# Patient Record
Sex: Female | Born: 1975 | Race: White | Hispanic: No | Marital: Married | Smoking: Never smoker
Health system: Southern US, Community
[De-identification: ages and names within clinical notes are randomized; demographics above are authoritative.]

## PROBLEM LIST (undated history)

## (undated) DIAGNOSIS — F419 Anxiety disorder, unspecified: Secondary | ICD-10-CM

## (undated) DIAGNOSIS — E119 Type 2 diabetes mellitus without complications: Secondary | ICD-10-CM

## (undated) DIAGNOSIS — T8859XA Other complications of anesthesia, initial encounter: Secondary | ICD-10-CM

## (undated) DIAGNOSIS — N189 Chronic kidney disease, unspecified: Secondary | ICD-10-CM

## (undated) DIAGNOSIS — E039 Hypothyroidism, unspecified: Secondary | ICD-10-CM

## (undated) DIAGNOSIS — M1990 Unspecified osteoarthritis, unspecified site: Secondary | ICD-10-CM

## (undated) DIAGNOSIS — R51 Headache: Secondary | ICD-10-CM

## (undated) DIAGNOSIS — Z3480 Encounter for supervision of other normal pregnancy, unspecified trimester: Principal | ICD-10-CM

## (undated) HISTORY — PX: KIDNEY SURGERY: SHX687

## (undated) HISTORY — PX: TONSILLECTOMY: SUR1361

## (undated) HISTORY — PX: CERVICAL ABLATION: SHX5771

---

## 1998-10-25 ENCOUNTER — Other Ambulatory Visit: Admission: RE | Admit: 1998-10-25 | Discharge: 1998-10-25 | Payer: Self-pay | Admitting: Family Medicine

## 1999-11-25 ENCOUNTER — Other Ambulatory Visit: Admission: RE | Admit: 1999-11-25 | Discharge: 1999-11-25 | Payer: Self-pay | Admitting: Family Medicine

## 2000-10-15 ENCOUNTER — Other Ambulatory Visit: Admission: RE | Admit: 2000-10-15 | Discharge: 2000-10-15 | Payer: Self-pay | Admitting: Family Medicine

## 2001-10-19 ENCOUNTER — Other Ambulatory Visit: Admission: RE | Admit: 2001-10-19 | Discharge: 2001-10-19 | Payer: Self-pay | Admitting: Family Medicine

## 2002-10-17 ENCOUNTER — Other Ambulatory Visit: Admission: RE | Admit: 2002-10-17 | Discharge: 2002-10-17 | Payer: Self-pay | Admitting: Obstetrics and Gynecology

## 2005-06-23 ENCOUNTER — Ambulatory Visit: Admission: RE | Admit: 2005-06-23 | Discharge: 2005-06-23 | Payer: Self-pay | Admitting: Orthopedic Surgery

## 2009-06-25 ENCOUNTER — Ambulatory Visit (HOSPITAL_COMMUNITY): Payer: Self-pay | Admitting: Psychology

## 2009-08-01 ENCOUNTER — Ambulatory Visit (HOSPITAL_COMMUNITY): Payer: Self-pay | Admitting: Psychology

## 2009-08-16 ENCOUNTER — Ambulatory Visit (HOSPITAL_COMMUNITY): Payer: Self-pay | Admitting: Psychology

## 2009-09-04 ENCOUNTER — Ambulatory Visit (HOSPITAL_COMMUNITY): Payer: Self-pay | Admitting: Psychology

## 2009-10-16 ENCOUNTER — Ambulatory Visit (HOSPITAL_COMMUNITY): Payer: Self-pay | Admitting: Psychology

## 2009-11-12 ENCOUNTER — Ambulatory Visit (HOSPITAL_COMMUNITY): Payer: Self-pay | Admitting: Psychology

## 2009-11-14 ENCOUNTER — Encounter: Admission: RE | Admit: 2009-11-14 | Discharge: 2010-01-30 | Payer: Self-pay | Admitting: Orthopedic Surgery

## 2009-12-13 ENCOUNTER — Ambulatory Visit (HOSPITAL_COMMUNITY): Payer: Self-pay | Admitting: Psychology

## 2010-02-12 ENCOUNTER — Ambulatory Visit (HOSPITAL_COMMUNITY): Payer: Self-pay | Admitting: Psychology

## 2011-12-21 ENCOUNTER — Other Ambulatory Visit (HOSPITAL_COMMUNITY): Payer: Self-pay | Admitting: Urology

## 2011-12-21 DIAGNOSIS — N135 Crossing vessel and stricture of ureter without hydronephrosis: Secondary | ICD-10-CM

## 2012-01-25 ENCOUNTER — Inpatient Hospital Stay (HOSPITAL_COMMUNITY)
Admission: RE | Admit: 2012-01-25 | Discharge: 2012-01-25 | Payer: BC Managed Care – PPO | Source: Ambulatory Visit | Attending: Urology | Admitting: Urology

## 2012-02-09 ENCOUNTER — Encounter (HOSPITAL_COMMUNITY)
Admission: RE | Admit: 2012-02-09 | Discharge: 2012-02-09 | Disposition: A | Discharge status: Home / Self Care | Payer: BC Managed Care – PPO | Source: Ambulatory Visit | Attending: Urology | Admitting: Urology

## 2012-02-09 DIAGNOSIS — R109 Unspecified abdominal pain: Secondary | ICD-10-CM | POA: Insufficient documentation

## 2012-02-09 DIAGNOSIS — N135 Crossing vessel and stricture of ureter without hydronephrosis: Secondary | ICD-10-CM

## 2012-02-09 MED ORDER — FUROSEMIDE 10 MG/ML IJ SOLN
40.0000 mg | Freq: Once | INTRAMUSCULAR | Status: AC
  Administered 2012-02-09: 52 mg via INTRAVENOUS
  Filled 2012-02-09: qty 4

## 2012-05-30 ENCOUNTER — Other Ambulatory Visit: Payer: Self-pay | Admitting: Physical Medicine and Rehabilitation

## 2012-05-30 DIAGNOSIS — M5414 Radiculopathy, thoracic region: Secondary | ICD-10-CM

## 2012-06-03 ENCOUNTER — Ambulatory Visit
Admission: RE | Admit: 2012-06-03 | Discharge: 2012-06-03 | Disposition: A | Discharge status: Home / Self Care | Payer: BC Managed Care – PPO | Source: Ambulatory Visit | Attending: Physical Medicine and Rehabilitation | Admitting: Physical Medicine and Rehabilitation

## 2012-06-03 DIAGNOSIS — M5414 Radiculopathy, thoracic region: Secondary | ICD-10-CM

## 2012-09-20 ENCOUNTER — Ambulatory Visit (INDEPENDENT_AMBULATORY_CARE_PROVIDER_SITE_OTHER): Payer: BC Managed Care – PPO | Admitting: Family Medicine

## 2012-09-20 ENCOUNTER — Telehealth: Payer: Self-pay | Admitting: Family Medicine

## 2012-09-20 ENCOUNTER — Encounter: Payer: Self-pay | Admitting: Family Medicine

## 2012-09-20 VITALS — BP 114/72 | HR 66 | Temp 96.7°F | Ht 62.0 in | Wt 207.0 lb

## 2012-09-20 DIAGNOSIS — J029 Acute pharyngitis, unspecified: Secondary | ICD-10-CM

## 2012-09-20 LAB — POCT CBC
Granulocyte percent: 67.5 %G (ref 37–80)
HCT, POC: 40 % (ref 37.7–47.9)
Hemoglobin: 13.9 g/dL (ref 12.2–16.2)
Lymph, poc: 2.3 (ref 0.6–3.4)
MCH, POC: 31.1 pg (ref 27–31.2)
MCHC: 34.9 g/dL (ref 31.8–35.4)
MCV: 89 fL (ref 80–97)
MPV: 8 fL (ref 0–99.8)
POC Granulocyte: 5.5 (ref 2–6.9)
POC LYMPH PERCENT: 28.2 %L (ref 10–50)
Platelet Count, POC: 237 10*3/uL (ref 142–424)
RBC: 4.5 M/uL (ref 4.04–5.48)
RDW, POC: 12.3 %
WBC: 8.2 10*3/uL (ref 4.6–10.2)

## 2012-09-20 LAB — POCT RAPID STREP A (OFFICE): Rapid Strep A Screen: NEGATIVE

## 2012-09-20 NOTE — Progress Notes (Signed)
Patient ID: Beyonce Sawatzky, female   DOB: 03-02-1976, 37 y.o.   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  PMH/PSH: reviewed/updated in Epic  SH/FH: reviewed/updated in Epic  Allergies: reviewed/updated in Epic  Medications: reviewed/updated in Epic  Immunizations: reviewed/updated in Epic  ROS: As above in the HPI. All other systems are stable or negative.  OBJECTIVE: APPEARANCE:  Patient in no acute distress.The patient appeared well nourished and normally developed. Acyanotic. Waist: VITAL SIGNS:BP 114/72  Pulse 66  Temp(Src) 96.7 F (35.9 C) (Oral)  Ht 5\' 2"  (1.575 m)  Wt 207 lb (93.895 kg)  BMI 37.85 kg/m2  LMP 08/31/2012 obese  SKIN: warm and  Dry without overt rashes, tattoos and scars  HEAD and Neck: without JVD, Head and scalp: normal Eyes:No scleral icterus. Fundi normal, eye movements normal. Ears: Auricle normal, canal normal, Tympanic membranes normal, insufflation normal. Nose: normal Throat:very red looks like red "flames" Neck & thyroid: normal  CHEST & LUNGS: Chest wall: normal Lungs: Clear  CVS: Reveals the PMI to be normally located. Regular rhythm, First and Second Heart sounds are normal,  absence of murmurs, rubs or gallops. Peripheral vasculature: Radial pulses: normal Dorsal pedis pulses: normal Posterior pulses: normal  ABDOMEN:  Appearance:obese Benign,, no organomegaly, no masses, no Abdominal Aortic enlargement. No Guarding , no rebound. No Bruits. Bowel sounds: normal  RECTAL: N/A GU: N/A  EXTREMETIES: nonedematous. Both Femoral and Pedal pulses are normal.  MUSCULOSKELETAL:  Spine: normal Joints: intact  NEUROLOGIC: oriented to time,place and person; nonfocal. Strength is normal Sensory is normal Reflexes are normal Cranial Nerves are normal.  ASSESSMENT: Acute pharyngitis - Plan: POCT rapid strep A, POCT CBC    PLAN: Orders Placed This  Encounter  Procedures  . POCT rapid strep A  . POCT CBC   Results for orders placed in visit on 09/20/12  POCT RAPID STREP A (OFFICE)      Result Value Range   Rapid Strep A Screen Negative  Negative  POCT CBC      Result Value Range   WBC 8.2  4.6 - 10.2 K/uL   Lymph, poc 2.3  0.6 - 3.4   POC LYMPH PERCENT 28.2  10 - 50 %L   POC Granulocyte 5.5  2 - 6.9   Granulocyte percent 67.5  37 - 80 %G   RBC 4.5  4.04 - 5.48 M/uL   Hemoglobin 13.9  12.2 - 16.2 g/dL   HCT, POC 14.7  82.9 - 47.9 %   MCV 89.0  80 - 97 fL   MCH, POC 31.1  27 - 31.2 pg   MCHC 34.9  31.8 - 35.4 g/dL   RDW, POC 56.2     Platelet Count, POC 237.0  142 - 424 K/uL   MPV 8.0  0 - 99.8 fL   Saline gargles Lozenges: samples of cepacol given.  RTC prn  Cady Hafen P. [place] Charon, M.D.

## 2012-09-20 NOTE — Telephone Encounter (Signed)
appt made

## 2012-10-31 ENCOUNTER — Ambulatory Visit: Payer: BC Managed Care – PPO | Attending: Orthopedic Surgery | Admitting: Physical Therapy

## 2012-10-31 DIAGNOSIS — R5381 Other malaise: Secondary | ICD-10-CM | POA: Insufficient documentation

## 2012-10-31 DIAGNOSIS — M25579 Pain in unspecified ankle and joints of unspecified foot: Secondary | ICD-10-CM | POA: Insufficient documentation

## 2012-10-31 DIAGNOSIS — IMO0001 Reserved for inherently not codable concepts without codable children: Secondary | ICD-10-CM | POA: Insufficient documentation

## 2012-11-01 ENCOUNTER — Ambulatory Visit: Payer: BC Managed Care – PPO | Attending: Orthopedic Surgery | Admitting: *Deleted

## 2012-11-01 DIAGNOSIS — IMO0001 Reserved for inherently not codable concepts without codable children: Secondary | ICD-10-CM | POA: Insufficient documentation

## 2012-11-01 DIAGNOSIS — R5381 Other malaise: Secondary | ICD-10-CM | POA: Insufficient documentation

## 2012-11-01 DIAGNOSIS — M25579 Pain in unspecified ankle and joints of unspecified foot: Secondary | ICD-10-CM | POA: Insufficient documentation

## 2012-11-07 ENCOUNTER — Ambulatory Visit: Payer: BC Managed Care – PPO | Admitting: Physical Therapy

## 2012-11-09 ENCOUNTER — Ambulatory Visit: Payer: BC Managed Care – PPO | Admitting: Physical Therapy

## 2012-11-14 ENCOUNTER — Ambulatory Visit: Payer: BC Managed Care – PPO | Admitting: Physical Therapy

## 2012-11-16 ENCOUNTER — Ambulatory Visit: Payer: BC Managed Care – PPO | Admitting: Physical Therapy

## 2012-11-21 ENCOUNTER — Ambulatory Visit: Payer: BC Managed Care – PPO | Admitting: *Deleted

## 2013-09-23 ENCOUNTER — Inpatient Hospital Stay (HOSPITAL_COMMUNITY)
Admission: AD | Admit: 2013-09-23 | Discharge: 2013-09-23 | Disposition: A | Discharge status: Home / Self Care | Payer: BC Managed Care – PPO | Source: Ambulatory Visit | Attending: Obstetrics and Gynecology | Admitting: Obstetrics and Gynecology

## 2013-09-23 ENCOUNTER — Encounter (HOSPITAL_COMMUNITY): Payer: Self-pay | Admitting: *Deleted

## 2013-09-23 DIAGNOSIS — O34219 Maternal care for unspecified type scar from previous cesarean delivery: Secondary | ICD-10-CM | POA: Insufficient documentation

## 2013-09-23 DIAGNOSIS — O99891 Other specified diseases and conditions complicating pregnancy: Secondary | ICD-10-CM | POA: Insufficient documentation

## 2013-09-23 DIAGNOSIS — R109 Unspecified abdominal pain: Secondary | ICD-10-CM | POA: Insufficient documentation

## 2013-09-23 DIAGNOSIS — O9989 Other specified diseases and conditions complicating pregnancy, childbirth and the puerperium: Principal | ICD-10-CM

## 2013-09-23 MED ORDER — LACTATED RINGERS IV BOLUS (SEPSIS)
500.0000 mL | Freq: Once | INTRAVENOUS | Status: AC
  Administered 2013-09-23: 500 mL via INTRAVENOUS

## 2013-09-23 MED ORDER — OXYCODONE-ACETAMINOPHEN 5-325 MG PO TABS
1.0000 | ORAL_TABLET | Freq: Once | ORAL | Status: AC
  Administered 2013-09-23: 1 via ORAL
  Filled 2013-09-23: qty 1

## 2013-09-23 NOTE — MAU Note (Signed)
PT SAYS SHE STARTED HURTING BAD AT 10PM.  VE ON Thursday- 2 CM.    IS [facility]  REPEAT C/S ON  6-11.    DENIES HSV AND MRSA.    GBS- NEG.

## 2013-10-03 ENCOUNTER — Encounter (HOSPITAL_COMMUNITY): Payer: Self-pay | Admitting: Pharmacy Technician

## 2013-10-11 ENCOUNTER — Encounter (HOSPITAL_COMMUNITY): Payer: Self-pay

## 2013-10-11 ENCOUNTER — Encounter (HOSPITAL_COMMUNITY)
Admission: RE | Admit: 2013-10-11 | Discharge: 2013-10-11 | Disposition: A | Discharge status: Home / Self Care | Payer: BC Managed Care – PPO | Source: Ambulatory Visit | Attending: Obstetrics and Gynecology | Admitting: Obstetrics and Gynecology

## 2013-10-11 ENCOUNTER — Encounter (HOSPITAL_COMMUNITY): Payer: Self-pay | Admitting: Obstetrics and Gynecology

## 2013-10-11 DIAGNOSIS — Z3480 Encounter for supervision of other normal pregnancy, unspecified trimester: Secondary | ICD-10-CM

## 2013-10-11 HISTORY — DX: Unspecified osteoarthritis, unspecified site: M19.90

## 2013-10-11 HISTORY — DX: Headache: R51

## 2013-10-11 HISTORY — DX: Chronic kidney disease, unspecified: N18.9

## 2013-10-11 HISTORY — DX: Type 2 diabetes mellitus without complications: E11.9

## 2013-10-11 HISTORY — DX: Encounter for supervision of other normal pregnancy, unspecified trimester: Z34.80

## 2013-10-11 LAB — ABO/RH: ABO/RH(D): AB NEG

## 2013-10-11 LAB — CBC
HCT: 36.9 % (ref 36.0–46.0)
HEMOGLOBIN: 12.6 g/dL (ref 12.0–15.0)
MCH: 30.9 pg (ref 26.0–34.0)
MCHC: 34.1 g/dL (ref 30.0–36.0)
MCV: 90.4 fL (ref 78.0–100.0)
Platelets: 222 10*3/uL (ref 150–400)
RBC: 4.08 MIL/uL (ref 3.87–5.11)
RDW: 13.3 % (ref 11.5–15.5)
WBC: 11.5 10*3/uL — AB (ref 4.0–10.5)

## 2013-10-11 LAB — RPR

## 2013-10-11 MED ORDER — GENTAMICIN SULFATE 40 MG/ML IJ SOLN
INTRAMUSCULAR | Status: DC
  Filled 2013-10-11: qty 9

## 2013-10-11 NOTE — H&P (Signed)
Christy Gay is a 38 y.o. female G2P1001 at 39+ for rLTCS/BTL.  Relatively uncomplicated prenatal care.  +FM, no LOF, no VB, occ ctx.  Elevated glucola - normal 3 hr GTT.  First LTCS for CPD, declines VBAC.  Undesired fertility.   Maternal Medical History:  Contractions: Frequency: irregular.    Fetal activity: Perceived fetal activity is normal.    Prenatal Complications - Diabetes: none.    OB History   Grav Para Term Preterm Abortions TAB SAB Ect Mult Living   2 1 1       1    G1 LTCS CPD 7#6, female, GDM G2 present No abn pap, no STD  Past Medical History  Diagnosis Date  . Diabetes mellitus without complication     gestational with first pregnancy  . Headache(784.0)     migraines  . Arthritis     neck, upper back  . 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 Normal pregnancy, repeat 10/11/2013   Past Surgical History  Procedure Laterality Date  . Tonsillectomy    . Cesarean section    . Kidney surgery     Family History: CAD, DM, HTN, Breast Cancer, Alcoholism Social History:  reports that she has never smoked. She has never used smokeless tobacco. 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   HENT: Negative.   Eyes: Negative.   Respiratory: Negative.   Cardiovascular: Negative.   Gastrointestinal: Negative.   Genitourinary: Negative.   Musculoskeletal: Negative.   Skin: Negative.   Neurological: Negative.   Psychiatric/Behavioral: Negative.       There were no vitals taken for this visit. Maternal Exam:  Abdomen: Surgical  scars: low transverse.   Fundal height is appropriate for gestation.   Estimated fetal weight is 7-8#.   Fetal presentation: vertex  Introitus: Normal vulva. Normal vagina.  Pelvis: questionable for delivery.      Physical Exam  Constitutional: She is oriented to person, place, and time. She appears well-developed and well-nourished.  HENT:  Head: Normocephalic and atraumatic.  Cardiovascular: Normal rate and regular rhythm.   Respiratory: Effort normal and breath sounds normal. No respiratory distress. She has no wheezes.  GI: Soft. Bowel sounds are normal. She exhibits no distension. There is no tenderness.  Musculoskeletal: Normal range of motion.  Neurological: She is alert and oriented to person, place, and time.  Skin: Skin is warm and dry.  Psychiatric: She has a normal mood and affect. Her behavior is normal.    Prenatal labs: ABO, Rh: --/--/AB NEG, AB NEG (06/10 1005) Antibody: NEG (06/10 1005) Rubella:  immune RPR: NON REAC (06/10 1005)  HBsAg:   neg HIV:   neg GBS:   neg  Hgb 13.1/, Ur Cx +/GC neg/ Chl neg/declined genetic screening/glucola 137 - 3hr GTT  Dated by 1st Tri US EDC 10/14/13  Tdap 08/03/13 Flu 04/11/13 US nl anat, low-lying plac, female F/u placenta resolved Assessment/Plan: 38yo G2P1001 for rLTCS/BTL by B salpingectomy D/w pt r/b/a of surgery Gent/Clinda for prophlaxis   [name], Maple Odaniel 10/11/2013, 8:30 PM     

## 2013-10-11 NOTE — Patient Instructions (Addendum)
Your procedure is scheduled on: Thursday, October 12, 2013  Enter through the [employer] [facility] at: 8:00am  Pick up the phone at the desk and dial [phone].  Call this number if you have problems the morning of surgery: [phone].  Remember: Do NOT eat food: After midnight tonight Do NOT drink clear liquids after:  After midnight tonight Take these medicines the morning of surgery with a SIP OF WATER: None  Do NOT wear jewelry (body piercing), metal hair clips/bobby pins, make-up, or nail polish. Do NOT wear lotions, powders, or perfumes.  You may wear deoderant. Do NOT shave for 48 hours prior to surgery. Do NOT bring valuables to the hospital. Contacts, dentures, or bridgework may not be worn into surgery. Leave suitcase in car.  After surgery it may be brought to your room.  For patients admitted to the hospital, checkout time is 11:00 AM the day of discharge.

## 2013-10-12 ENCOUNTER — Inpatient Hospital Stay (HOSPITAL_COMMUNITY)
Admission: RE | Admit: 2013-10-12 | Discharge: 2013-10-14 | DRG: 765 | Disposition: A | Discharge status: Home / Self Care | Payer: BC Managed Care – PPO | Source: Ambulatory Visit | Attending: Obstetrics and Gynecology | Admitting: Obstetrics and Gynecology

## 2013-10-12 ENCOUNTER — Inpatient Hospital Stay (HOSPITAL_COMMUNITY)
Admission: RE | Admit: 2013-10-12 | Payer: BC Managed Care – PPO | Source: Ambulatory Visit | Admitting: Obstetrics and Gynecology

## 2013-10-12 ENCOUNTER — Encounter (HOSPITAL_COMMUNITY)
Admission: RE | Disposition: A | Discharge status: Home / Self Care | Payer: Self-pay | Source: Ambulatory Visit | Attending: Obstetrics and Gynecology

## 2013-10-12 ENCOUNTER — Encounter (HOSPITAL_COMMUNITY): Payer: BC Managed Care – PPO | Admitting: Anesthesiology

## 2013-10-12 ENCOUNTER — Encounter (HOSPITAL_COMMUNITY): Payer: Self-pay | Admitting: *Deleted

## 2013-10-12 ENCOUNTER — Inpatient Hospital Stay (HOSPITAL_COMMUNITY): Payer: BC Managed Care – PPO | Admitting: Anesthesiology

## 2013-10-12 DIAGNOSIS — Z833 Family history of diabetes mellitus: Secondary | ICD-10-CM

## 2013-10-12 DIAGNOSIS — Z302 Encounter for sterilization: Secondary | ICD-10-CM

## 2013-10-12 DIAGNOSIS — Z98891 History of uterine scar from previous surgery: Secondary | ICD-10-CM

## 2013-10-12 DIAGNOSIS — O09529 Supervision of elderly multigravida, unspecified trimester: Secondary | ICD-10-CM | POA: Diagnosis present

## 2013-10-12 DIAGNOSIS — O34219 Maternal care for unspecified type scar from previous cesarean delivery: Principal | ICD-10-CM | POA: Diagnosis present

## 2013-10-12 DIAGNOSIS — E669 Obesity, unspecified: Secondary | ICD-10-CM | POA: Diagnosis present

## 2013-10-12 DIAGNOSIS — Z6841 Body Mass Index (BMI) 40.0 and over, adult: Secondary | ICD-10-CM

## 2013-10-12 DIAGNOSIS — Z803 Family history of malignant neoplasm of breast: Secondary | ICD-10-CM

## 2013-10-12 DIAGNOSIS — O99214 Obesity complicating childbirth: Secondary | ICD-10-CM

## 2013-10-12 DIAGNOSIS — O26839 Pregnancy related renal disease, unspecified trimester: Secondary | ICD-10-CM | POA: Diagnosis present

## 2013-10-12 DIAGNOSIS — Z8249 Family history of ischemic heart disease and other diseases of the circulatory system: Secondary | ICD-10-CM

## 2013-10-12 DIAGNOSIS — N289 Disorder of kidney and ureter, unspecified: Secondary | ICD-10-CM | POA: Diagnosis present

## 2013-10-12 HISTORY — DX: Encounter for supervision of other normal pregnancy, unspecified trimester: Z34.80

## 2013-10-12 HISTORY — DX: History of uterine scar from previous surgery: Z98.891

## 2013-10-12 HISTORY — PX: TUBAL LIGATION: SHX77

## 2013-10-12 LAB — PREPARE RBC (CROSSMATCH)

## 2013-10-12 SURGERY — Surgical Case
Anesthesia: Spinal | Site: Abdomen

## 2013-10-12 SURGERY — Surgical Case
Anesthesia: Regional

## 2013-10-12 MED ORDER — DIPHENHYDRAMINE HCL 25 MG PO CAPS
25.0000 mg | ORAL_CAPSULE | ORAL | Status: DC | PRN
  Filled 2013-10-12: qty 1

## 2013-10-12 MED ORDER — HYDROMORPHONE HCL PF 1 MG/ML IJ SOLN: 0.2500 mg | INTRAMUSCULAR | Status: DC | PRN

## 2013-10-12 MED ORDER — OXYTOCIN 10 UNIT/ML IJ SOLN
40.0000 [IU] | INTRAVENOUS | Status: DC | PRN
  Administered 2013-10-12: 40 [IU] via INTRAVENOUS

## 2013-10-12 MED ORDER — METOCLOPRAMIDE HCL 5 MG/ML IJ SOLN: 10.0000 mg | Freq: Three times a day (TID) | INTRAMUSCULAR | Status: DC | PRN

## 2013-10-12 MED ORDER — ONDANSETRON HCL 4 MG/2ML IJ SOLN: 4.0000 mg | Freq: Three times a day (TID) | INTRAMUSCULAR | Status: DC | PRN

## 2013-10-12 MED ORDER — DIBUCAINE 1 % RE OINT: 1.0000 "application " | TOPICAL_OINTMENT | RECTAL | Status: DC | PRN

## 2013-10-12 MED ORDER — SCOPOLAMINE 1 MG/3DAYS TD PT72
MEDICATED_PATCH | TRANSDERMAL | Status: AC
  Administered 2013-10-12: 1.5 mg via NASAL
  Filled 2013-10-12: qty 1

## 2013-10-12 MED ORDER — FENTANYL CITRATE 0.05 MG/ML IJ SOLN
INTRAMUSCULAR | Status: DC | PRN
  Administered 2013-10-12: 25 ug via INTRATHECAL
  Administered 2013-10-12: 25 ug via INTRAVENOUS

## 2013-10-12 MED ORDER — SCOPOLAMINE 1 MG/3DAYS TD PT72: 1.0000 | MEDICATED_PATCH | Freq: Once | TRANSDERMAL | Status: DC

## 2013-10-12 MED ORDER — KETOROLAC TROMETHAMINE 30 MG/ML IJ SOLN
30.0000 mg | Freq: Four times a day (QID) | INTRAMUSCULAR | Status: AC | PRN
  Administered 2013-10-12: 30 mg via INTRAMUSCULAR

## 2013-10-12 MED ORDER — LACTATED RINGERS IV SOLN
INTRAVENOUS | Status: DC
Start: 2013-10-12 — End: 2013-10-12

## 2013-10-12 MED ORDER — NALBUPHINE HCL 10 MG/ML IJ SOLN
5.0000 mg | INTRAMUSCULAR | Status: DC | PRN
  Administered 2013-10-12: 10 mg via INTRAVENOUS
  Filled 2013-10-12: qty 1

## 2013-10-12 MED ORDER — SIMETHICONE 80 MG PO CHEW: 80.0000 mg | CHEWABLE_TABLET | ORAL | Status: DC | PRN

## 2013-10-12 MED ORDER — IBUPROFEN 800 MG PO TABS
800.0000 mg | ORAL_TABLET | Freq: Three times a day (TID) | ORAL | Status: DC
  Administered 2013-10-12 – 2013-10-14 (×7): 800 mg via ORAL
  Filled 2013-10-12 (×7): qty 1

## 2013-10-12 MED ORDER — NALOXONE HCL 1 MG/ML IJ SOLN
1.0000 ug/kg/h | INTRAVENOUS | Status: DC | PRN
  Filled 2013-10-12: qty 2

## 2013-10-12 MED ORDER — PRENATAL MULTIVITAMIN CH
1.0000 | ORAL_TABLET | Freq: Every day | ORAL | Status: DC
  Administered 2013-10-13 – 2013-10-14 (×2): 1 via ORAL
  Filled 2013-10-12 (×2): qty 1

## 2013-10-12 MED ORDER — MORPHINE SULFATE (PF) 0.5 MG/ML IJ SOLN
INTRAMUSCULAR | Status: DC | PRN
  Administered 2013-10-12: .15 mg via INTRATHECAL

## 2013-10-12 MED ORDER — KETOROLAC TROMETHAMINE 30 MG/ML IJ SOLN: 30.0000 mg | Freq: Four times a day (QID) | INTRAMUSCULAR | Status: AC | PRN

## 2013-10-12 MED ORDER — PHENYLEPHRINE 8 MG IN D5W 100 ML (0.08MG/ML) PREMIX OPTIME
INJECTION | INTRAVENOUS | Status: DC | PRN
  Administered 2013-10-12: 80 ug/min via INTRAVENOUS

## 2013-10-12 MED ORDER — SIMETHICONE 80 MG PO CHEW
80.0000 mg | CHEWABLE_TABLET | Freq: Three times a day (TID) | ORAL | Status: DC
  Administered 2013-10-12 – 2013-10-14 (×5): 80 mg via ORAL
  Filled 2013-10-12 (×6): qty 1

## 2013-10-12 MED ORDER — OXYTOCIN 40 UNITS IN LACTATED RINGERS INFUSION - SIMPLE MED: 62.5000 mL/h | INTRAVENOUS | Status: AC

## 2013-10-12 MED ORDER — SIMETHICONE 80 MG PO CHEW
80.0000 mg | CHEWABLE_TABLET | ORAL | Status: DC
  Administered 2013-10-13 – 2013-10-14 (×2): 80 mg via ORAL
  Filled 2013-10-12 (×2): qty 1

## 2013-10-12 MED ORDER — PHENYLEPHRINE HCL 10 MG/ML IJ SOLN
INTRAMUSCULAR | Status: DC | PRN
  Administered 2013-10-12 (×2): 80 ug via INTRAVENOUS
  Administered 2013-10-12 (×3): 120 ug via INTRAVENOUS

## 2013-10-12 MED ORDER — SODIUM CHLORIDE 0.9 % IJ SOLN: 3.0000 mL | INTRAMUSCULAR | Status: DC | PRN

## 2013-10-12 MED ORDER — OXYCODONE-ACETAMINOPHEN 5-325 MG PO TABS
1.0000 | ORAL_TABLET | ORAL | Status: DC | PRN
  Administered 2013-10-13 – 2013-10-14 (×5): 1 via ORAL
  Filled 2013-10-12 (×6): qty 1

## 2013-10-12 MED ORDER — LACTATED RINGERS IV SOLN
INTRAVENOUS | Status: DC
  Administered 2013-10-12: 20:00:00 via INTRAVENOUS

## 2013-10-12 MED ORDER — GENTAMICIN SULFATE 40 MG/ML IJ SOLN
INTRAVENOUS | Status: DC | PRN
  Administered 2013-10-12: 115 mL via INTRAVENOUS

## 2013-10-12 MED ORDER — SENNOSIDES-DOCUSATE SODIUM 8.6-50 MG PO TABS
2.0000 | ORAL_TABLET | ORAL | Status: DC
  Administered 2013-10-13 – 2013-10-14 (×2): 2 via ORAL
  Filled 2013-10-12 (×2): qty 2

## 2013-10-12 MED ORDER — DIPHENHYDRAMINE HCL 25 MG PO CAPS: 25.0000 mg | ORAL_CAPSULE | Freq: Four times a day (QID) | ORAL | Status: DC | PRN

## 2013-10-12 MED ORDER — DIPHENHYDRAMINE HCL 50 MG/ML IJ SOLN: 25.0000 mg | INTRAMUSCULAR | Status: DC | PRN

## 2013-10-12 MED ORDER — LACTATED RINGERS IV SOLN
INTRAVENOUS | Status: DC
  Administered 2013-10-12 (×4): via INTRAVENOUS

## 2013-10-12 MED ORDER — ONDANSETRON HCL 4 MG/2ML IJ SOLN: 4.0000 mg | INTRAMUSCULAR | Status: DC | PRN

## 2013-10-12 MED ORDER — LANOLIN HYDROUS EX OINT: 1.0000 "application " | TOPICAL_OINTMENT | CUTANEOUS | Status: DC | PRN

## 2013-10-12 MED ORDER — DIPHENHYDRAMINE HCL 50 MG/ML IJ SOLN: 12.5000 mg | INTRAMUSCULAR | Status: DC | PRN

## 2013-10-12 MED ORDER — ONDANSETRON HCL 4 MG PO TABS: 4.0000 mg | ORAL_TABLET | ORAL | Status: DC | PRN

## 2013-10-12 MED ORDER — NALOXONE HCL 0.4 MG/ML IJ SOLN: 0.4000 mg | INTRAMUSCULAR | Status: DC | PRN

## 2013-10-12 MED ORDER — ONDANSETRON HCL 4 MG/2ML IJ SOLN
INTRAMUSCULAR | Status: DC | PRN
  Administered 2013-10-12: 4 mg via INTRAVENOUS

## 2013-10-12 MED ORDER — NALBUPHINE HCL 10 MG/ML IJ SOLN
5.0000 mg | INTRAMUSCULAR | Status: DC | PRN
  Administered 2013-10-12 – 2013-10-13 (×2): 5 mg via SUBCUTANEOUS
  Filled 2013-10-12 (×2): qty 1

## 2013-10-12 MED ORDER — MEPERIDINE HCL 25 MG/ML IJ SOLN: 6.2500 mg | INTRAMUSCULAR | Status: DC | PRN

## 2013-10-12 MED ORDER — WITCH HAZEL-GLYCERIN EX PADS: 1.0000 "application " | MEDICATED_PAD | CUTANEOUS | Status: DC | PRN

## 2013-10-12 MED ORDER — ZOLPIDEM TARTRATE 5 MG PO TABS: 5.0000 mg | ORAL_TABLET | Freq: Every evening | ORAL | Status: DC | PRN

## 2013-10-12 MED ORDER — MENTHOL 3 MG MT LOZG: 1.0000 | LOZENGE | OROMUCOSAL | Status: DC | PRN

## 2013-10-12 MED ORDER — KETOROLAC TROMETHAMINE 30 MG/ML IJ SOLN
INTRAMUSCULAR | Status: AC
  Filled 2013-10-12: qty 1

## 2013-10-12 SURGICAL SUPPLY — 34 items
CLAMP CORD UMBIL (MISCELLANEOUS) IMPLANT
CLOTH BEACON ORANGE TIMEOUT ST (SAFETY) ×4 IMPLANT
CONTAINER PREFILL 10% NBF 15ML (MISCELLANEOUS) IMPLANT
DRAPE LG THREE QUARTER DISP (DRAPES) IMPLANT
DRSG OPSITE POSTOP 4X10 (GAUZE/BANDAGES/DRESSINGS) ×4 IMPLANT
DURAPREP 26ML APPLICATOR (WOUND CARE) ×4 IMPLANT
ELECT REM PT RETURN 9FT ADLT (ELECTROSURGICAL) ×4
ELECTRODE REM PT RTRN 9FT ADLT (ELECTROSURGICAL) ×2 IMPLANT
EXTRACTOR VACUUM M CUP 4 TUBE (SUCTIONS) IMPLANT
EXTRACTOR VACUUM M CUP 4' TUBE (SUCTIONS)
GLOVE BIO SURGEON STRL SZ 6.5 (GLOVE) ×3 IMPLANT
GLOVE BIO SURGEONS STRL SZ 6.5 (GLOVE) ×1
GOWN STRL REUS W/TWL LRG LVL3 (GOWN DISPOSABLE) ×8 IMPLANT
KIT ABG SYR 3ML LUER SLIP (SYRINGE) IMPLANT
NDL HYPO 25X5/8 SAFETYGLIDE (NEEDLE) IMPLANT
NEEDLE HYPO 25X5/8 SAFETYGLIDE (NEEDLE) IMPLANT
NS IRRIG 1000ML POUR BTL (IV SOLUTION) ×4 IMPLANT
PACK C SECTION WH (CUSTOM PROCEDURE TRAY) ×4 IMPLANT
PAD OB MATERNITY 4.3X12.25 (PERSONAL CARE ITEMS) ×4 IMPLANT
RTRCTR C-SECT PINK 25CM LRG (MISCELLANEOUS) ×4 IMPLANT
STAPLER VISISTAT 35W (STAPLE) IMPLANT
SUT MNCRL 0 VIOLET CTX 36 (SUTURE) ×4 IMPLANT
SUT MONOCRYL 0 CTX 36 (SUTURE) ×4
SUT PLAIN 1 NONE 54 (SUTURE) IMPLANT
SUT PLAIN 2 0 XLH (SUTURE) ×4 IMPLANT
SUT VIC AB 0 CT1 27 (SUTURE) ×8
SUT VIC AB 0 CT1 27XBRD ANBCTR (SUTURE) ×4 IMPLANT
SUT VIC AB 2-0 CT1 27 (SUTURE) ×4
SUT VIC AB 2-0 CT1 TAPERPNT 27 (SUTURE) ×2 IMPLANT
SUT VIC AB 4-0 KS 27 (SUTURE) ×2 IMPLANT
SYR BULB IRRIGATION 50ML (SYRINGE) ×4 IMPLANT
TOWEL OR 17X24 6PK STRL BLUE (TOWEL DISPOSABLE) ×4 IMPLANT
TRAY FOLEY CATH 14FR (SET/KITS/TRAYS/PACK) ×4 IMPLANT
WATER STERILE IRR 1000ML POUR (IV SOLUTION) ×4 IMPLANT

## 2013-10-12 NOTE — Lactation Note (Signed)
This note was copied from the chart of Christy Laury Nydegger. Lactation Consultation Note [facility] LC resources given and discussed.  Encouraged to feed with early cues on demand.  Early newborn behavior discussed.  Hand expression demonstrated with colostrum visible. Baby already latched at visit.  Baby has deep wide latch with flanged lips and rhythmic suckling.  Mom denies pain or concerns at this time. Mom to call for assist as needed.    Patient Name: Christy Gay ZDGLO'V Date: 10/12/2013 Reason for consult: Initial assessment   Maternal Data Has patient been taught Hand Expression?: Yes  Feeding Feeding Type: Breast Fed Length of feed: 20 min  LATCH Score/Interventions Latch: Grasps breast easily, tongue down, lips flanged, rhythmical sucking.  Audible Swallowing: A few with stimulation  Type of Nipple: Everted at rest and after stimulation  Comfort (Breast/Nipple): Soft / non-tender     Hold (Positioning): No assistance needed to correctly position infant at breast. Intervention(s): Skin to skin;Support Pillows;Breastfeeding basics reviewed  LATCH Score: 9  Lactation Tools Discussed/Used     Consult Status Consult Status: Follow-up Date: 10/13/13 Follow-up type: In-patient    Beverely Risen Arvella Merles 10/12/2013, 10:55 PM

## 2013-10-12 NOTE — Anesthesia Procedure Notes (Signed)

## 2013-10-12 NOTE — Anesthesia Postprocedure Evaluation (Signed)
  Anesthesia Post-op Note  Patient: Christy Gay  Procedure(s) Performed: Procedure(s): CESAREAN SECTION (N/A) BILATERAL TUBAL LIGATION (Bilateral) Last Vitals:  Filed Vitals:   10/12/13 0819  BP: 105/70  Pulse: 85  Temp: 36.7 C  Resp: 18     Patient is awake, responsive, moving her legs, and has signs of resolution of her numbness. Pain and nausea are reasonably well controlled. Vital signs are stable and clinically acceptable. Oxygen saturation is clinically acceptable. There are no apparent anesthetic complications at this time. Patient is ready for discharge.

## 2013-10-12 NOTE — Transfer of Care (Signed)
Immediate Anesthesia Transfer of Care Note  Patient: Christy Gay  Procedure(s) Performed: Procedure(s): CESAREAN SECTION (N/A) BILATERAL TUBAL LIGATION (Bilateral)  Patient Location: PACU  Anesthesia Type:Spinal  Level of Consciousness: awake, alert  and oriented  Airway & Oxygen Therapy: Patient Spontanous Breathing  Post-op Assessment: Report given to PACU RN and Post -op Vital signs reviewed and stable  Post vital signs: Reviewed and stable  Complications: No apparent anesthesia complications

## 2013-10-12 NOTE — Interval H&P Note (Signed)
History and Physical Interval Note:  10/12/2013 9:10 AM  Christy Gay  has presented today for surgery, with the diagnosis of Rep. Cesarean Section [phone]  The various methods of treatment have been discussed with the patient and family. After consideration of risks, benefits and other options for treatment, the patient has consented to  Procedure(s): CESAREAN SECTION (N/A) as a surgical intervention .  The patient's history has been reviewed, patient examined, no change in status, stable for surgery.  I have reviewed the patient's chart and labs.  Questions were answered to the patient's satisfaction.     [name], Deisha Stull

## 2013-10-12 NOTE — Anesthesia Preprocedure Evaluation (Signed)
Anesthesia Evaluation  Patient identified by MRN, date of birth, ID band Patient awake    Reviewed: Allergy & Precautions, H&P , Patient's Chart, lab work & pertinent test results  Airway Mallampati: II TM Distance: >3 FB Neck ROM: full    Dental no notable dental hx.    Pulmonary  breath sounds clear to auscultation  Pulmonary exam normal       Cardiovascular Exercise Tolerance: Good Rhythm:regular Rate:Normal     Neuro/Psych    GI/Hepatic   Endo/Other  Morbid obesity  Renal/GU      Musculoskeletal   Abdominal   Peds  Hematology   Anesthesia Other Findings   Reproductive/Obstetrics                           Anesthesia Physical Anesthesia Plan  ASA: III  Anesthesia Plan: Spinal   Post-op Pain Management:    Induction:   Airway Management Planned:   Additional Equipment:   Intra-op Plan:   Post-operative Plan:   Informed Consent: I have reviewed the patients History and Physical, chart, labs and discussed the procedure including the risks, benefits and alternatives for the proposed anesthesia with the patient or authorized representative who has indicated his/her understanding and acceptance.   Dental Advisory Given  Plan Discussed with: CRNA  Anesthesia Plan Comments: (Lab work confirmed with CRNA in room. Platelets okay. Discussed spinal anesthetic, and patient consents to the procedure:  included risk of possible headache,backache, failed block, allergic reaction, and nerve injury. This patient was asked if she had any questions or concerns before the procedure started. )       Anesthesia Quick Evaluation  

## 2013-10-12 NOTE — H&P (View-Only) (Signed)
Christy Gay is a 38 y.o. female G2P1001 at 39+ for rLTCS/BTL.  Relatively uncomplicated prenatal care.  +FM, no LOF, no VB, occ ctx.  Elevated glucola - normal 3 hr GTT.  First LTCS for CPD, declines VBAC.  Undesired fertility.   Maternal Medical History:  Contractions: Frequency: irregular.    Fetal activity: Perceived fetal activity is normal.    Prenatal Complications - Diabetes: none.    OB History   Grav Para Term Preterm Abortions TAB SAB Ect Mult Living   2 1 1       1     G1 LTCS CPD 7#6, female, GDM G2 present No abn pap, no STD  Past Medical History  Diagnosis Date  . Diabetes mellitus without complication     gestational with first pregnancy  . Headache(784.0)     migraines  . Arthritis     neck, upper back  . 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 Normal pregnancy, repeat 10/11/2013   Past Surgical History  Procedure Laterality Date  . Tonsillectomy    . Cesarean section    . Kidney surgery     Family History: CAD, DM, HTN, Breast Cancer, Alcoholism Social History:  reports that she has never smoked. She has never used smokeless tobacco. 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   HENT: Negative.   Eyes: Negative.   Respiratory: Negative.   Cardiovascular: Negative.   Gastrointestinal: Negative.   Genitourinary: Negative.   Musculoskeletal: Negative.   Skin: Negative.   Neurological: Negative.   Psychiatric/Behavioral: Negative.       There were no vitals taken for this visit. Maternal Exam:  Abdomen: Surgical  scars: low transverse.   Fundal height is appropriate for gestation.   Estimated fetal weight is 7-8#.   Fetal presentation: vertex  Introitus: Normal vulva. Normal vagina.  Pelvis: questionable for delivery.      Physical Exam  Constitutional: She is oriented to person, place, and time. She appears well-developed and well-nourished.  HENT:  Head: Normocephalic and atraumatic.  Cardiovascular: Normal rate and regular rhythm.   Respiratory: Effort normal and breath sounds normal. No respiratory distress. She has no wheezes.  GI: Soft. Bowel sounds are normal. She exhibits no distension. There is no tenderness.  Musculoskeletal: Normal range of motion.  Neurological: She is alert and oriented to person, place, and time.  Skin: Skin is warm and dry.  Psychiatric: She has a normal mood and affect. Her behavior is normal.    Prenatal labs: ABO, Rh: --/--/AB NEG, AB NEG (06/10 1005) Antibody: NEG (06/10 1005) Rubella:  immune RPR: NON REAC (06/10 1005)  HBsAg:   neg HIV:   neg GBS:   neg  Hgb 13.1/, Ur Cx +/GC neg/ Chl neg/declined genetic screening/glucola 137 - 3hr GTT  Dated by 1st Tri [facility] 10/14/13  Tdap 08/03/13 Flu 04/11/13 [place] nl anat, low-lying plac, female F/u placenta resolved Assessment/Plan: 38yo G2P1001 for rLTCS/BTL by B salpingectomy D/w pt r/b/a of surgery Gent/Clinda for prophlaxis   [name], Rande Dario 10/11/2013, 8:30 PM

## 2013-10-12 NOTE — Brief Op Note (Signed)
10/12/2013  10:31 AM  PATIENT:  Christy Gay  38 y.o. female  PRE-OPERATIVE DIAGNOSIS:  Repeat low transverse cesarean section, bilateral tubal ligation by bilateral salpingectomy  POST-OPERATIVE DIAGNOSIS:  Repeat low transverse cesarean section, bilateral tubal ligation by bilateral salpingectomy  PROCEDURE:  Procedure(s): CESAREAN SECTION (N/A) BILATERAL TUBAL LIGATION (Bilateral)  SURGEON:  Surgeon(s) and Role:    * Sherian Rein, MD - Primary    * Bing Plume, MD - Assisting  FINDINGS: viable female infant at 9:42 apgars 8/9, wt P, nl uterus, tubes and ovaries  ANESTHESIA:   spinal  EBL:  Total I/O In: 2000 [I.V.:2000] Out: 1150 [Urine:150; Blood:1000]  BLOOD ADMINISTERED:none  DRAINS: Urinary Catheter (Foley)   LOCAL MEDICATIONS USED:  NONE  SPECIMEN:  Source of Specimen:  B tubal segments  DISPOSITION OF SPECIMEN:  PATHOLOGY  COUNTS:  YES  TOURNIQUET:  * No tourniquets in log *  DICTATION: .Other Dictation: Dictation Number [id number]  PLAN OF CARE: Admit to inpatient   PATIENT DISPOSITION:  PACU - hemodynamically stable.   Delay start of Pharmacological VTE agent (>24hrs) due to surgical blood loss or risk of bleeding: not applicable

## 2013-10-12 NOTE — Progress Notes (Signed)
Patient ID: Christy Gay, female   DOB: [birth year], 38 y.o.   MRN: [id number] DOS VS normal Output good. Urine yellow. Pt has no complaints except itching

## 2013-10-12 NOTE — Op Note (Signed)
NAMEWILLOW, Christy Gay               ACCOUNT NO.:  [id number]  MEDICAL RECORD NO.:  [id number]  LOCATION:  9110                          FACILITY:  [facility]  PHYSICIAN:  Sherron Monday, MD        DATE OF BIRTH:  Aug 09, 1975  DATE OF PROCEDURE:  10/12/2013 DATE OF DISCHARGE:                              OPERATIVE REPORT   PREOPERATIVE DIAGNOSES:  Intrauterine pregnancy at term, desires repeat cesarean section, declines vaginal birth after cesarean section, undesired fertility.  POSTOPERATIVE DIAGNOSIS:  Intrauterine pregnancy at term, desires repeat cesarean section, declines vaginal birth after cesarean section, undesired fertility, delivered.  PROCEDURE:  Repeat low transverse cesarean section, bilateral tubal ligation, bilateral salpingectomy.  SURGEON:  Sherron Monday, MD.  ASSISTANTMalachi Pro. Ambrose Mantle, M.D.  FINDINGS:  Viable female infant at 9:42 a.m. with Apgars of 8 at 1 minute, 9 at 5 minutes and weight pending at the time of dictation. Normal uterus, tubes, and ovaries are noted.  ANESTHESIA:  Spinal.  IV FLUIDS:  2000 mL.  URINE OUTPUT:  150 mL clear urine at the end of procedure.  EBL:  1000 mL.  PATHOLOGY:  Bilateral tubal segments.  COMPLICATIONS:  None.  DESCRIPTION OF PROCEDURE:  After informed consent was reviewed with the patient including risks, benefits, and alternatives of the surgical procedure.  She was transported to the OR, where spinal anesthesia was placed and found to be adequate.  She was then placed in the supine position with a leftward tilt.  Her pannus was taped to improve visualization.  She was then prepped and draped in the normal sterile fashion.  A Pfannenstiel skin incision was made at the level of her previous incision, carried through the underlying layer of fascia sharply.  The fascia was incised in the midline, the incision was extended with Mayo scissors.  The superior aspect of fascial incision was grasped with Kocher clamps, elevated,  the rectus muscles were dissected off both bluntly and sharply.  Midline was easily identified and the peritoneum was entered bluntly.  This incision was extended superiorly and inferiorly with good visualization of the bladder.  An Alexis skin retractor was placed carefully making sure no bowel was entrapped.  The uterus was then inspected.  A transverse incision was made.  The infant was delivered from vertex presentation without complication.  Nose and mouth were suctioned.  Cord was clamped and cut. Infant was handed off to the waiting pediatric staff.  The placenta was expressed and handed to cord blood collection.  The uterus was cleared of all clots and debris.  The uterine incision was closed with 2 layers of 0 Monocryl, the first of which is running locked and the second is an imbricating layer.  Again, it was affirmed that the patient desires tubal ligation.  The distal ends of the tubes were identified using a Kelly.  The distal one-third was removed, doubly ligated with plain gut suture.  There is a minimal area of bleeding on the left.  This was controlled with a 3-0 Vicryl suture.  A similar procedure was performed on the left.  Hemostasis was assured.  The Alexis skin retractor was removed.  The peritoneum was  reapproximated using 2-0 Vicryl in a running fashion.  The rectus muscles were made hemostatic.  The fascia was closed from either angle overlapping in the midline.  The subcuticular adipose layer was made hemostatic with Bovie cautery, and the dead space was closed using plain gut.  The skin was closed with 4-0 Vicryl on a Keith needle in a subcuticular fashion.  Benzoin and Steri- Strips applied.  Sponge, lap, and needle count was correct x2 per the operating room staff.     Sherron MondayJody Bovard, MD     JB/MEDQ  D:  10/12/2013  T:  10/12/2013  Job:  [id number]

## 2013-10-13 ENCOUNTER — Encounter (HOSPITAL_COMMUNITY): Payer: Self-pay | Admitting: Obstetrics and Gynecology

## 2013-10-13 ENCOUNTER — Telehealth: Payer: Self-pay | Admitting: Nurse Practitioner

## 2013-10-13 LAB — CBC
HEMATOCRIT: 32.5 % — AB (ref 36.0–46.0)
Hemoglobin: 10.9 g/dL — ABNORMAL LOW (ref 12.0–15.0)
MCH: 30.3 pg (ref 26.0–34.0)
MCHC: 33.5 g/dL (ref 30.0–36.0)
MCV: 90.3 fL (ref 78.0–100.0)
Platelets: 182 10*3/uL (ref 150–400)
RBC: 3.6 MIL/uL — ABNORMAL LOW (ref 3.87–5.11)
RDW: 13.4 % (ref 11.5–15.5)
WBC: 10.5 10*3/uL (ref 4.0–10.5)

## 2013-10-13 LAB — CCBB MATERNAL DONOR DRAW

## 2013-10-13 LAB — BIRTH TISSUE RECOVERY COLLECTION (PLACENTA DONATION)

## 2013-10-13 NOTE — Telephone Encounter (Signed)
error 

## 2013-10-13 NOTE — Anesthesia Postprocedure Evaluation (Signed)
  Anesthesia Post-op Note  Patient: Christy BachSarah B Gay  Procedure(s) Performed: Procedure(s): CESAREAN SECTION (N/A) BILATERAL TUBAL LIGATION (Bilateral)  Patient Location: PACU and Mother/Baby  Anesthesia Type:Epidural  Level of Consciousness: awake, alert  and oriented  Airway and Oxygen Therapy: Patient Spontanous Breathing  Post-op Pain: mild  Post-op Assessment: Post-op Vital signs reviewed, Patient's Cardiovascular Status Stable, Respiratory Function Stable, No signs of Nausea or vomiting, Adequate PO intake, Pain level controlled, No headache and No backache  Post-op Vital Signs: Reviewed and stable  Last Vitals:  Filed Vitals:   10/13/13 0600  BP: 101/66  Pulse: 77  Temp: 36.6 C  Resp: 18    Complications: No apparent anesthesia complications

## 2013-10-13 NOTE — Progress Notes (Addendum)
Subjective: Postpartum Day 1: Cesarean Delivery Patient reports incisional pain and tolerating PO.  Nl lochia, pain controlled, just itchy!  Objective: Vital signs in last 24 hours: Temp:  [97.4 F (36.3 C)-98.4 F (36.9 C)] 97.9 F (36.6 C) (06/12 0600) Pulse Rate:  [59-85] 77 (06/12 0600) Resp:  [15-29] 18 (06/12 0600) BP: (90-116)/(56-77) 101/66 mmHg (06/12 0600) SpO2:  [96 %-100 %] 98 % (06/12 0600) Weight:  [115.667 kg (255 lb)] 115.667 kg (255 lb) (06/11 1020)  Physical Exam:  General: alert and no distress Lochia: appropriate Uterine Fundus: firm Incision: healing well, bandage intact - som eblood DVT Evaluation: No evidence of DVT seen on physical exam.   Recent Labs  10/11/13 1005 10/13/13 0600  HGB 12.6 10.9*  HCT 36.9 32.5*    Assessment/Plan: Status post Cesarean section. Doing well postoperatively.  Continue current care.  Routine care.    [name], Jule Schlabach 10/13/2013, 7:47 AM

## 2013-10-13 NOTE — Addendum Note (Signed)
Addendum created 10/13/13 1031 by Elbert Ewingsolleen S Yanet Balliet, CRNA   Modules edited: Notes Section   Notes Section:  File: [id number]

## 2013-10-14 MED ORDER — OXYCODONE-ACETAMINOPHEN 5-325 MG PO TABS: 1.0000 | ORAL_TABLET | ORAL | Status: DC | PRN

## 2013-10-14 MED ORDER — IBUPROFEN 800 MG PO TABS: 800.0000 mg | ORAL_TABLET | Freq: Three times a day (TID) | ORAL | Status: DC

## 2013-10-14 NOTE — Progress Notes (Signed)
POD #2 Doing well, ready to go home Afeb, VSS Abd- soft, fundus firm, dressing intact D/c home

## 2013-10-14 NOTE — Discharge Summary (Signed)
Obstetric Discharge Summary Reason for Admission: cesarean section Prenatal Procedures: none Intrapartum Procedures: cesarean: low cervical, transverse Postpartum Procedures: none Complications-Operative and Postpartum: none Hemoglobin  Date Value Ref Range Status  10/13/2013 10.9* 12.0 - 15.0 g/dL Final  2/44/01025/20/2014 72.513.9  12.2 - 16.2 g/dL Final     HCT  Date Value Ref Range Status  10/13/2013 32.5* 36.0 - 46.0 % Final     HCT, POC  Date Value Ref Range Status  09/20/2012 40.0  37.7 - 47.9 % Final    Physical Exam:  General: alert Lochia: appropriate Uterine Fundus: firm Incision: healing well  Discharge Diagnoses: Term Pregnancy-delivered  Discharge Information: Date: 10/14/2013 Activity: pelvic rest and no strenuous activity Diet: routine Medications: Ibuprofen and Percocet Condition: stable Instructions: refer to practice specific booklet Discharge to: home Follow-up Information   Follow up with Gay, [name], MD. Schedule an appointment as soon as possible for a visit in 2 weeks. (for incision check)    Specialty:  Obstetrics and Gynecology   Contact information:   [address] [place] [address] [phone]       Newborn Data: Live born female  Birth Weight: 7 lb 15 oz (3600 g) APGAR: 8, 9  Home with mother.  Christy Gay 10/14/2013, 9:34 AM

## 2013-10-14 NOTE — Discharge Instructions (Signed)
As per discharge pamphlet °

## 2013-10-15 LAB — TYPE AND SCREEN
ABO/RH(D): AB NEG
ANTIBODY SCREEN: NEGATIVE
UNIT DIVISION: 0
Unit division: 0

## 2013-10-16 MED FILL — Gentamicin Sulfate Inj 40 MG/ML: INTRAMUSCULAR | Qty: 2 | Status: AC

## 2013-10-16 MED FILL — Dextrose Inj 5%: INTRAVENOUS | Qty: 100 | Status: AC

## 2013-11-30 ENCOUNTER — Ambulatory Visit (INDEPENDENT_AMBULATORY_CARE_PROVIDER_SITE_OTHER): Payer: BC Managed Care – PPO | Admitting: Physician Assistant

## 2013-11-30 ENCOUNTER — Encounter: Payer: Self-pay | Admitting: Physician Assistant

## 2013-11-30 VITALS — BP 105/75 | HR 69 | Temp 96.6°F | Ht 62.0 in | Wt 239.0 lb

## 2013-11-30 DIAGNOSIS — M25519 Pain in unspecified shoulder: Secondary | ICD-10-CM

## 2013-11-30 DIAGNOSIS — M25512 Pain in left shoulder: Secondary | ICD-10-CM

## 2013-11-30 NOTE — Progress Notes (Signed)
Subjective:     Patient ID: Christin BachSarah B Giller, female   DOB: 04/05/1976, 38 y.o.   MRN: [id number]  HPI Pt with L shoulder pain x 8 wks She fell forward and caught herself with her L arm Pt noted pain but waited until the end of her preg Pt now post-partum but sx remain  Review of Systems Denies weakness or loss ROM Pain to lateral shoulder No radicular sx Sx worse at night     Objective:   Physical Exam NAD No ecchy/edema FROM of the shoulder + Sx with rest. Lateral/ant abduction Good grip strength Since pt breat feeding offered injection Consent obtained 1cc Marcaine/1cc Kenalog to the L shoulder under sterile techniq     Assessment:     Shoulder pain    Plan:     Heat/Ice Massage Dicussed sx worrisome for rotator cuff tear If sx cont MRI to r/o tear

## 2013-11-30 NOTE — Patient Instructions (Signed)
Rotator Cuff Injury Rotator cuff injury is any type of injury to the set of muscles and tendons that make up the stabilizing unit of your shoulder. This unit holds the ball of your upper arm bone (humerus) in the socket of your shoulder blade (scapula).  CAUSES Injuries to your rotator cuff most commonly come from sports or activities that cause your arm to be moved repeatedly over your head. Examples of this include throwing, weight lifting, swimming, or racquet sports. Long lasting (chronic) irritation of your rotator cuff can cause soreness and swelling (inflammation), bursitis, and eventual damage to your tendons, such as a tear (rupture). SIGNS AND SYMPTOMS Acute rotator cuff tear:  Sudden tearing sensation followed by severe pain shooting from your upper shoulder down your arm toward your elbow.  Decreased range of motion of your shoulder because of pain and muscle spasm.  Severe pain.  Inability to raise your arm out to the side because of pain and loss of muscle power (large tears). Chronic rotator cuff tear:  Pain that usually is worse at night and may interfere with sleep.  Gradual weakness and decreased shoulder motion as the pain worsens.  Decreased range of motion. Rotator cuff tendinitis:  Deep ache in your shoulder and the outside upper arm over your shoulder.  Pain that comes on gradually and becomes worse when lifting your arm to the side or turning it inward. DIAGNOSIS Rotator cuff injury is diagnosed through a medical history, physical exam, and imaging exam. The medical history helps determine the type of rotator cuff injury. Your health care provider will look at your injured shoulder, feel the injured area, and ask you to move your shoulder in different positions. X-ray exams typically are done to rule out other causes of shoulder pain, such as fractures. MRI is the exam of choice for the most severe shoulder injuries because the images show muscles and tendons.    TREATMENT  Chronic tear:  Medicine for pain, such as acetaminophen or ibuprofen.  Physical therapy and range-of-motion exercises may be helpful in maintaining shoulder function and strength.  Steroid injections into your shoulder joint.  Surgical repair of the rotator cuff if the injury does not heal with noninvasive treatment. Acute tear:  Anti-inflammatory medicines such as ibuprofen and naproxen to help reduce pain and swelling.  A sling to help support your arm and rest your rotator cuff muscles. Long-term use of a sling is not advised. It may cause significant stiffening of the shoulder joint.  Surgery may be considered within a few weeks, especially in younger, active people, to return the shoulder to full function.  Indications for surgical treatment include the following:  Age younger than 60 years.  Rotator cuff tears that are complete.  Physical therapy, rest, and anti-inflammatory medicines have been used for 6-8 weeks, with no improvement.  Employment or sporting activity that requires constant shoulder use. Tendinitis:  Anti-inflammatory medicines such as ibuprofen and naproxen to help reduce pain and swelling.  A sling to help support your arm and rest your rotator cuff muscles. Long-term use of a sling is not advised. It may cause significant stiffening of the shoulder joint.  Severe tendinitis may require:  Steroid injections into your shoulder joint.  Physical therapy.  Surgery. HOME CARE INSTRUCTIONS   Apply ice to your injury:  Put ice in a plastic bag.  Place a towel between your skin and the bag.  Leave the ice on for 20 minutes, 2-3 times a day.  If you   have a shoulder immobilizer (sling and straps), wear it until told otherwise by your health care provider.  You may want to sleep on several pillows or in a recliner at night to lessen swelling and pain.  Only take over-the-counter or prescription medicines for pain, discomfort, or fever as  directed by your health care provider.  Do simple hand squeezing exercises with a soft rubber ball to decrease hand swelling. SEEK MEDICAL CARE IF:   Your shoulder pain increases, or new pain or numbness develops in your arm, hand, or fingers.  Your hand or fingers are colder than your other hand. SEEK IMMEDIATE MEDICAL CARE IF:   Your arm, hand, or fingers are numb or tingling.  Your arm, hand, or fingers are increasingly swollen and painful, or they turn white or blue. MAKE SURE YOU:  Understand these instructions.  Will watch your condition.  Will get help right away if you are not doing well or get worse. Document Released: 04/17/2000 Document Revised: 04/25/2013 Document Reviewed: 11/30/2012 ExitCare Patient Information 2015 [employer]. This information is not intended to replace advice given to you by your health care provider. Make sure you discuss any questions you have with your health care provider.  

## 2014-02-06 ENCOUNTER — Telehealth: Payer: Self-pay | Admitting: Physician Assistant

## 2014-02-06 ENCOUNTER — Ambulatory Visit (INDEPENDENT_AMBULATORY_CARE_PROVIDER_SITE_OTHER): Payer: BC Managed Care – PPO | Admitting: Family Medicine

## 2014-02-06 ENCOUNTER — Encounter: Payer: Self-pay | Admitting: Family Medicine

## 2014-02-06 VITALS — BP 111/78 | HR 75 | Temp 98.4°F | Ht 62.0 in | Wt 237.0 lb

## 2014-02-06 DIAGNOSIS — J301 Allergic rhinitis due to pollen: Secondary | ICD-10-CM

## 2014-02-06 DIAGNOSIS — R059 Cough, unspecified: Secondary | ICD-10-CM

## 2014-02-06 DIAGNOSIS — J9801 Acute bronchospasm: Secondary | ICD-10-CM

## 2014-02-06 DIAGNOSIS — R05 Cough: Secondary | ICD-10-CM

## 2014-02-06 LAB — POCT CBC
GRANULOCYTE PERCENT: 66.5 % (ref 37–80)
HEMATOCRIT: 40.6 % (ref 37.7–47.9)
HEMOGLOBIN: 13.4 g/dL (ref 12.2–16.2)
Lymph, poc: 2.7 (ref 0.6–3.4)
MCH, POC: 28.5 pg (ref 27–31.2)
MCHC: 33.1 g/dL (ref 31.8–35.4)
MCV: 86.1 fL (ref 80–97)
MPV: 7.8 fL (ref 0–99.8)
POC Granulocyte: 5.9 (ref 2–6.9)
POC LYMPH PERCENT: 30.1 %L (ref 10–50)
Platelet Count, POC: 287 10*3/uL (ref 142–424)
RBC: 4.7 M/uL (ref 4.04–5.48)
RDW, POC: 12.8 %
WBC: 8.9 10*3/uL (ref 4.6–10.2)

## 2014-02-06 NOTE — Patient Instructions (Signed)
Please drink plenty of fluids. May take Delsym cough syrup 1-2 tsps twice a day for cough. Use a cool mist humidifier. May take Benadryl at night for congestion. Use normal saline nasal spray twice daily for nasl congestion. Return to clinic if symptoms worsen or if you develop a fever.

## 2014-02-06 NOTE — Progress Notes (Signed)
   Subjective:    Patient ID: Christy Gay, female    DOB: 03/05/1976, 38 y.o.   MRN: [id number]  HPI Pt is here today for cough that is worse at night, occasionally productive, started 1 week ago.            Review of Systems  Constitutional: Positive for fatigue.  Respiratory: Positive for cough (occasionally productive, worse at night). Negative for chest tightness and shortness of breath.    BP 111/78  Pulse 75  Temp(Src) 98.4 F (36.9 C) (Oral)  Ht 5\' 2"  (1.575 m)  Wt 237 lb (107.502 kg)  BMI 43.34 kg/m2  LMP 01/29/2014  Breastfeeding? Yes     Objective:   Physical Exam  Nursing note and vitals reviewed. Constitutional: She is oriented to person, place, and time. She appears well-developed and well-nourished. No distress.  HENT:  Head: Normocephalic and atraumatic.  Right Ear: External ear normal.  Left Ear: External ear normal.  Mouth/Throat: Oropharynx is clear and moist. No oropharyngeal exudate.  Nasal congestion bilaterally  Eyes: Conjunctivae and EOM are normal. Pupils are equal, round, and reactive to light. Right eye exhibits no discharge. Left eye exhibits no discharge. No scleral icterus.  Neck: Normal range of motion. Neck supple. No thyromegaly present.  Cardiovascular: Normal rate, regular rhythm and normal heart sounds.   No murmur heard. Pulmonary/Chest: Effort normal and breath sounds normal. No respiratory distress. She has no wheezes. She has no rales. She exhibits no tenderness.  Dry, cough  Musculoskeletal: Normal range of motion. She exhibits no edema.  Neurological: She is alert and oriented to person, place, and time.  Skin: Skin is warm. No rash noted.  Psychiatric: She has a normal mood and affect. Her behavior is normal. Judgment and thought content normal.          Assessment & Plan:   1. Cough - POCT CBC  2. Bronchospasm  3. Allergic rhinitis due to pollen   Patient Instructions  Please drink plenty of fluids. May  take Delsym cough syrup 1-2 tsps twice a day for cough. Use a cool mist humidifier. May take Benadryl at night for congestion. Use normal saline nasal spray twice daily for nasl congestion. Return to clinic if symptoms worsen or if you develop a fever.   Nyra Capeson W. Brant Peets MD

## 2014-02-06 NOTE — Telephone Encounter (Signed)
appt scheduled. Left message on voicemail

## 2014-02-28 ENCOUNTER — Ambulatory Visit (INDEPENDENT_AMBULATORY_CARE_PROVIDER_SITE_OTHER): Payer: BC Managed Care – PPO

## 2014-02-28 DIAGNOSIS — Z23 Encounter for immunization: Secondary | ICD-10-CM

## 2014-03-05 ENCOUNTER — Encounter: Payer: Self-pay | Admitting: Family Medicine

## 2014-04-03 DIAGNOSIS — N943 Premenstrual tension syndrome: Secondary | ICD-10-CM | POA: Insufficient documentation

## 2014-04-16 ENCOUNTER — Ambulatory Visit: Payer: BC Managed Care – PPO | Admitting: Physical Therapy

## 2014-04-24 ENCOUNTER — Ambulatory Visit: Payer: BC Managed Care – PPO | Attending: Orthopedic Surgery | Admitting: Physical Therapy

## 2014-04-24 DIAGNOSIS — M25512 Pain in left shoulder: Secondary | ICD-10-CM | POA: Insufficient documentation

## 2014-04-25 ENCOUNTER — Ambulatory Visit: Payer: BC Managed Care – PPO | Admitting: Physical Therapy

## 2014-04-25 DIAGNOSIS — M25512 Pain in left shoulder: Secondary | ICD-10-CM | POA: Diagnosis not present

## 2014-05-01 ENCOUNTER — Ambulatory Visit: Payer: BC Managed Care – PPO | Admitting: Physical Therapy

## 2014-05-01 DIAGNOSIS — M25512 Pain in left shoulder: Secondary | ICD-10-CM | POA: Diagnosis not present

## 2014-05-03 ENCOUNTER — Ambulatory Visit: Payer: BC Managed Care – PPO | Admitting: Physical Therapy

## 2014-05-03 DIAGNOSIS — M25512 Pain in left shoulder: Secondary | ICD-10-CM | POA: Diagnosis not present

## 2014-05-08 ENCOUNTER — Encounter: Payer: BC Managed Care – PPO | Admitting: Physical Therapy

## 2015-03-27 ENCOUNTER — Ambulatory Visit (INDEPENDENT_AMBULATORY_CARE_PROVIDER_SITE_OTHER): Payer: BC Managed Care – PPO

## 2015-03-27 DIAGNOSIS — Z23 Encounter for immunization: Secondary | ICD-10-CM | POA: Diagnosis not present

## 2015-08-12 ENCOUNTER — Encounter (INDEPENDENT_AMBULATORY_CARE_PROVIDER_SITE_OTHER): Payer: Self-pay

## 2016-03-25 ENCOUNTER — Ambulatory Visit (INDEPENDENT_AMBULATORY_CARE_PROVIDER_SITE_OTHER): Payer: BC Managed Care – PPO

## 2016-03-25 DIAGNOSIS — Z23 Encounter for immunization: Secondary | ICD-10-CM

## 2016-06-17 ENCOUNTER — Encounter: Payer: Self-pay | Admitting: Family Medicine

## 2016-06-17 ENCOUNTER — Ambulatory Visit (INDEPENDENT_AMBULATORY_CARE_PROVIDER_SITE_OTHER): Payer: BC Managed Care – PPO | Admitting: Family Medicine

## 2016-06-17 VITALS — BP 102/70 | HR 85 | Temp 97.1°F | Ht 62.0 in | Wt 234.0 lb

## 2016-06-17 DIAGNOSIS — J111 Influenza due to unidentified influenza virus with other respiratory manifestations: Secondary | ICD-10-CM

## 2016-06-17 MED ORDER — OSELTAMIVIR PHOSPHATE 75 MG PO CAPS: 75.0000 mg | ORAL_CAPSULE | Freq: Two times a day (BID) | ORAL | 0 refills | Status: DC

## 2016-06-17 NOTE — Progress Notes (Signed)
Subjective:  Patient ID: Christin BachSarah B Vanpatten, female    DOB: 10/27/1975  Age: 41 y.o. MRN: [id number]  CC: Fever (pt here today c/o fever, chills, body aches and headaches since Monday night.)   HPI Christin BachSarah B Chopin presents for  Patient presents with congestion. Diffuse headache of moderate intensity. Patient also has chills and a high fever. Body aches worst in the back but present in the legs, shoulders, and torso as well. Has sapped the energy Onset 2 days ago. 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    History [name] has a past medical history of Arthritis; Chronic kidney disease; Diabetes mellitus without complication (HCC); Headache(784.0); and Normal pregnancy, repeat (10/11/2013).   She has a past surgical history that includes Tonsillectomy; Cesarean section; Kidney surgery; Cesarean section (N/A, 10/12/2013); and Tubal ligation (Bilateral, 10/12/2013).   Her family history is not on file.She reports that she has never smoked. She has never used smokeless tobacco. She reports that she does not drink alcohol or use drugs.    ROS Review of Systems  Constitutional: Positive for appetite change, chills and fever.  HENT: Positive for congestion and rhinorrhea. Negative for ear pain, nosebleeds, postnasal drip, sinus pressure and sore throat.   Respiratory: Negative for cough, chest tightness and shortness of breath.   Cardiovascular: Negative for chest pain.  Musculoskeletal: Positive for myalgias.  Skin: Negative for rash.  Neurological: Positive for headaches.    Objective:  BP 102/70   Pulse 85   Temp 97.1 F (36.2 C) (Oral)   Ht 5\' 2"  (1.575 m)   Wt 234 lb (106.1 kg)   LMP 05/17/2016 (Approximate)   Breastfeeding? No   BMI 42.80 kg/m   BP Readings from Last 3 Encounters:  06/17/16 102/70  02/06/14 111/78  11/30/13 105/75    Wt Readings from Last 3 Encounters:  06/17/16 234 lb (106.1 kg)  02/06/14 237 lb (107.5 kg)  11/30/13  239 lb (108.4 kg)     Physical Exam  Constitutional: She appears well-developed and well-nourished.  HENT:  Head: Normocephalic and atraumatic.  Right Ear: Tympanic membrane and external ear normal. No decreased hearing is noted.  Left Ear: Tympanic membrane and external ear normal. No decreased hearing is noted.  Nose: Mucosal edema present. Right sinus exhibits no frontal sinus tenderness. Left sinus exhibits no frontal sinus tenderness.  Mouth/Throat: No oropharyngeal exudate or posterior oropharyngeal erythema.  Neck: No Brudzinski's sign noted.  Pulmonary/Chest: Breath sounds normal. No respiratory distress.  Lymphadenopathy:       Head (right side): No preauricular adenopathy present.       Head (left side): No preauricular adenopathy present.       Right cervical: No superficial cervical adenopathy present.      Left cervical: No superficial cervical adenopathy present.    No results found.  Assessment & Plan:   [name] was seen today for fever.  Diagnoses and all orders for this visit:  Influenza with respiratory manifestation  Other orders -     oseltamivir (TAMIFLU) 75 MG capsule; Take 1 capsule (75 mg total) by mouth 2 (two) times daily.  I have discontinued Ms. Yankowski's buPROPion. I am also having her start on oseltamivir. Additionally, I am having her maintain her FLUoxetine.  Allergies as of 06/17/2016      Reactions   Keflex [cephalexin] Rash      Medication List       Accurate as of 06/17/16 12:34 PM. Always use your  most recent med list.          FLUoxetine 20 MG capsule Commonly known as:  PROZAC Take 20 mg by mouth as needed. Pt takes 1 capsule PO 15 days prior to menses   oseltamivir 75 MG capsule Commonly known as:  TAMIFLU Take 1 capsule (75 mg total) by mouth 2 (two) times daily.        Follow-up: Return if symptoms worsen or fail to improve.  Mechele Claude, M.D.

## 2016-09-14 ENCOUNTER — Ambulatory Visit (INDEPENDENT_AMBULATORY_CARE_PROVIDER_SITE_OTHER): Payer: BC Managed Care – PPO | Admitting: Family Medicine

## 2016-09-14 ENCOUNTER — Encounter: Payer: Self-pay | Admitting: Family Medicine

## 2016-09-14 VITALS — BP 105/83 | HR 61 | Temp 97.4°F | Ht 62.0 in | Wt 238.0 lb

## 2016-09-14 DIAGNOSIS — L235 Allergic contact dermatitis due to other chemical products: Secondary | ICD-10-CM

## 2016-09-14 MED ORDER — PREDNISONE 20 MG PO TABS: ORAL_TABLET | ORAL | 0 refills | Status: DC

## 2016-09-14 NOTE — Progress Notes (Signed)
BP 105/83   Pulse 61   Temp 97.4 F (36.3 C) (Oral)   Ht 5\' 2"  (1.575 m)   Wt 238 lb (108 kg)   BMI 43.53 kg/m    Subjective:    Patient ID: Christy Gay, female    DOB: 05/09/1975, 41 y.o.   MRN: [id number]  HPI: Christy Gay is a 41 y.o. female presenting on 09/14/2016 for Rash (x 1 day   thinks its from sun screen yesterday)   HPI Rash Patient is coming today for a rash that's been going on for the past day. She was outside a lot and she used sunscreen. She used 2 different types of sunscreen on her face on her arms and her upper chest and that is exactly the spots that she has this red ear 3 times a day for any rash. She denies any fevers or chills or drainage from the sites. She denies any rash going anywhere else. She denies any pain associated with it. She has not had a rash quite like this one previously. She does admit that the sunscreens that she was using were new to her.  Relevant past medical, surgical, family and social history reviewed and updated as indicated. Interim medical history since our last visit reviewed. Allergies and medications reviewed and updated.  Review of Systems  Constitutional: Negative for chills and fever.  HENT: Negative for sore throat.   Respiratory: Negative for chest tightness and shortness of breath.   Cardiovascular: Negative for chest pain and leg swelling.  Musculoskeletal: Negative for back pain and gait problem.  Skin: Positive for rash.  Neurological: Negative for light-headedness and headaches.  Psychiatric/Behavioral: Negative for agitation and behavioral problems.  All other systems reviewed and are negative.   Per HPI unless specifically indicated above        Objective:    BP 105/83   Pulse 61   Temp 97.4 F (36.3 C) (Oral)   Ht 5\' 2"  (1.575 m)   Wt 238 lb (108 kg)   BMI 43.53 kg/m   Wt Readings from Last 3 Encounters:  09/14/16 238 lb (108 kg)  06/17/16 234 lb (106.1 kg)  02/06/14 237 lb (107.5 kg)    Physical Exam  Constitutional: She is oriented to person, place, and time. She appears well-developed and well-nourished. No distress.  HENT:  No swelling in the lips or throat are noted.  Eyes: Conjunctivae are normal.  Neck: Neck supple.  Cardiovascular: Normal rate, regular rhythm, normal heart sounds and intact distal pulses.   No murmur heard. Pulmonary/Chest: Effort normal and breath sounds normal. No respiratory distress. She has no wheezes.  Musculoskeletal: Normal range of motion.  Lymphadenopathy:    She has no cervical adenopathy.  Neurological: She is alert and oriented to person, place, and time. Coordination normal.  Skin: Skin is warm and dry. Rash noted. Rash is papular (Fine papular rash on both upper arms and upper chest near her   neck  line, and slightly on cheeks and forehead as well.). She is not diaphoretic.  Psychiatric: She has a normal mood and affect. Her behavior is normal.  Nursing note and vitals reviewed.     Assessment & Plan:   Problem List Items Addressed This Visit    None    Visit Diagnoses    Allergic dermatitis due to other chemical product    -  Primary   Thinks it may have been to one of her sunscreens that she used, will  send prednisone for, also recommended Benadryl and hydrocortisone ointment   Relevant Medications   predniSONE (DELTASONE) 20 MG tablet       Follow up plan: Return if symptoms worsen or fail to improve.  Counseling provided for all of the vaccine components No orders of the defined types were placed in this encounter.   Arville Care, MD [facility] 09/14/2016, 7:00 PM

## 2016-11-13 ENCOUNTER — Encounter: Payer: Self-pay | Admitting: Family Medicine

## 2016-11-13 ENCOUNTER — Ambulatory Visit (INDEPENDENT_AMBULATORY_CARE_PROVIDER_SITE_OTHER): Payer: BC Managed Care – PPO

## 2016-11-13 ENCOUNTER — Ambulatory Visit (INDEPENDENT_AMBULATORY_CARE_PROVIDER_SITE_OTHER): Payer: BC Managed Care – PPO | Admitting: Family Medicine

## 2016-11-13 ENCOUNTER — Ambulatory Visit: Payer: BC Managed Care – PPO | Admitting: Family Medicine

## 2016-11-13 VITALS — BP 120/82 | HR 86 | Temp 97.0°F | Ht 62.0 in | Wt 239.6 lb

## 2016-11-13 DIAGNOSIS — M7989 Other specified soft tissue disorders: Secondary | ICD-10-CM

## 2016-11-13 NOTE — Progress Notes (Signed)
   HPI  Patient presents today here for left foot pain and swelling.  Patient states that she is intermittent foot swelling worse after activity. She does have left forefoot pain with walking especially whenever it is swollen. She's been training for triathlon since school ended. She has been doing a lot of them back. She's also been swimming and riding a bike.  She is on a running about 1 mile a day.  She denies any acute injury. She does have an orthopedic surgeon, Dr. Victorino DikeHewitt, or following right arch. She believes that she has a boot at home that she needs it.  PMH: Smoking status noted ROS: Per HPI  Objective: BP 120/82   Pulse 86   Temp (!) 97 F (36.1 C) (Oral)   Ht 5\' 2"  (1.575 m)   Wt 239 lb 9.6 oz (108.7 kg)   BMI 43.82 kg/m  Gen: NAD, alert, cooperative with exam HEENT: NCAT CV: RRR, good S1/S2, no murmur Resp: CTABL, no wheezes, non-labored Ext: No edema, warm Neuro: Alert and oriented, No gross deficits MSK:  TTP of distal 4th metatarsal, small blu ediscoloration less than 1 cm over that area  Assessment and plan:  # Left foot swelling left foot pain. Some concern for stress fracture, however x-ray is clear, consider CT if no improvement- also consider f/u with Dr. Victorino DikeHewitt Radiology opinion pending. Recommended stopping running ends in the for 2 weeks, where only good supportive shoes, continue swimming and riding bike.     Orders Placed This Encounter  Procedures  . DG Foot Complete Left    Standing Status:   Future    Number of Occurrences:   1    Standing Expiration Date:   01/14/2018    Order Specific Question:   Reason for Exam (SYMPTOM  OR DIAGNOSIS REQUIRED)    Answer:   foot swelling    Order Specific Question:   Is patient pregnant?    Answer:   No    Order Specific Question:   Preferred imaging location?    Answer:   Internal    Order Specific Question:   Radiology Contrast Protocol - do NOT remove file path    Answer:    [email or web address]    No orders of the defined types were placed in this encounter.   Murtis SinkSam Bradshaw, MD [facility] 11/13/2016, 4:06 PM

## 2016-11-16 ENCOUNTER — Ambulatory Visit: Payer: BC Managed Care – PPO | Admitting: Family Medicine

## 2017-03-29 ENCOUNTER — Encounter: Payer: Self-pay | Admitting: Family Medicine

## 2017-03-29 ENCOUNTER — Ambulatory Visit: Payer: BC Managed Care – PPO | Admitting: Family Medicine

## 2017-03-29 VITALS — BP 108/79 | HR 79 | Temp 98.4°F | Ht 62.0 in | Wt 238.0 lb

## 2017-03-29 DIAGNOSIS — J0110 Acute frontal sinusitis, unspecified: Secondary | ICD-10-CM | POA: Diagnosis not present

## 2017-03-29 MED ORDER — AMOXICILLIN-POT CLAVULANATE 875-125 MG PO TABS: 1.0000 | ORAL_TABLET | Freq: Two times a day (BID) | ORAL | 0 refills | Status: DC

## 2017-03-29 MED ORDER — METHYLPREDNISOLONE ACETATE 80 MG/ML IJ SUSP
80.0000 mg | Freq: Once | INTRAMUSCULAR | Status: AC
  Administered 2017-03-29: 80 mg via INTRAMUSCULAR

## 2017-03-29 NOTE — Progress Notes (Signed)
   HPI  Patient presents today here with acute illness.  Patient reports 1 week of hoarseness, headache in the bilateral frontal area, cough, congestion.  She denies fever.  She is tolerating food and fluids like usual.  She is also had some intermittent dizziness.  She has had some shortness of breath.  She also has hoarseness that is concerning. There are no other sick contacts  PMH: Smoking status noted ROS: Per HPI  Objective: BP 108/79 (BP Location: Left Arm)   Pulse 79   Temp 98.4 F (36.9 C) (Oral)   Ht 5\' 2"  (1.575 m)   Wt 238 lb (108 kg)   BMI 43.53 kg/m  Gen: NAD, alert, cooperative with exam HEENT: NCAT, oropharynx moist and clear, nares clear, positive tenderness to palpation of bilateral frontal sinuses CV: RRR, good S1/S2, no murmur Resp: Left upper lung field with persistent sound consistent with mucous plugging-rhonchi Abd: SNTND, BS present, no guarding or organomegaly Ext: No edema, warm Neuro: Alert and oriented, No gross deficits  Assessment and plan:  #Acute sinusitis Treat with Augmentin, patient with one-week illness and tenderness to palpation of the sinuses on exam. Given hoarseness I have proceeded to give her an IM Depo-Medrol injection as well. Supportive care discussed Return to clinic with any concerns   Meds ordered this encounter  Medications  . amoxicillin-clavulanate (AUGMENTIN) 875-125 MG tablet    Sig: Take 1 tablet by mouth 2 (two) times daily.    Dispense:  20 tablet    Refill:  0    Murtis SinkSam Woodson Macha, MD [facility] 03/29/2017, 9:20 AM

## 2017-03-29 NOTE — Patient Instructions (Signed)
Great to see you!   Sinusitis, Adult Sinusitis is soreness and inflammation of your sinuses. Sinuses are hollow spaces in the bones around your face. They are located:  Around your eyes.  In the middle of your forehead.  Behind your nose.  In your cheekbones.  Your sinuses and nasal passages are lined with a stringy fluid (mucus). Mucus normally drains out of your sinuses. When your nasal tissues get inflamed or swollen, the mucus can get trapped or blocked so air cannot flow through your sinuses. This lets bacteria, viruses, and funguses grow, and that leads to infection. Follow these instructions at home: Medicines  Take, use, or apply over-the-counter and prescription medicines only as told by your doctor. These may include nasal sprays.  If you were prescribed an antibiotic medicine, take it as told by your doctor. Do not stop taking the antibiotic even if you start to feel better. Hydrate and Humidify  Drink enough water to keep your pee (urine) clear or pale yellow.  Use a cool mist humidifier to keep the humidity level in your home above 50%.  Breathe in steam for 10-15 minutes, 3-4 times a day or as told by your doctor. You can do this in the bathroom while a hot shower is running.  Try not to spend time in cool or dry air. Rest  Rest as much as possible.  Sleep with your head raised (elevated).  Make sure to get enough sleep each night. General instructions  Put a warm, moist washcloth on your face 3-4 times a day or as told by your doctor. This will help with discomfort.  Wash your hands often with soap and water. If there is no soap and water, use hand sanitizer.  Do not smoke. Avoid being around people who are smoking (secondhand smoke).  Keep all follow-up visits as told by your doctor. This is important. Contact a doctor if:  You have a fever.  Your symptoms get worse.  Your symptoms do not get better within 10 days. Get help right away if:  You  have a very bad headache.  You cannot stop throwing up (vomiting).  You have pain or swelling around your face or eyes.  You have trouble seeing.  You feel confused.  Your neck is stiff.  You have trouble breathing. This information is not intended to replace advice given to you by your health care provider. Make sure you discuss any questions you have with your health care provider. Document Released: 10/07/2007 Document Revised: 12/15/2015 Document Reviewed: 02/13/2015 Elsevier Interactive Patient Education  2018 [employer]  

## 2017-07-05 ENCOUNTER — Encounter: Payer: Self-pay | Admitting: Pediatrics

## 2017-07-05 ENCOUNTER — Ambulatory Visit: Payer: BC Managed Care – PPO | Admitting: Pediatrics

## 2017-07-05 VITALS — BP 115/77 | HR 80 | Temp 97.5°F | Ht 62.0 in | Wt 240.0 lb

## 2017-07-05 DIAGNOSIS — J029 Acute pharyngitis, unspecified: Secondary | ICD-10-CM

## 2017-07-05 DIAGNOSIS — J069 Acute upper respiratory infection, unspecified: Secondary | ICD-10-CM

## 2017-07-05 DIAGNOSIS — J020 Streptococcal pharyngitis: Secondary | ICD-10-CM | POA: Diagnosis not present

## 2017-07-05 DIAGNOSIS — S0921XA Traumatic rupture of right ear drum, initial encounter: Secondary | ICD-10-CM | POA: Diagnosis not present

## 2017-07-05 LAB — RAPID STREP SCREEN (MED CTR MEBANE ONLY): STREP GP A AG, IA W/REFLEX: POSITIVE — AB

## 2017-07-05 MED ORDER — AMOXICILLIN 500 MG PO CAPS: 500.0000 mg | ORAL_CAPSULE | Freq: Two times a day (BID) | ORAL | 0 refills | Status: AC

## 2017-07-05 MED ORDER — OFLOXACIN 0.3 % OT SOLN: 5.0000 [drp] | Freq: Two times a day (BID) | OTIC | 0 refills | Status: AC

## 2017-07-05 NOTE — Patient Instructions (Signed)
Fever reducer and headache: tylenol and ibuprofen, can take together or alternating   Sinus pressure:  Nasal steroid such as flonase/fluticaone or nasocort daily Can also take daily antihistamine such as loratadine/claritin or cetirizine/zyrtec  Sinus rinses/irritation: Netipot or similar with distilled water 2-3 times a day to clear out sinuses or Normal saline nasal spray  Sore throat:  Throat lozenges chloroseptic spray  Stick with bland foods Drink lots of fluids  

## 2017-07-05 NOTE — Progress Notes (Signed)
  Subjective:   Patient ID: Christy Gay, female    DOB: 10/13/1975, 42 y.o.   MRN: [id number] CC: Ear Pain (Right)  HPI: Christy Gay is a 42 y.o. female presenting for Ear Pain (Right)  Cleaning R ear with qtip, was hugged by son and qtip went into ear. Immediate pain, still hurts this morning. Feels like ear is "in a barrel". Decreased hearing compared to usual and to L side. Has had some pain. Noticed some bleeding/crusty discharge this morning from ear.  Past few days has had a sore throat, hurts when she swallows. Minimal cough, dry. Swollen lymph nodes R side past couple of days. Subjective low grade temperatures. Feeling some body aches and chills.  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 Allergies and medications reviewed and updated. Social History   Tobacco Use  Smoking Status Never Smoker  Smokeless Tobacco Never Used   ROS: Per HPI   Objective:    BP 115/77   Pulse 80   Temp (!) 97.5 F (36.4 C) (Oral)   Ht 5\' 2"  (1.575 m)   Wt 240 lb (108.9 kg)   BMI 43.90 kg/m   Wt Readings from Last 3 Encounters:  07/05/17 240 lb (108.9 kg)  03/29/17 238 lb (108 kg)  11/13/16 239 lb 9.6 oz (108.7 kg)     Gen: NAD, alert, cooperative with exam, NCAT EYES: EOMI, no conjunctival injection, or no icterus ENT:  R TM with small (<10%) perforation upper anterior quadrant. Erythema present, fresh blood in ear canal. L Tm dull gray. OP with mild erythema LYMPH: <1cm b/l ant cervical LAD CV: NRRR, normal S1/S2, no murmur, distal pulses 2+ b/l Resp: CTABL, no wheezes, normal WOB Ext: No edema, warm Neuro: Alert and oriented, strength equal b/l UE and LE, coordination grossly normal MSK: normal muscle bulk  Assessment & Plan:  Christy Gay was seen today for ear pain.  Diagnoses and all orders for this visit:  Traumatic rupture of right ear drum, initial  encounter No qtips. Given penetrating trauma, will treat with below x 3 days. rtc 4 weeks for recheck TM and hearing check.       ofloxacin (FLOXIN) 0.3 % OTIC solution; Place 5 drops into the right ear 2 (two) times daily for 3 days.  Acute URI  Sore throat Rapid strep positive -     Rapid Strep Screen (Not at [facility]) -     Culture, Group A Strep  Strep pharyngitis Start below, symptoms care discussed -     amoxicillin (AMOXIL) 500 MG capsule; Take 1 capsule (500 mg total) by mouth 2 (two) times daily for 10 days.   Follow up plan: Return in about 4 weeks (around 08/02/2017). Rex Krasarol Letonya Mangels, MD [facility]

## 2017-09-13 DIAGNOSIS — M25579 Pain in unspecified ankle and joints of unspecified foot: Secondary | ICD-10-CM | POA: Insufficient documentation

## 2017-09-17 ENCOUNTER — Other Ambulatory Visit: Payer: Self-pay | Admitting: Orthopedic Surgery

## 2017-10-04 ENCOUNTER — Encounter (HOSPITAL_BASED_OUTPATIENT_CLINIC_OR_DEPARTMENT_OTHER): Payer: Self-pay | Admitting: *Deleted

## 2017-10-04 ENCOUNTER — Other Ambulatory Visit: Payer: Self-pay

## 2017-10-14 ENCOUNTER — Ambulatory Visit (HOSPITAL_BASED_OUTPATIENT_CLINIC_OR_DEPARTMENT_OTHER): Payer: BC Managed Care – PPO | Admitting: Certified Registered"

## 2017-10-14 ENCOUNTER — Other Ambulatory Visit: Payer: Self-pay

## 2017-10-14 ENCOUNTER — Encounter (HOSPITAL_BASED_OUTPATIENT_CLINIC_OR_DEPARTMENT_OTHER): Payer: Self-pay | Admitting: Certified Registered"

## 2017-10-14 ENCOUNTER — Encounter (HOSPITAL_BASED_OUTPATIENT_CLINIC_OR_DEPARTMENT_OTHER)
Admission: RE | Disposition: A | Discharge status: Home / Self Care | Payer: Self-pay | Source: Ambulatory Visit | Attending: Orthopedic Surgery

## 2017-10-14 ENCOUNTER — Ambulatory Visit (HOSPITAL_BASED_OUTPATIENT_CLINIC_OR_DEPARTMENT_OTHER)
Admission: RE | Admit: 2017-10-14 | Discharge: 2017-10-14 | Disposition: A | Discharge status: Home / Self Care | Payer: BC Managed Care – PPO | Source: Ambulatory Visit | Attending: Orthopedic Surgery | Admitting: Orthopedic Surgery

## 2017-10-14 DIAGNOSIS — M76821 Posterior tibial tendinitis, right leg: Secondary | ICD-10-CM | POA: Insufficient documentation

## 2017-10-14 DIAGNOSIS — M6701 Short Achilles tendon (acquired), right ankle: Secondary | ICD-10-CM | POA: Insufficient documentation

## 2017-10-14 DIAGNOSIS — Z6841 Body Mass Index (BMI) 40.0 and over, adult: Secondary | ICD-10-CM | POA: Insufficient documentation

## 2017-10-14 HISTORY — PX: GASTROCNEMIUS RECESSION: SHX863

## 2017-10-14 HISTORY — PX: LIGAMENT REPAIR: SHX5444

## 2017-10-14 HISTORY — PX: CALCANEAL OSTEOTOMY: SHX1281

## 2017-10-14 HISTORY — PX: TENOLYSIS: SHX396

## 2017-10-14 SURGERY — RECESSION, MUSCLE, GASTROCNEMIUS
Anesthesia: General | Laterality: Right

## 2017-10-14 SURGERY — RECESSION, MUSCLE, GASTROCNEMIUS
Anesthesia: General | Site: Leg Lower | Laterality: Right

## 2017-10-14 MED ORDER — LACTATED RINGERS IV SOLN
INTRAVENOUS | Status: DC
  Administered 2017-10-14 (×2): via INTRAVENOUS

## 2017-10-14 MED ORDER — FENTANYL CITRATE (PF) 100 MCG/2ML IJ SOLN
INTRAMUSCULAR | Status: AC
  Filled 2017-10-14: qty 2

## 2017-10-14 MED ORDER — CEFAZOLIN SODIUM-DEXTROSE 2-4 GM/100ML-% IV SOLN
INTRAVENOUS | Status: AC
  Filled 2017-10-14: qty 100

## 2017-10-14 MED ORDER — MIDAZOLAM HCL 2 MG/2ML IJ SOLN
1.0000 mg | INTRAMUSCULAR | Status: DC | PRN
  Administered 2017-10-14: 2 mg via INTRAVENOUS

## 2017-10-14 MED ORDER — SCOPOLAMINE 1 MG/3DAYS TD PT72: 1.0000 | MEDICATED_PATCH | Freq: Once | TRANSDERMAL | Status: DC | PRN

## 2017-10-14 MED ORDER — MIDAZOLAM HCL 2 MG/2ML IJ SOLN
INTRAMUSCULAR | Status: AC
  Filled 2017-10-14: qty 2

## 2017-10-14 MED ORDER — SODIUM CHLORIDE 0.9 % IV SOLN: INTRAVENOUS | Status: DC

## 2017-10-14 MED ORDER — PROPOFOL 10 MG/ML IV BOLUS
INTRAVENOUS | Status: DC | PRN
  Administered 2017-10-14: 200 mg via INTRAVENOUS

## 2017-10-14 MED ORDER — SCOPOLAMINE 1 MG/3DAYS TD PT72
MEDICATED_PATCH | TRANSDERMAL | Status: AC
  Filled 2017-10-14: qty 1

## 2017-10-14 MED ORDER — FENTANYL CITRATE (PF) 100 MCG/2ML IJ SOLN
INTRAMUSCULAR | Status: DC | PRN
  Administered 2017-10-14: 50 ug via INTRAVENOUS
  Administered 2017-10-14: 25 ug via INTRAVENOUS
  Administered 2017-10-14: 50 ug via INTRAVENOUS

## 2017-10-14 MED ORDER — 0.9 % SODIUM CHLORIDE (POUR BTL) OPTIME
TOPICAL | Status: DC | PRN
  Administered 2017-10-14: 200 mL

## 2017-10-14 MED ORDER — DEXAMETHASONE SODIUM PHOSPHATE 10 MG/ML IJ SOLN
INTRAMUSCULAR | Status: DC | PRN
  Administered 2017-10-14: 10 mg via INTRAVENOUS

## 2017-10-14 MED ORDER — OXYCODONE HCL 5 MG PO TABS: 5.0000 mg | ORAL_TABLET | ORAL | 0 refills | Status: AC | PRN

## 2017-10-14 MED ORDER — ONDANSETRON HCL 4 MG/2ML IJ SOLN
INTRAMUSCULAR | Status: DC | PRN
  Administered 2017-10-14: 4 mg via INTRAVENOUS

## 2017-10-14 MED ORDER — VANCOMYCIN HCL 500 MG IV SOLR
INTRAVENOUS | Status: DC | PRN
  Administered 2017-10-14: 500 mg via TOPICAL

## 2017-10-14 MED ORDER — EPHEDRINE SULFATE 50 MG/ML IJ SOLN
INTRAMUSCULAR | Status: DC | PRN
  Administered 2017-10-14: 10 mg via INTRAVENOUS

## 2017-10-14 MED ORDER — FENTANYL CITRATE (PF) 100 MCG/2ML IJ SOLN
50.0000 ug | INTRAMUSCULAR | Status: DC | PRN
  Administered 2017-10-14: 100 ug via INTRAVENOUS

## 2017-10-14 MED ORDER — BUPIVACAINE-EPINEPHRINE (PF) 0.5% -1:200000 IJ SOLN
INTRAMUSCULAR | Status: DC | PRN
  Administered 2017-10-14: 30 mL via PERINEURAL

## 2017-10-14 MED ORDER — FENTANYL CITRATE (PF) 100 MCG/2ML IJ SOLN
25.0000 ug | INTRAMUSCULAR | Status: DC | PRN
  Administered 2017-10-14: 50 ug via INTRAVENOUS

## 2017-10-14 MED ORDER — BUPIVACAINE-EPINEPHRINE 0.5% -1:200000 IJ SOLN
INTRAMUSCULAR | Status: DC | PRN
  Administered 2017-10-14: 7 mL

## 2017-10-14 MED ORDER — PROPOFOL 500 MG/50ML IV EMUL
INTRAVENOUS | Status: AC
  Filled 2017-10-14: qty 50

## 2017-10-14 MED ORDER — CHLORHEXIDINE GLUCONATE 4 % EX LIQD: 60.0000 mL | Freq: Once | CUTANEOUS | Status: DC

## 2017-10-14 MED ORDER — SCOPOLAMINE 1 MG/3DAYS TD PT72
1.0000 | MEDICATED_PATCH | TRANSDERMAL | Status: DC
  Administered 2017-10-14: 1.5 mg via TRANSDERMAL

## 2017-10-14 MED ORDER — PROMETHAZINE HCL 25 MG/ML IJ SOLN: 6.2500 mg | INTRAMUSCULAR | Status: DC | PRN

## 2017-10-14 MED ORDER — CEFAZOLIN SODIUM-DEXTROSE 2-4 GM/100ML-% IV SOLN
2.0000 g | INTRAVENOUS | Status: AC
  Administered 2017-10-14: 2 g via INTRAVENOUS

## 2017-10-14 SURGICAL SUPPLY — 75 items
ANCH SUT 4.5 2 LD SLD TPE (Anchor) ×3 IMPLANT
ANCHOR SUT DBL LOAD 4.5 TCP (Anchor) ×1 IMPLANT
BANDAGE ACE 4X5 VEL STRL LF (GAUZE/BANDAGES/DRESSINGS) IMPLANT
BANDAGE ESMARK 6X9 LF (GAUZE/BANDAGES/DRESSINGS) IMPLANT
BIT DRILL 2.9 CANN QC NONSTRL (BIT) ×1 IMPLANT
BLADE SURG 15 STRL LF DISP TIS (BLADE) ×3 IMPLANT
BLADE SURG 15 STRL SS (BLADE) ×8
BNDG CMPR 9X6 STRL LF SNTH (GAUZE/BANDAGES/DRESSINGS)
BNDG COHESIVE 4X5 TAN STRL (GAUZE/BANDAGES/DRESSINGS) ×1 IMPLANT
BNDG COHESIVE 6X5 TAN STRL LF (GAUZE/BANDAGES/DRESSINGS) ×1 IMPLANT
BNDG ESMARK 6X9 LF (GAUZE/BANDAGES/DRESSINGS)
BOOT STEPPER DURA LG (SOFTGOODS) IMPLANT
BOOT STEPPER DURA MED (SOFTGOODS) IMPLANT
BOOT STEPPER DURA XLG (SOFTGOODS) IMPLANT
CHLORAPREP W/TINT 26ML (MISCELLANEOUS) ×4 IMPLANT
COVER BACK TABLE 60X90IN (DRAPES) ×4 IMPLANT
CUFF TOURNIQUET SINGLE 34IN LL (TOURNIQUET CUFF) ×1 IMPLANT
DRAPE EXTREMITY T 121X128X90 (DRAPE) ×4 IMPLANT
DRAPE U-SHAPE 47X51 STRL (DRAPES) ×4 IMPLANT
DRSG MEPITEL 4X7.2 (GAUZE/BANDAGES/DRESSINGS) ×4 IMPLANT
DRSG PAD ABDOMINAL 8X10 ST (GAUZE/BANDAGES/DRESSINGS) ×4 IMPLANT
ELECT REM PT RETURN 9FT ADLT (ELECTROSURGICAL) ×4
ELECTRODE REM PT RTRN 9FT ADLT (ELECTROSURGICAL) ×3 IMPLANT
GAUZE SPONGE 4X4 12PLY STRL (GAUZE/BANDAGES/DRESSINGS) ×4 IMPLANT
GLOVE BIO SURGEON STRL SZ8 (GLOVE) ×4 IMPLANT
GLOVE BIOGEL PI IND STRL 7.0 (GLOVE) IMPLANT
GLOVE BIOGEL PI IND STRL 8 (GLOVE) ×6 IMPLANT
GLOVE BIOGEL PI INDICATOR 7.0 (GLOVE) ×3
GLOVE BIOGEL PI INDICATOR 8 (GLOVE) ×2
GLOVE ECLIPSE 6.5 STRL STRAW (GLOVE) ×2 IMPLANT
GLOVE ECLIPSE 8.0 STRL XLNG CF (GLOVE) ×4 IMPLANT
GOWN STRL REUS W/ TWL LRG LVL3 (GOWN DISPOSABLE) ×3 IMPLANT
GOWN STRL REUS W/ TWL XL LVL3 (GOWN DISPOSABLE) ×6 IMPLANT
GOWN STRL REUS W/TWL LRG LVL3 (GOWN DISPOSABLE) ×4
GOWN STRL REUS W/TWL XL LVL3 (GOWN DISPOSABLE) ×8
K-WIRE ACE 1.6X6 (WIRE) ×8
KIT ACCESSORY DRILL 5 (KITS) ×1 IMPLANT
KWIRE ACE 1.6X6 (WIRE) IMPLANT
NDL HYPO 25X1 1.5 SAFETY (NEEDLE) IMPLANT
NDL SUT 6 .5 CRC .975X.05 MAYO (NEEDLE) IMPLANT
NEEDLE HYPO 22GX1.5 SAFETY (NEEDLE) IMPLANT
NEEDLE HYPO 25X1 1.5 SAFETY (NEEDLE) ×4 IMPLANT
NEEDLE MAYO TAPER (NEEDLE) ×4
NS IRRIG 1000ML POUR BTL (IV SOLUTION) ×4 IMPLANT
PACK BASIN DAY SURGERY FS (CUSTOM PROCEDURE TRAY) ×4 IMPLANT
PAD CAST 4YDX4 CTTN HI CHSV (CAST SUPPLIES) ×3 IMPLANT
PADDING CAST COTTON 4X4 STRL (CAST SUPPLIES) ×4
PADDING CAST COTTON 6X4 STRL (CAST SUPPLIES) ×1 IMPLANT
PENCIL BUTTON HOLSTER BLD 10FT (ELECTRODE) ×4 IMPLANT
SANITIZER HAND PURELL 535ML FO (MISCELLANEOUS) ×4 IMPLANT
SCREW LAG  RD HEAD 4.0 46 LTH (Screw) ×1 IMPLANT
SCREW LAG  RD HEAD 4.0 50 LTH (Screw) ×1 IMPLANT
SCREW LAG RD HEAD 4.0 46 LTH (Screw) IMPLANT
SCREW LAG RD HEAD 4.0 50 LTH (Screw) IMPLANT
SCREW PEEK TENODESIS 6X12MM (Screw) ×1 IMPLANT
SHEET MEDIUM DRAPE 40X70 STRL (DRAPES) ×4 IMPLANT
SPLINT FAST PLASTER 5X30 (CAST SUPPLIES) ×20
SPLINT PLASTER CAST FAST 5X30 (CAST SUPPLIES) IMPLANT
SPONGE LAP 18X18 RF (DISPOSABLE) ×4 IMPLANT
STOCKINETTE 6  STRL (DRAPES) ×1
STOCKINETTE 6 STRL (DRAPES) ×3 IMPLANT
SUCTION FRAZIER HANDLE 10FR (MISCELLANEOUS) ×1
SUCTION TUBE FRAZIER 10FR DISP (MISCELLANEOUS) IMPLANT
SUT ETHILON 3 0 PS 1 (SUTURE) ×5 IMPLANT
SUT MNCRL AB 3-0 PS2 18 (SUTURE) ×4 IMPLANT
SUT VIC AB 0 SH 27 (SUTURE) ×1 IMPLANT
SUT VIC AB 2-0 SH 27 (SUTURE) ×4
SUT VIC AB 2-0 SH 27XBRD (SUTURE) IMPLANT
SUT VICRYL 0 UR6 27IN ABS (SUTURE) ×2 IMPLANT
SYR BULB 3OZ (MISCELLANEOUS) ×4 IMPLANT
SYR CONTROL 10ML LL (SYRINGE) ×1 IMPLANT
TOWEL GREEN STERILE FF (TOWEL DISPOSABLE) ×4 IMPLANT
TUBE CONNECTING 20X1/4 (TUBING) ×1 IMPLANT
UNDERPAD 30X30 (UNDERPADS AND DIAPERS) ×4 IMPLANT
YANKAUER SUCT BULB TIP NO VENT (SUCTIONS) IMPLANT

## 2017-10-14 NOTE — Anesthesia Preprocedure Evaluation (Addendum)
Anesthesia Evaluation  Patient identified by MRN, date of birth, ID band Patient awake    Reviewed: Allergy & Precautions, NPO status , Patient's Chart, lab work & pertinent test results  History of Anesthesia Complications Negative for: history of anesthetic complications  Airway Mallampati: II  TM Distance: >3 FB Neck ROM: Full    Dental no notable dental hx. (+) Dental Advisory Given   Pulmonary neg pulmonary ROS,    Pulmonary exam normal        Cardiovascular negative cardio ROS Normal cardiovascular exam     Neuro/Psych  Headaches,    GI/Hepatic negative GI ROS, Neg liver ROS,   Endo/Other  negative endocrine ROSdiabetesMorbid obesity  Renal/GU negative Renal ROS     Musculoskeletal negative musculoskeletal ROS (+)   Abdominal   Peds  Hematology negative hematology ROS (+)   Anesthesia Other Findings Day of surgery medications reviewed with the patient.  Reproductive/Obstetrics                            Anesthesia Physical Anesthesia Plan  ASA: II  Anesthesia Plan: General   Post-op Pain Management:  Regional for Post-op pain   Induction: Intravenous  PONV Risk Score and Plan: 3 and Ondansetron, Dexamethasone and Scopolamine patch - Pre-op  Airway Management Planned: LMA  Additional Equipment:   Intra-op Plan:   Post-operative Plan: Extubation in OR  Informed Consent: I have reviewed the patients History and Physical, chart, labs and discussed the procedure including the risks, benefits and alternatives for the proposed anesthesia with the patient or authorized representative who has indicated his/her understanding and acceptance.   Dental advisory given  Plan Discussed with: CRNA, Anesthesiologist and Surgeon  Anesthesia Plan Comments:        Anesthesia Quick Evaluation

## 2017-10-14 NOTE — Discharge Instructions (Addendum)
John Hewitt, MD °[facility] ° °Please read the following information regarding your care after surgery. ° °Medications  °You only need a prescription for the narcotic pain medicine (ex. oxycodone, Percocet, Norco).  All of the other medicines listed below are available over the counter. °X Aleve 2 pills twice a day for the first 3 days after surgery. °X acetominophen (Tylenol) 650 mg every 4-6 hours as you need for minor to moderate pain °X oxycodone as prescribed for severe pain ° °Narcotic pain medicine (ex. oxycodone, Percocet, Vicodin) will cause constipation.  To prevent this problem, take the following medicines while you are taking any pain medicine. °X docusate sodium (Colace) 100 mg twice a day X senna (Senokot) 2 tablets twice a day ° °X To help prevent blood clots, take a baby aspirin (81 mg) twice a day for two weeks after surgery.  You should also get up every hour while you are awake to move around.   ° °Weight Bearing °? Bear weight when you are able on your operated leg or foot. °? Bear weight only on your operated foot in the post-op shoe. °X Do not bear any weight on the operated leg or foot. ° °Cast / Splint / Dressing °X Keep your splint, cast or dressing clean and dry.  Don’t put anything (coat hanger, pencil, etc) down inside of it.  If it gets damp, use a hair dryer on the cool setting to dry it.  If it gets soaked, call the office to schedule an appointment for a cast change. °? Remove your dressing 3 days after surgery and cover the incisions with dry dressings.   ° °After your dressing, cast or splint is removed; you may shower, but do not soak or scrub the wound.  Allow the water to run over it, and then gently pat it dry. ° °Swelling °It is normal for you to have swelling where you had surgery.  To reduce swelling and pain, keep your toes above your nose for at least 3 days after surgery.  It may be necessary to keep your foot or leg elevated for several weeks.  If it hurts,  it should be elevated. ° °Follow Up °Call my office at [phone] when you are discharged from the hospital or surgery center to schedule an appointment to be seen two weeks after surgery. ° °Call my office at [phone] if you develop a fever >101.5° F, nausea, vomiting, bleeding from the surgical site or severe pain.   ° ° °Regional Anesthesia Blocks ° °1. Numbness or the inability to move the "blocked" extremity may last from 3-48 hours after placement. The length of time depends on the medication injected and your individual response to the medication. If the numbness is not going away after 48 hours, call your surgeon. ° °2. The extremity that is blocked will need to be protected until the numbness is gone and the  Strength has returned. Because you cannot feel it, you will need to take extra care to avoid injury. Because it may be weak, you may have difficulty moving it or using it. You may not know what position it is in without looking at it while the block is in effect. ° °3. For blocks in the legs and feet, returning to weight bearing and walking needs to be done carefully. You will need to wait until the numbness is entirely gone and the strength has returned. You should be able to move your leg and foot normally before you try   and bear weight or walk. You will need someone to be with you when you first try to ensure you do not fall and possibly risk injury. ° °4. Bruising and tenderness at the needle site are common side effects and will resolve in a few days. ° °5. Persistent numbness or new problems with movement should be communicated to the surgeon or the [facility] ([phone])/ [facility] ([phone]). ° ° ° ° ° °Post Anesthesia Home Care Instructions ° °Activity: °Get plenty of rest for the remainder of the day. A responsible individual must stay with you for 24 hours following the procedure.  °For the next 24 hours, DO NOT: °-Drive a car °-Operate  machinery °-Drink alcoholic beverages °-Take any medication unless instructed by your physician °-Make any legal decisions or sign important papers. ° °Meals: °Start with liquid foods such as gelatin or soup. Progress to regular foods as tolerated. Avoid greasy, spicy, heavy foods. If nausea and/or vomiting occur, drink only clear liquids until the nausea and/or vomiting subsides. Call your physician if vomiting continues. ° °Special Instructions/Symptoms: °Your throat may feel dry or sore from the anesthesia or the breathing tube placed in your throat during surgery. If this causes discomfort, gargle with warm salt water. The discomfort should disappear within 24 hours. ° °If you had a scopolamine patch placed behind your ear for the management of post- operative nausea and/or vomiting: ° °1. The medication in the patch is effective for 72 hours, after which it should be removed.  Wrap patch in a tissue and discard in the trash. Wash hands thoroughly with soap and water. °2. You may remove the patch earlier than 72 hours if you experience unpleasant side effects which may include dry mouth, dizziness or visual disturbances. °3. Avoid touching the patch. Wash your hands with soap and water after contact with the patch. °  ° °

## 2017-10-14 NOTE — Anesthesia Postprocedure Evaluation (Signed)
Anesthesia Post Note  Patient: Christy BachSarah B Gay  Procedure(s) Performed: Right Gastroc Recession (Right Leg Lower) Right Posterior Tibial Tenolysis (Right Foot) Flexor Digitorum Longus Transfer to Navicular (Right Foot) Right Calcaneal Osteotomy (Left Foot) Right Spring Ligament Repair (Right Foot)     Patient location during evaluation: PACU Anesthesia Type: General Level of consciousness: sedated Pain management: pain level controlled Vital Signs Assessment: post-procedure vital signs reviewed and stable Respiratory status: spontaneous breathing and respiratory function stable Cardiovascular status: stable Postop Assessment: no apparent nausea or vomiting Anesthetic complications: no    Last Vitals:  Vitals:   10/14/17 1600 10/14/17 1615  BP: 94/67 100/69  Pulse: 78 67  Resp: (!) 26 11  Temp:    SpO2: 97% 92%    Last Pain:  Vitals:   10/14/17 1600  TempSrc:   PainSc: 0-No pain                 Cena Bruhn DANIEL

## 2017-10-14 NOTE — Progress Notes (Signed)
Assisted Dr. Singer with right, ultrasound guided, popliteal block. Side rails up, monitors on throughout procedure. See vital signs in flow sheet. Tolerated Procedure well.  

## 2017-10-14 NOTE — Op Note (Signed)
10/14/2017  3:06 PM  PATIENT:  Christy Gay  42 y.o. female  PRE-OPERATIVE DIAGNOSIS: 1.  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  Tight right heel cord  POST-OPERATIVE DIAGNOSIS:  Same      3.  Right spring ligament rupture  Procedure(s): 1.  Right gastrocnemius recession through a separate incision 2.  Right medializing calcaneal osteotomy through separate incision 3.  Right posterior tibial tendon tenolysis 4.  Deep transfer of the right flexor digitorum longus to the navicular 5.  Reconstruction of the right spring ligament 6.  AP lateral and Harris heel radiographs of the right foot  SURGEON:  Toni Arthurs, MD  ASSISTANT: None  ANESTHESIA:   General, regional  EBL:  minimal   TOURNIQUET: Approximately 1 hour and 30 minutes at 300 mmHg  COMPLICATIONS:  None apparent  DISPOSITION:  Extubated, awake and stable to recovery.  INDICATION FOR PROCEDURE: The patient is a 42 year old female with a past medical history significant for diabetes.  She has a long history of right ankle and hindfoot pain due to posterior tibial tendon dysfunction.  She has failed nonoperative treatment to date including activity modification, oral anti-inflammatories, bracing and physical therapy.  She presents now for operative treatment of this painful and limiting condition.  The risks and benefits of the alternative treatment options have been discussed in detail.  The patient wishes to proceed with surgery and specifically understands risks of bleeding, infection, nerve damage, blood clots, need for additional surgery, amputation and death.  PROCEDURE IN DETAIL:  After pre operative consent was obtained, and the correct operative site was identified, the patient was brought to the operating room and placed supine on the OR table.  Anesthesia was administered.  Pre-operative antibiotics were administered.  A surgical timeout was taken.  The right lower extremity was prepped and  draped in standard sterile fashion with a tourniquet around the thigh.  The extremity was elevated and the tourniquet was inflated to 300 mmHg.  A longitudinal incision was made at the medial calf.  Dissection was carried down to the sub-cutaneous tissues.  The medial fascia was incised.  The gastrocnemius tendon was identified.  The sural nerve was protected.  The tendon was divided from medial to lateral under direct vision.  The wound was irrigated and closed with Monocryl and nylon.  Attention was then turned to the lateral aspect of the heel.  An oblique incision was made and dissection was carried down through the subcutaneous tissues to the lateral wall of the calcaneus.  The periosteum was incised and elevated.  An osteotomy was then made with the oscillating saw protecting the surrounding soft tissues.  The tuberosity was translated medially several millimeters and the osteotomy was fixed with 2 Biomet 4 mm partially-threaded cannulated screws.  The lateral wall bone was smoothed.  The incision was irrigated and closed with nylon.  All incisions were sprinkled with vancomycin powder before closing.  Attention was turned to the medial hindfoot.  An oblique incision was made over the posterior tibial tendon.  Dissection was carried down sharply to the tendon sheath.  It was incised.  There was significant tenosynovitis and fluid.  A scalpel was used to perform a careful tenolysis along the tendon.  The spring ligament was noted to be ruptured.  Imbricating sutures of 0 Vicryl were placed in the tear after debriding the edges.  These sutures were tagged for later tying.  The flexor digitorum longus tendon  was identified.  It was dissected free and transected at the knot of Sherilyn CooterHenry.  A whipstitch was placed in the end of the tendon.  4.5 mm [facility] medical anchor with suture tape was inserted into the sustentaculum tali after drilling and tapping in order to reconstruct the spring ligament.  A guidepin was  then placed in the medial navicular from dorsal to plantar.  The appropriate location was confirmed with x-rays.  The guidepin was overdrilled.  Both limbs of suture tape were passed through this hole from dorsal to plantar and plantar to dorsal.  The flexor digitorum longus tendon was then passed from plantar to dorsal.  Both limbs of suture and the tendon were tensioned appropriately.  A [place] medical bolt was then placed in the previously drilled hole as an interference screw.  The limbs of suture were then tied to each other.  The tendon suture was tied to the periosteum with free needles.  The previously placed sutures in the spring ligament were tied.  Final AP, lateral and Harris heel radiographs showed appropriate position and length of all hardware in appropriate alignment of the osteotomy.  The medial wound was then irrigated copiously and sprinkled with vancomycin powder.  The tendon sheath was repaired with Vicryl.  Subcutaneous tissues were approximated with Monocryl.  Skin incision was closed with nylon.  Sterile dressings were applied followed by a well-padded short leg splint.  The tourniquet was released after application of the dressings.  The patient was awakened from anesthesia and transported to the recovery room in stable condition.   FOLLOW UP PLAN: The patient will be nonweightbearing on the right lower extremity.  Aspirin for DVT prophylaxis.  Follow-up in the office in 2 weeks for suture removal and conversion to a short leg cast.   RADIOGRAPHS: AP, lateral and Harris heel radiographs of the right foot are obtained intraoperatively.  These show interval osteotomy of the calcaneus.  Hardware is appropriately positioned and of the appropriate lengths.  No other acute injuries.

## 2017-10-14 NOTE — Anesthesia Procedure Notes (Addendum)
Procedure Name: LMA Insertion Date/Time: 10/14/2017 1:18 PM Performed by: Sheryn BisonBlocker, Corianne Buccellato D, CRNA Pre-anesthesia Checklist: Patient identified, Emergency Drugs available, Suction available and Patient being monitored Patient Re-evaluated:Patient Re-evaluated prior to induction Oxygen Delivery Method: Circle system utilized Preoxygenation: Pre-oxygenation with 100% oxygen Induction Type: IV induction Ventilation: Mask ventilation without difficulty LMA: LMA inserted LMA Size: 4.0 Number of attempts: 1 Airway Equipment and Method: Bite block Placement Confirmation: positive ETCO2 Tube secured with: Tape Dental Injury: Teeth and Oropharynx as per pre-operative assessment

## 2017-10-14 NOTE — Anesthesia Procedure Notes (Signed)
Anesthesia Regional Block: Popliteal block   Pre-Anesthetic Checklist: ,, timeout performed, Correct Patient, Correct Site, Correct Laterality, Correct Procedure, Correct Position, site marked, Risks and benefits discussed,  Surgical consent,  Pre-op evaluation,  At surgeon's request and post-op pain management  Laterality: Right  Prep: chloraprep       Needles:  Injection technique: Single-shot  Needle Type: Echogenic Stimulator Needle          Additional Needles:   Narrative:  Start time: 10/14/2017 12:36 PM End time: 10/14/2017 12:46 PM Injection made incrementally with aspirations every 5 mL.  Performed by: Personally  Anesthesiologist: Heather RobertsSinger, Maylea Soria, MD  Additional Notes: A functioning IV was confirmed and monitors were applied.  Sterile prep and drape, hand hygiene and sterile gloves were used.  Negative aspiration and test dose prior to incremental administration of local anesthetic. The patient tolerated the procedure well.Ultrasound  guidance: relevant anatomy identified, needle position confirmed, local anesthetic spread visualized around nerve(s), vascular puncture avoided.  Image printed for medical record.

## 2017-10-14 NOTE — Transfer of Care (Signed)
Immediate Anesthesia Transfer of Care Note  Patient: Christy BachSarah B Gay  Procedure(s) Performed: Right Gastroc Recession (Right Leg Lower) Right Posterior Tibial Tenolysis (Right Foot) Flexor Digitorum Longus Transfer to Navicular (Right Foot) Right Calcaneal Osteotomy Spring Ligament Repair (Left Foot)  Patient Location: PACU  Anesthesia Type:GA combined with regional for post-op pain  Level of Consciousness: awake and patient cooperative  Airway & Oxygen Therapy: Patient Spontanous Breathing and Patient connected to face mask oxygen  Post-op Assessment: Report given to RN and Post -op Vital signs reviewed and stable  Post vital signs: Reviewed and stable  Last Vitals:  Vitals Value Taken Time  BP    Temp    Pulse 72 10/14/2017  3:04 PM  Resp 16 10/14/2017  3:04 PM  SpO2 100 % 10/14/2017  3:04 PM  Vitals shown include unvalidated device data.  Last Pain:  Vitals:   10/14/17 1203  TempSrc: Oral  PainSc: 6       Patients Stated Pain Goal: 2 (10/14/17 1203)  Complications: No apparent anesthesia complications
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
## 2017-10-15 ENCOUNTER — Encounter (HOSPITAL_BASED_OUTPATIENT_CLINIC_OR_DEPARTMENT_OTHER): Payer: Self-pay | Admitting: Orthopedic Surgery

## 2017-12-18 ENCOUNTER — Encounter: Payer: Self-pay | Admitting: Family Medicine

## 2017-12-18 ENCOUNTER — Ambulatory Visit (INDEPENDENT_AMBULATORY_CARE_PROVIDER_SITE_OTHER): Payer: BC Managed Care – PPO | Admitting: Family Medicine

## 2017-12-18 VITALS — BP 102/69 | HR 68 | Temp 97.6°F | Ht 62.0 in | Wt 251.5 lb

## 2017-12-18 DIAGNOSIS — H6502 Acute serous otitis media, left ear: Secondary | ICD-10-CM

## 2017-12-18 MED ORDER — IBUPROFEN 800 MG PO TABS: 800.0000 mg | ORAL_TABLET | Freq: Three times a day (TID) | ORAL | 0 refills | Status: DC | PRN

## 2017-12-18 MED ORDER — AMOXICILLIN-POT CLAVULANATE 875-125 MG PO TABS: 1.0000 | ORAL_TABLET | Freq: Two times a day (BID) | ORAL | 0 refills | Status: DC

## 2017-12-18 NOTE — Patient Instructions (Signed)
You have a middle ear infection.  I placed you on oral antibiotics.  I have also refilled your ibuprofen.  Remember to take the ibuprofen with food.  Avoid other NSAID medications like Advil, Aleve.  Make sure you are drinking plenty of water.  Follow-up if symptoms are not improving.   Otitis Media, Adult Otitis media is redness, soreness, and puffiness (swelling) in the space just behind your eardrum (middle ear). It may be caused by allergies or infection. It often happens along with a cold. Follow these instructions at home:  Take your medicine as told. Finish it even if you start to feel better.  Only take over-the-counter or prescription medicines for pain, discomfort, or fever as told by your doctor.  Follow up with your doctor as told. Contact a doctor if:  You have otitis media only in one ear, or bleeding from your nose, or both.  You notice a lump on your neck.  You are not getting better in 3-5 days.  You feel worse instead of better. Get help right away if:  You have pain that is not helped with medicine.  You have puffiness, redness, or pain around your ear.  You get a stiff neck.  You cannot move part of your face (paralysis).  You notice that the bone behind your ear hurts when you touch it. This information is not intended to replace advice given to you by your health care provider. Make sure you discuss any questions you have with your health care provider. Document Released: 10/07/2007 Document Revised: 09/26/2015 Document Reviewed: 11/15/2012 Elsevier Interactive Patient Education  2017 [employer]

## 2017-12-18 NOTE — Progress Notes (Signed)
Subjective: CC: Ear PCP: Mechele ClaudeStacks, Warren, MD ZOX:WRUEAHPI:Christy Gay is a 42 y.o. female presenting to clinic today for:  1.  Ear pain Patient reports that she had onset of left-sided ear pain Wednesday.  She is done some swelling but also has allergies.  She has been using ofloxacin otic drops since Thursday morning with no improvement in symptoms.  She also tried using peroxide and notes that this seemed to make things worse.  She denies any fevers, drainage, dizziness, sinus drainage.  She notes that she has had a bubbling sensation in her left ear.  She is also used ibuprofen 800 mg last night and this morning with some improvement in the discomfort.  Medical history significant for allergy to Keflex as a child.  She has had no problems with penicillins.   ROS: Per HPI  Allergies  Allergen Reactions  . Keflex [Cephalexin] Rash   Past Medical History:  Diagnosis Date  . Arthritis    neck, upper back  . 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 Diabetes mellitus without complication (HCC)    gestational with first pregnancy  . Headache(784.0)    migraines  . Normal pregnancy, repeat 10/11/2013   No current outpatient medications on file. Social History   Socioeconomic History  . Marital status: Married    Spouse name: Not on file  . Number of children: Not on file  . Years of education: Not on file  . Highest education level: Not on file  Occupational History  . Not on file  Social Needs  . Financial resource strain: Not on file  . Food insecurity:    Worry: Not on file    Inability: Not on file  . Transportation needs:    Medical: Not on file    Non-medical: Not on file  Tobacco Use  . Smoking status: Never Smoker  . Smokeless tobacco: Never Used  Substance and Sexual Activity  . Alcohol use: No  . Drug use: No  . Sexual activity: Not on file  Lifestyle  . Physical activity:    Days per week: Not on file    Minutes per session: Not on file    . Stress: Not on file  Relationships  . Social connections:    Talks on phone: Not on file    Gets together: Not on file    Attends religious service: Not on file    Active member of club or organization: Not on file    Attends meetings of clubs or organizations: Not on file    Relationship status: Not on file  . Intimate partner violence:    Fear of current or ex partner: Not on file    Emotionally abused: Not on file    Physically abused: Not on file    Forced sexual activity: Not on file  Other Topics Concern  . Not on file  Social History Narrative  . Not on file   No family history on file.  Objective: Office vital signs reviewed. BP 102/69   Pulse 68   Temp 97.6 F (36.4 C) (Oral)   Ht 5\' 2"  (1.575 m)   Wt 251 lb 8 oz (114.1 kg)   BMI 46.00 kg/m   Physical Examination:  General: Awake, alert, well nourished, No acute distress HEENT: Normal    Neck: No masses palpated. No lymphadenopathy    Ears: Tympanic membranes intact, R w/ normal light reflex, no erythema, no bulging; L TM  with serous/slightly opaque fluid behind the tympanic membrane.  There is mild bulging.  There is erythema.    Eyes: PERRLA, extraocular membranes intact, sclera white    Nose: nasal turbinates moist, no nasal discharge    Throat: moist mucus membranes, no erythema, no tonsillar exudate.  Airway is patent Cardio: regular rate  Pulm: normal work of breathing on room air  Assessment/ Plan: 42 y.o. female   1. Non-recurrent acute serous otitis media of left ear She is afebrile nontoxic-appearing.  Physical exam with evidence of possible early bacterial infection.  I placed her on Augmentin p.o. twice daily for the next 10 days.  Ibuprofen refilled.  Home care instructions reviewed with patient.  Handout provided.  Reasons for return discussed.  Follow-up PRN.    Meds ordered this encounter  Medications  . amoxicillin-clavulanate (AUGMENTIN) 875-125 MG tablet    Sig: Take 1 tablet by  mouth 2 (two) times daily.    Dispense:  20 tablet    Refill:  0  . ibuprofen (ADVIL,MOTRIN) 800 MG tablet    Sig: Take 1 tablet (800 mg total) by mouth every 8 (eight) hours as needed.    Dispense:  30 tablet    Refill:  0     Levan Aloia Hulen SkainsM Memorie Yokoyama, DO [facility] [phone]

## 2018-01-06 ENCOUNTER — Ambulatory Visit: Payer: BC Managed Care – PPO | Admitting: Physical Therapy

## 2018-01-07 ENCOUNTER — Other Ambulatory Visit: Payer: Self-pay

## 2018-01-07 ENCOUNTER — Ambulatory Visit: Payer: BC Managed Care – PPO | Attending: Student | Admitting: Physical Therapy

## 2018-01-07 DIAGNOSIS — R600 Localized edema: Secondary | ICD-10-CM | POA: Diagnosis present

## 2018-01-07 DIAGNOSIS — M25571 Pain in right ankle and joints of right foot: Secondary | ICD-10-CM | POA: Diagnosis present

## 2018-01-07 NOTE — Therapy (Signed)
[facility] [address] [place], [place], [address] Phone: [phone]   Fax:  [phone]  Physical Therapy Evaluation  Patient Details  Name: Christy Gay MRN: [id number] Date of Birth: [birth year] Referring Provider: Alfredo Martinez PA-C   Encounter Date: 01/07/2018  PT End of Session - 01/07/18 1226    Visit Number  1    Number of Visits  12    Date for PT Re-Evaluation  02/04/18    Authorization Type  FOTO AT LEAST EVERY 5TH VISIT, 10TH VISIT PROGRESS NOTE.    PT Start Time  1030    PT Stop Time  1115    PT Time Calculation (min)  45 min    Activity Tolerance  Patient tolerated treatment well    Behavior During Therapy  WFL for tasks assessed/performed       Past Medical History:  Diagnosis Date  . Arthritis    neck, upper back  . 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 Diabetes mellitus without complication (HCC)    gestational with first pregnancy  . Headache(784.0)    migraines  . Normal pregnancy, repeat 10/11/2013    Past Surgical History:  Procedure Laterality Date  . CALCANEAL OSTEOTOMY Left 10/14/2017   Procedure: Right Calcaneal Osteotomy;  Surgeon: Toni Arthurs, MD;  Location: [facility];  Service: Orthopedics;  Laterality: Left;  . CERVICAL ABLATION    . CESAREAN SECTION    . CESAREAN SECTION N/A 10/12/2013   Procedure: CESAREAN SECTION;  Surgeon: Sherian Rein, MD;  Location: [facility];  Service: Obstetrics;  Laterality: N/A;  . GASTROCNEMIUS RECESSION Right 10/14/2017   Procedure: Right Gastroc Recession;  Surgeon: Toni Arthurs, MD;  Location: [facility];  Service: Orthopedics;  Laterality: Right;  . KIDNEY SURGERY    . LIGAMENT REPAIR Right 10/14/2017   Procedure: Right Spring Ligament Repair;  Surgeon: Toni Arthurs, MD;  Location: [facility];  Service: Orthopedics;  Laterality: Right;  . TENOLYSIS Right 10/14/2017   Procedure: Right  Posterior Tibial Tenolysis;  Surgeon: Toni Arthurs, MD;  Location: [facility];  Service: Orthopedics;  Laterality: Right;  . TONSILLECTOMY    . TUBAL LIGATION Bilateral 10/12/2013   Procedure: BILATERAL TUBAL LIGATION;  Surgeon: Sherian Rein, MD;  Location: [facility];  Service: Obstetrics;  Laterality: Bilateral;    There were no vitals filed for this visit.   Subjective Assessment - 01/07/18 1236    Subjective  The patient underwent a right foot surgery on 10/14/17.  She has been in regular footwear since 12/24/17.  Her pain-level is a 6/10 today but can increase with more walking and standing.  She has been active ankle exercises at home.  She c/o swelling.  Elevation, ice and meds decrease her pain.  Her goal is to decrease her pain and be able to walk better and longer.    Pertinent History  10 year h/o right ankle and foot pain.    Limitations  Standing    How long can you stand comfortably?  10 minutes.    How long can you walk comfortably?  No more than 2 blocks.    Patient Stated Goals  See above.    Currently in Pain?  Yes    Pain Score  6     Pain Location  Ankle    Pain Orientation  Right    Pain Descriptors / Indicators  Aching    Pain Type  Surgical pain    Pain Onset  More than a month ago    Pain Frequency  Constant    Aggravating Factors   See above.    Pain Relieving Factors  See above.         [facility] PT Assessment - 01/07/18 0001      Assessment   Medical Diagnosis  Tibialis posterior tendinitis, right.    Referring Provider  Alfredo Martinez PA-C    Onset Date/Surgical Date  --   10/14/17 (surgery date).     Precautions   Precautions  None      Restrictions   Weight Bearing Restrictions  No      Balance Screen   Has the patient fallen in the past 6 months  Yes    How many times?  --   1.   Has the patient had a decrease in activity level because of a fear of falling?   Yes    Is the patient reluctant to leave their home because of a fear  of falling?   No      Home Environment   Living Environment  Private residence      Prior Function   Level of Independence  Independent      Observation/Other Assessments   Observations  Right foot and calf incisional sites are well healed.    Focus on Therapeutic Outcomes (FOTO)   67% limitation.      Observation/Other Assessments-Edema    Edema  Circumferential   Bi-malleolar.     Circumferential Edema   Circumferential - Right  RT 3.5 cms > LT.      ROM / Strength   AROM / PROM / Strength  AROM      AROM   Overall AROM Comments  Right ankle active dorsiflexion= 10 degrees, inversion= 17 degrees, eversion= 8 degrees and plantarflexion= 35 degrees.      Palpation   Palpation comment  Tender to palpation at right Achilles insertion on calcaneus and medial right foot incisional site where there is also a pocket of localized edema.      Ambulation/Gait   Gait Comments  No heel strike and patient is walking on the outside of her right foot.                Objective measurements completed on examination: See above findings.      [facility] Adult PT Treatment/Exercise - 01/07/18 0001      Modalities   Modalities  Electrical Stimulation;Vasopneumatic      Electrical Stimulation   Electrical Stimulation Location  Right ankle.    Electrical Stimulation Action  IFC    Electrical Stimulation Parameters  1-10 Hz x 15 minutes.    Electrical Stimulation Goals  Edema;Pain      Vasopneumatic   Number Minutes Vasopneumatic   15 minutes    Vasopnuematic Location   --   Right ankle.   Vasopneumatic Pressure  Medium                  PT Long Term Goals - 01/07/18 1302      PT LONG TERM GOAL #1   Title  Independent with a HEP.    Time  4    Period  Weeks    Status  New      PT LONG TERM GOAL #2   Title  Increase right ankle strength to 4+ to 5/5 to increase stability for functional tasks.    Time  4  Period  Weeks    Status  New      PT LONG TERM GOAL  #3   Title  Stand 20 minutes with pain not > 2-3/10.    Time  4    Period  Weeks    Status  New      PT LONG TERM GOAL #4   Title  Normalize gait pattern.    Time  4    Period  Weeks    Status  New      PT LONG TERM GOAL #5   Title  Walk a community distance with pain not > 2-3/10.    Time  4    Period  Weeks    Status  New             Plan - 01/07/18 1250    Clinical Impression Statement  The patient presents to OPPT s/p right gastroc recession; right posterior tibial tenolysis; FDL transfer to navicular; right calcaneal osteotomy and spring ligament repair. Her range of motion looks very good at this point in part due to her compliance to her HEP (ie:  Ankle pumps; ankle ABC's...).  She continues to have a significant amount of right ankle and foot edema which increases with standing and walking.  She lacks heel strike and walks on the outside of her foot.  Patient will benefit from skilled physical therapy intervention to address deficits and pain that have resulted in impaired functional mobility.    History and Personal Factors relevant to plan of care:  DM; long h/o right ankle and foot pain.    Clinical Presentation  Stable    Clinical Presentation due to:  Good surgical outcome.    Clinical Decision Making  Low    Rehab Potential  Excellent    PT Frequency  3x / week    PT Duration  4 weeks    PT Treatment/Interventions  ADLs/Self Care Home Management;Cryotherapy;Data processing manager;Therapeutic activities;Therapeutic exercise;Neuromuscular re-education;Patient/family education;Passive range of motion;Scar mobilization;Manual techniques;Vasopneumatic Device    PT Next Visit Plan  Begin with seated BAPS and Rockerboard to right foot/ankle with progression to standing per patient tolerance.  Nustep and stationary bike.  4 way right ankle theraband exercises; ankle isolator.  Vasopnuematic and electrical stimulation.         Patient will benefit from skilled therapeutic intervention in order to improve the following deficits and impairments:  Abnormal gait, Decreased activity tolerance, Increased edema, Difficulty walking, Pain  Visit Diagnosis: Pain in right ankle and joints of right foot - Plan: PT plan of care cert/re-cert  Localized edema - Plan: PT plan of care cert/re-cert     Problem List Patient Active Problem List   Diagnosis Date Noted  . S/P cesarean section 10/12/2013  . Normal pregnancy, repeat 10/11/2013    APPLE[place], [place] MPT 01/07/2018, 1:06 PM  [facility] [address] [place], [place], [address] Phone: [phone]   Fax:  [phone]  Name: Christy Gay MRN: [id number] Date of Birth: [birth year]

## 2018-01-11 ENCOUNTER — Ambulatory Visit: Payer: BC Managed Care – PPO | Admitting: Physical Therapy

## 2018-01-11 ENCOUNTER — Encounter: Payer: Self-pay | Admitting: Physical Therapy

## 2018-01-11 ENCOUNTER — Telehealth: Payer: Self-pay | Admitting: Family Medicine

## 2018-01-11 DIAGNOSIS — M25571 Pain in right ankle and joints of right foot: Secondary | ICD-10-CM | POA: Diagnosis not present

## 2018-01-11 DIAGNOSIS — R600 Localized edema: Secondary | ICD-10-CM

## 2018-01-11 NOTE — Therapy (Signed)
[facility] [address] [place], [place], [address] Phone: [phone]   Fax:  [phone]  Physical Therapy Treatment  Patient Details  Name: Christy Gay MRN: [id number] Date of Birth: [birth year] Referring Provider: Alfredo Martinez PA-C   Encounter Date: 01/11/2018  PT End of Session - 01/11/18 1649    Visit Number  2    Number of Visits  12    Date for PT Re-Evaluation  02/04/18    Authorization Type  FOTO AT LEAST EVERY 5TH VISIT, 10TH VISIT PROGRESS NOTE.    PT Start Time  1648    PT Stop Time  1737    PT Time Calculation (min)  49 min    Activity Tolerance  Patient tolerated treatment well    Behavior During Therapy  WFL for tasks assessed/performed       Past Medical History:  Diagnosis Date  . Arthritis    neck, upper back  . 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 Diabetes mellitus without complication (HCC)    gestational with first pregnancy  . Headache(784.0)    migraines  . Normal pregnancy, repeat 10/11/2013    Past Surgical History:  Procedure Laterality Date  . CALCANEAL OSTEOTOMY Left 10/14/2017   Procedure: Right Calcaneal Osteotomy;  Surgeon: Toni Arthurs, MD;  Location: [facility];  Service: Orthopedics;  Laterality: Left;  . CERVICAL ABLATION    . CESAREAN SECTION    . CESAREAN SECTION N/A 10/12/2013   Procedure: CESAREAN SECTION;  Surgeon: Sherian Rein, MD;  Location: [facility];  Service: Obstetrics;  Laterality: N/A;  . GASTROCNEMIUS RECESSION Right 10/14/2017   Procedure: Right Gastroc Recession;  Surgeon: Toni Arthurs, MD;  Location: [facility];  Service: Orthopedics;  Laterality: Right;  . KIDNEY SURGERY    . LIGAMENT REPAIR Right 10/14/2017   Procedure: Right Spring Ligament Repair;  Surgeon: Toni Arthurs, MD;  Location: [facility];  Service: Orthopedics;  Laterality: Right;  . TENOLYSIS Right 10/14/2017   Procedure: Right  Posterior Tibial Tenolysis;  Surgeon: Toni Arthurs, MD;  Location: [facility];  Service: Orthopedics;  Laterality: Right;  . TONSILLECTOMY    . TUBAL LIGATION Bilateral 10/12/2013   Procedure: BILATERAL TUBAL LIGATION;  Surgeon: Sherian Rein, MD;  Location: [facility];  Service: Obstetrics;  Laterality: Bilateral;    There were no vitals filed for this visit.  Subjective Assessment - 01/11/18 1648    Subjective  Reports having some pain in R foot.    Pertinent History  10 year h/o right ankle and foot pain.    Limitations  Standing    How long can you stand comfortably?  10 minutes.    How long can you walk comfortably?  No more than 2 blocks.    Patient Stated Goals  See above.    Currently in Pain?  Yes    Pain Score  6     Pain Location  Foot    Pain Orientation  Right    Pain Descriptors / Indicators  Throbbing    Pain Type  Surgical pain    Pain Onset  More than a month ago         [facility] PT Assessment - 01/11/18 0001      Assessment   Medical Diagnosis  Tibialis posterior tendinitis, right.    Onset Date/Surgical Date  10/14/17    Next MD Visit  01/21/2018  Precautions   Precautions  None      Restrictions   Weight Bearing Restrictions  No                   [facility] Adult PT Treatment/Exercise - 01/11/18 0001      Exercises   Exercises  Ankle      Modalities   Modalities  Electrical Stimulation;Vasopneumatic      Electrical Stimulation   Electrical Stimulation Location  R ankle    Electrical Stimulation Action  IFC    Electrical Stimulation Parameters  80-150 hz x15 min    Electrical Stimulation Goals  Edema;Pain      Vasopneumatic   Number Minutes Vasopneumatic   15 minutes    Vasopnuematic Location   Ankle    Vasopneumatic Pressure  Low    Vasopneumatic Temperature   34      Ankle Exercises: Seated   ABC's  1 rep    Ankle Circles/Pumps  AROM;Right;20 reps    Towel Crunch  Other (comment)   x1 min   Heel Raises   Both;20 reps    Toe Raise  20 reps    BAPS  Sitting;Level 3;Other (comment)   x20 reps   Other Seated Ankle Exercises  Rockerboard DF/PF x4 min, Ev/Inv x4 min    Other Seated Ankle Exercises  Dynadisc R ankle DF/PF, Inv/Ev, circles x8 min             PT Education - 01/11/18 1726    Education Details  HEP- seated heel/toe raises, ABCs, ankle circles     Person(s) Educated  Patient    Methods  Explanation;Demonstration;Handout    Comprehension  Verbalized understanding          PT Long Term Goals - 01/11/18 1727      PT LONG TERM GOAL #1   Title  Independent with a HEP.    Time  4    Period  Weeks    Status  On-going      PT LONG TERM GOAL #2   Title  Increase right ankle strength to 4+ to 5/5 to increase stability for functional tasks.    Time  4    Period  Weeks    Status  On-going      PT LONG TERM GOAL #3   Title  Stand 20 minutes with pain not > 2-3/10.    Time  4    Period  Weeks    Status  On-going      PT LONG TERM GOAL #4   Title  Normalize gait pattern.    Time  4    Period  Weeks    Status  On-going      PT LONG TERM GOAL #5   Title  Walk a community distance with pain not > 2-3/10.    Time  4    Period  Weeks    Status  On-going            Plan - 01/11/18 1727    Clinical Impression Statement  Patient tolerated today's treatment well even as she arrived with 6/10 R ankle pain. Patient minimally limited with PF as noted during observation of heel raises. No pain reported by patient with exercises until she reported a painful cramp like sensation in the R medial arch. Patient experiences cramping sensation in R medial arch approximately twice a day especially at night but sometimes in the morning. Patient's R ankle observed with increased edema noted  along B malleoli. Patient provided HEP for ROM and light strengthening. Patient educated regarding parameters and technique with verbalization of understanding. Patient also instructed to stop HEP  if any pain began with exercises as to avoid further injury. Normal modalities response noted following removal of the modalities. Patient reported R calf fatigue and discomfort upon end of treatment from exercise.    Rehab Potential  Excellent    PT Frequency  3x / week    PT Duration  4 weeks    PT Treatment/Interventions  ADLs/Self Care Home Management;Cryotherapy;Data processing manager;Therapeutic activities;Therapeutic exercise;Neuromuscular re-education;Patient/family education;Passive range of motion;Scar mobilization;Manual techniques;Vasopneumatic Device    PT Next Visit Plan  Continue R ankle strengthening and ROM in sitting with modalities PRN per MPT POC.    PT Home Exercise Plan  HEP- ankle circles, ankle ABCs, seated heel raises, seated toe raises    Consulted and Agree with Plan of Care  Patient       Patient will benefit from skilled therapeutic intervention in order to improve the following deficits and impairments:  Abnormal gait, Decreased activity tolerance, Increased edema, Difficulty walking, Pain  Visit Diagnosis: Pain in right ankle and joints of right foot  Localized edema     Problem List Patient Active Problem List   Diagnosis Date Noted  . S/P cesarean section 10/12/2013  . Normal pregnancy, repeat 10/11/2013    Marvell Fuller, PTA 01/11/2018, 5:43 PM  [facility] [address] [place], [place], [address] Phone: [phone]   Fax:  [phone]  Name: NADIRAH SOCORRO MRN: [id number] Date of Birth: Sep 08, 1975

## 2018-01-13 ENCOUNTER — Encounter: Payer: Self-pay | Admitting: Physical Therapy

## 2018-01-13 ENCOUNTER — Ambulatory Visit: Payer: BC Managed Care – PPO | Admitting: Physical Therapy

## 2018-01-13 DIAGNOSIS — M25571 Pain in right ankle and joints of right foot: Secondary | ICD-10-CM | POA: Diagnosis not present

## 2018-01-13 DIAGNOSIS — R600 Localized edema: Secondary | ICD-10-CM
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
## 2018-01-18 ENCOUNTER — Ambulatory Visit: Payer: BC Managed Care – PPO | Admitting: Physical Therapy

## 2018-01-18 ENCOUNTER — Encounter: Payer: Self-pay | Admitting: Physical Therapy

## 2018-01-18 DIAGNOSIS — R600 Localized edema: Secondary | ICD-10-CM

## 2018-01-18 DIAGNOSIS — M25571 Pain in right ankle and joints of right foot: Secondary | ICD-10-CM | POA: Diagnosis not present
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
## 2018-01-20 ENCOUNTER — Ambulatory Visit: Payer: BC Managed Care – PPO | Admitting: *Deleted

## 2018-01-20 DIAGNOSIS — M25571 Pain in right ankle and joints of right foot: Secondary | ICD-10-CM | POA: Diagnosis not present

## 2018-01-20 DIAGNOSIS — R600 Localized edema: Secondary | ICD-10-CM
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
## 2018-01-25 ENCOUNTER — Ambulatory Visit: Payer: BC Managed Care – PPO | Admitting: Physical Therapy

## 2018-01-25 ENCOUNTER — Encounter: Payer: Self-pay | Admitting: Physical Therapy

## 2018-01-25 DIAGNOSIS — R600 Localized edema: Secondary | ICD-10-CM

## 2018-01-25 DIAGNOSIS — M25571 Pain in right ankle and joints of right foot: Secondary | ICD-10-CM
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
## 2018-01-28 ENCOUNTER — Encounter: Payer: BC Managed Care – PPO | Admitting: Physical Therapy

## 2018-02-01 ENCOUNTER — Encounter: Payer: BC Managed Care – PPO | Admitting: Physical Therapy

## 2018-02-03 ENCOUNTER — Ambulatory Visit: Payer: BC Managed Care – PPO | Attending: Student | Admitting: Physical Therapy

## 2018-02-03 DIAGNOSIS — R600 Localized edema: Secondary | ICD-10-CM | POA: Insufficient documentation

## 2018-02-03 DIAGNOSIS — M25571 Pain in right ankle and joints of right foot: Secondary | ICD-10-CM | POA: Diagnosis present
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
## 2018-02-08 ENCOUNTER — Encounter: Payer: Self-pay | Admitting: Physical Therapy

## 2018-02-08 ENCOUNTER — Ambulatory Visit: Payer: BC Managed Care – PPO | Admitting: Physical Therapy

## 2018-02-08 DIAGNOSIS — M25571 Pain in right ankle and joints of right foot: Secondary | ICD-10-CM

## 2018-02-08 DIAGNOSIS — R600 Localized edema: Secondary | ICD-10-CM
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
## 2018-02-10 ENCOUNTER — Encounter: Payer: BC Managed Care – PPO | Admitting: *Deleted

## 2018-02-15 ENCOUNTER — Encounter: Payer: BC Managed Care – PPO | Admitting: *Deleted

## 2018-02-17 ENCOUNTER — Encounter: Payer: BC Managed Care – PPO | Admitting: Physical Therapy

## 2018-02-24 ENCOUNTER — Ambulatory Visit: Payer: BC Managed Care – PPO | Admitting: Physical Therapy

## 2018-02-24 ENCOUNTER — Encounter: Payer: Self-pay | Admitting: Physical Therapy

## 2018-02-24 DIAGNOSIS — M25571 Pain in right ankle and joints of right foot: Secondary | ICD-10-CM | POA: Diagnosis not present

## 2018-02-24 DIAGNOSIS — R600 Localized edema: Secondary | ICD-10-CM
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
## 2018-02-28 ENCOUNTER — Encounter: Payer: Self-pay | Admitting: Physical Therapy

## 2018-02-28 ENCOUNTER — Ambulatory Visit: Payer: BC Managed Care – PPO | Admitting: Physical Therapy

## 2018-02-28 DIAGNOSIS — M25571 Pain in right ankle and joints of right foot: Secondary | ICD-10-CM | POA: Diagnosis not present

## 2018-02-28 DIAGNOSIS — R600 Localized edema: Secondary | ICD-10-CM
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
## 2018-03-01 ENCOUNTER — Ambulatory Visit: Payer: BC Managed Care – PPO | Admitting: Physical Therapy

## 2018-03-01 ENCOUNTER — Encounter: Payer: Self-pay | Admitting: Physical Therapy

## 2018-03-01 DIAGNOSIS — M25571 Pain in right ankle and joints of right foot: Secondary | ICD-10-CM | POA: Diagnosis not present

## 2018-03-01 DIAGNOSIS — R600 Localized edema: Secondary | ICD-10-CM
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
## 2018-03-08 ENCOUNTER — Ambulatory Visit: Payer: BC Managed Care – PPO | Attending: Student | Admitting: Physical Therapy

## 2018-03-08 ENCOUNTER — Encounter: Payer: Self-pay | Admitting: Physical Therapy

## 2018-03-08 DIAGNOSIS — M25571 Pain in right ankle and joints of right foot: Secondary | ICD-10-CM

## 2018-03-08 DIAGNOSIS — R600 Localized edema: Secondary | ICD-10-CM | POA: Insufficient documentation
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
## 2018-03-14 ENCOUNTER — Ambulatory Visit: Payer: BC Managed Care – PPO | Admitting: Physical Therapy

## 2018-03-14 ENCOUNTER — Ambulatory Visit (INDEPENDENT_AMBULATORY_CARE_PROVIDER_SITE_OTHER): Payer: BC Managed Care – PPO

## 2018-03-14 ENCOUNTER — Encounter: Payer: Self-pay | Admitting: Physical Therapy

## 2018-03-14 DIAGNOSIS — R600 Localized edema: Secondary | ICD-10-CM

## 2018-03-14 DIAGNOSIS — M25571 Pain in right ankle and joints of right foot: Secondary | ICD-10-CM | POA: Diagnosis not present

## 2018-03-14 DIAGNOSIS — Z23 Encounter for immunization: Secondary | ICD-10-CM | POA: Diagnosis not present
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
## 2018-03-22 ENCOUNTER — Ambulatory Visit: Payer: BC Managed Care – PPO | Admitting: Physical Therapy

## 2018-03-22 ENCOUNTER — Encounter: Payer: Self-pay | Admitting: Physical Therapy

## 2018-03-22 DIAGNOSIS — M25571 Pain in right ankle and joints of right foot: Secondary | ICD-10-CM | POA: Diagnosis not present

## 2018-03-22 DIAGNOSIS — R600 Localized edema: Secondary | ICD-10-CM
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
## 2018-03-30 ENCOUNTER — Encounter: Payer: Self-pay | Admitting: Physical Therapy

## 2018-03-30 ENCOUNTER — Ambulatory Visit: Payer: BC Managed Care – PPO | Admitting: Physical Therapy

## 2018-03-30 DIAGNOSIS — M25571 Pain in right ankle and joints of right foot: Secondary | ICD-10-CM

## 2018-03-30 DIAGNOSIS — R600 Localized edema: Secondary | ICD-10-CM
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
## 2018-04-05 ENCOUNTER — Ambulatory Visit: Payer: BC Managed Care – PPO | Attending: Student | Admitting: Physical Therapy

## 2018-04-05 ENCOUNTER — Encounter: Payer: Self-pay | Admitting: Physical Therapy

## 2018-04-05 DIAGNOSIS — R600 Localized edema: Secondary | ICD-10-CM | POA: Diagnosis present

## 2018-04-05 DIAGNOSIS — M25571 Pain in right ankle and joints of right foot: Secondary | ICD-10-CM | POA: Diagnosis present
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
## 2018-04-12 ENCOUNTER — Encounter: Payer: Self-pay | Admitting: Physical Therapy

## 2018-04-12 ENCOUNTER — Ambulatory Visit: Payer: BC Managed Care – PPO | Admitting: Physical Therapy

## 2018-04-12 DIAGNOSIS — M25571 Pain in right ankle and joints of right foot: Secondary | ICD-10-CM

## 2018-04-12 DIAGNOSIS — R600 Localized edema: Secondary | ICD-10-CM
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
## 2018-04-19 ENCOUNTER — Encounter: Payer: Self-pay | Admitting: Physical Therapy

## 2018-04-19 ENCOUNTER — Ambulatory Visit: Payer: BC Managed Care – PPO | Admitting: Physical Therapy

## 2018-04-19 DIAGNOSIS — R600 Localized edema: Secondary | ICD-10-CM

## 2018-04-19 DIAGNOSIS — M25571 Pain in right ankle and joints of right foot: Secondary | ICD-10-CM
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
## 2018-04-26 ENCOUNTER — Encounter: Payer: Self-pay | Admitting: Physical Therapy

## 2018-04-26 ENCOUNTER — Ambulatory Visit: Payer: BC Managed Care – PPO | Admitting: Physical Therapy

## 2018-04-26 DIAGNOSIS — M25571 Pain in right ankle and joints of right foot: Secondary | ICD-10-CM | POA: Diagnosis not present

## 2018-04-26 DIAGNOSIS — R600 Localized edema: Secondary | ICD-10-CM
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
## 2018-04-28 ENCOUNTER — Ambulatory Visit: Payer: BC Managed Care – PPO | Admitting: Physical Therapy

## 2018-04-28 ENCOUNTER — Encounter: Payer: Self-pay | Admitting: Physical Therapy

## 2018-04-28 DIAGNOSIS — M25571 Pain in right ankle and joints of right foot: Secondary | ICD-10-CM

## 2018-04-28 DIAGNOSIS — R600 Localized edema: Secondary | ICD-10-CM
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
## 2018-05-03 ENCOUNTER — Ambulatory Visit: Payer: BC Managed Care – PPO | Admitting: Physical Therapy

## 2018-05-03 ENCOUNTER — Encounter: Payer: Self-pay | Admitting: Physical Therapy

## 2018-05-03 DIAGNOSIS — M25571 Pain in right ankle and joints of right foot: Secondary | ICD-10-CM | POA: Diagnosis not present

## 2018-05-03 DIAGNOSIS — R600 Localized edema: Secondary | ICD-10-CM
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
## 2019-01-13 ENCOUNTER — Other Ambulatory Visit: Payer: Self-pay

## 2019-01-16 ENCOUNTER — Ambulatory Visit: Payer: BC Managed Care – PPO | Admitting: Family Medicine

## 2019-01-16 ENCOUNTER — Other Ambulatory Visit: Payer: Self-pay

## 2019-01-16 ENCOUNTER — Encounter: Payer: Self-pay | Admitting: Family Medicine

## 2019-01-16 ENCOUNTER — Ambulatory Visit: Payer: BC Managed Care – PPO | Admitting: Family

## 2019-01-16 VITALS — BP 120/76 | HR 67 | Temp 96.0°F | Ht 62.0 in | Wt 240.6 lb

## 2019-01-16 DIAGNOSIS — M545 Low back pain, unspecified: Secondary | ICD-10-CM

## 2019-01-16 DIAGNOSIS — N3090 Cystitis, unspecified without hematuria: Secondary | ICD-10-CM | POA: Diagnosis not present

## 2019-01-16 LAB — MICROSCOPIC EXAMINATION: Renal Epithel, UA: NONE SEEN /hpf

## 2019-01-16 LAB — URINALYSIS, COMPLETE
Bilirubin, UA: NEGATIVE
Glucose, UA: NEGATIVE
Ketones, UA: NEGATIVE
Leukocytes,UA: NEGATIVE
Nitrite, UA: POSITIVE — AB
Protein,UA: NEGATIVE
Specific Gravity, UA: >1.03 — ABNORMAL HIGH (ref 1.005–1.030)
Urobilinogen, Ur: 0.2 mg/dL (ref 0.2–1.0)
pH, UA: 5.5 (ref 5.0–7.5)

## 2019-01-16 MED ORDER — CIPROFLOXACIN HCL 500 MG PO TABS: 500.0000 mg | ORAL_TABLET | Freq: Two times a day (BID) | ORAL | 0 refills | Status: DC

## 2019-01-16 NOTE — Progress Notes (Signed)
Subjective:  Patient ID: Christy Gay, female    DOB: May 24, 1975  Age: 43 y.o. MRN: [id number]  CC: Back Pain (x 1 month follow up)   HPI GER NICKS presents for burning with urination and frequency for one month. Denies fever . Moderate intermittent right flank pain. No nausea, vomiting.   Depression screen [facility] 2/9 01/16/2019 07/05/2017 03/29/2017  Decreased Interest 0 0 0  Down, Depressed, Hopeless 0 0 0  PHQ - 2 Score 0 0 0  Altered sleeping - - -  Tired, decreased energy - - -  Change in appetite - - -  Feeling bad or failure about yourself  - - -  Trouble concentrating - - -  Moving slowly or fidgety/restless - - -  Suicidal thoughts - - -  PHQ-9 Score - - -    History Vidya has a past medical history of Arthritis, Chronic kidney disease, Diabetes mellitus without complication ([place]), NWGNFAOZ(308.6), and Normal pregnancy, repeat (10/11/2013).   She has a past surgical history that includes Tonsillectomy; Cesarean section; Kidney surgery; Cesarean section (N/A, 10/12/2013); Tubal ligation (Bilateral, 10/12/2013); Cervical ablation; Gastrocnemius Recession (Right, 10/14/2017); Tenolysis (Right, 10/14/2017); Calcaneal osteotomy (Left, 10/14/2017); and Ligament repair (Right, 10/14/2017).   Her family history is not on file.She reports that she has never smoked. She has never used smokeless tobacco. She reports that she does not drink alcohol or use drugs.    ROS Review of Systems  Constitutional: Negative for chills, diaphoresis and fever.  HENT: Negative for congestion.   Eyes: Negative for visual disturbance.  Respiratory: Negative for cough and shortness of breath.   Cardiovascular: Negative for chest pain and palpitations.  Gastrointestinal: Negative for constipation, diarrhea and nausea.  Genitourinary: Positive for dysuria, frequency and urgency. Negative for decreased urine volume, flank pain, hematuria, menstrual problem and pelvic pain.  Musculoskeletal: Negative  for arthralgias and joint swelling.  Skin: Negative for rash.  Neurological: Negative for dizziness and numbness.    Objective:  BP 120/76   Pulse 67   Temp (!) 96 F (35.6 C) (Temporal)   Ht 5\' 2"  (1.575 m)   Wt 240 lb 9.6 oz (109.1 kg)   SpO2 99%   BMI 44.01 kg/m   BP Readings from Last 3 Encounters:  01/16/19 120/76  12/18/17 102/69  10/14/17 104/77    Wt Readings from Last 3 Encounters:  01/16/19 240 lb 9.6 oz (109.1 kg)  12/18/17 251 lb 8 oz (114.1 kg)  10/14/17 235 lb (106.6 kg)     Physical Exam Constitutional:      Appearance: She is well-developed.  HENT:     Head: Normocephalic and atraumatic.  Cardiovascular:     Rate and Rhythm: Normal rate and regular rhythm.     Heart sounds: No murmur.  Pulmonary:     Effort: Pulmonary effort is normal.     Breath sounds: Normal breath sounds.  Abdominal:     General: Bowel sounds are normal.     Palpations: Abdomen is soft. There is no mass.     Tenderness: There is no abdominal tenderness. There is no guarding or rebound.  Musculoskeletal:        General: No tenderness.  Skin:    General: Skin is warm and dry.  Neurological:     Mental Status: She is alert and oriented to person, place, and time.  Psychiatric:        Behavior: Behavior normal.       Assessment & Plan:  [name] was seen today for back pain.  Diagnoses and all orders for this visit:  Acute low back pain, unspecified back pain laterality, unspecified whether sciatica present -     Urinalysis, Complete -     Urine Culture  Cystitis  Other orders -     Discontinue: ciprofloxacin (CIPRO) 500 MG tablet; Take 1 tablet (500 mg total) by mouth 2 (two) times daily. -     Microscopic Examination -     nitrofurantoin, macrocrystal-monohydrate, (MACROBID) 100 MG capsule; Take 1 capsule (100 mg total) by mouth 2 (two) times daily.       I have discontinued [name] B. Pridgen's amoxicillin-clavulanate and ciprofloxacin. I am also having her  start on nitrofurantoin (macrocrystal-monohydrate). Additionally, I am having her maintain her ibuprofen and Naproxen Sodium (ALEVE PO).  Allergies as of 01/16/2019      Reactions   Keflex [cephalexin] Rash      Medication List       Accurate as of January 16, 2019 11:59 PM. If you have any questions, ask your nurse or doctor.        STOP taking these medications   amoxicillin-clavulanate 875-125 MG tablet Commonly known as: AUGMENTIN Stopped by: Mechele ClaudeWarren Anik Wesch, MD     TAKE these medications   ALEVE PO Take by mouth.   ibuprofen 800 MG tablet Commonly known as: ADVIL Take 1 tablet (800 mg total) by mouth every 8 (eight) hours as needed.   nitrofurantoin (macrocrystal-monohydrate) 100 MG capsule Commonly known as: Macrobid Take 1 capsule (100 mg total) by mouth 2 (two) times daily. Started by: Mechele ClaudeWarren Doyt Castellana, MD        Follow-up: Return if symptoms worsen or fail to improve.  Mechele ClaudeWarren Hosea Hanawalt, M.D.

## 2019-01-18 LAB — URINE CULTURE

## 2019-01-19 MED ORDER — NITROFURANTOIN MONOHYD MACRO 100 MG PO CAPS: 100.0000 mg | ORAL_CAPSULE | Freq: Two times a day (BID) | ORAL | 0 refills | Status: DC

## 2019-01-23 ENCOUNTER — Encounter: Payer: Self-pay | Admitting: Family Medicine

## 2019-03-14 ENCOUNTER — Other Ambulatory Visit: Payer: Self-pay

## 2019-03-15 ENCOUNTER — Ambulatory Visit (INDEPENDENT_AMBULATORY_CARE_PROVIDER_SITE_OTHER): Payer: BC Managed Care – PPO

## 2019-03-15 DIAGNOSIS — Z23 Encounter for immunization: Secondary | ICD-10-CM

## 2019-05-23 ENCOUNTER — Other Ambulatory Visit: Payer: Self-pay

## 2019-05-23 ENCOUNTER — Encounter: Payer: Self-pay | Admitting: Family Medicine

## 2019-05-23 ENCOUNTER — Ambulatory Visit (INDEPENDENT_AMBULATORY_CARE_PROVIDER_SITE_OTHER): Payer: BC Managed Care – PPO | Admitting: Family Medicine

## 2019-05-23 VITALS — BP 116/77 | HR 67 | Temp 96.4°F | Resp 20 | Ht 62.0 in | Wt 239.0 lb

## 2019-05-23 DIAGNOSIS — M62838 Other muscle spasm: Secondary | ICD-10-CM | POA: Diagnosis not present

## 2019-05-23 DIAGNOSIS — N390 Urinary tract infection, site not specified: Secondary | ICD-10-CM | POA: Diagnosis not present

## 2019-05-23 DIAGNOSIS — R109 Unspecified abdominal pain: Secondary | ICD-10-CM

## 2019-05-23 DIAGNOSIS — B9620 Unspecified Escherichia coli [E. coli] as the cause of diseases classified elsewhere: Secondary | ICD-10-CM

## 2019-05-23 LAB — URINALYSIS, COMPLETE
Bilirubin, UA: NEGATIVE
Glucose, UA: NEGATIVE
Ketones, UA: NEGATIVE
Leukocytes,UA: NEGATIVE
Nitrite, UA: NEGATIVE
Protein,UA: NEGATIVE
RBC, UA: NEGATIVE
Specific Gravity, UA: 1.025 (ref 1.005–1.030)
Urobilinogen, Ur: 0.2 mg/dL (ref 0.2–1.0)
pH, UA: 7 (ref 5.0–7.5)

## 2019-05-23 LAB — MICROSCOPIC EXAMINATION
Bacteria, UA: NONE SEEN
RBC, Urine: NONE SEEN /hpf (ref 0–2)
Renal Epithel, UA: NONE SEEN /hpf

## 2019-05-23 MED ORDER — CYCLOBENZAPRINE HCL 5 MG PO TABS: 5.0000 mg | ORAL_TABLET | Freq: Three times a day (TID) | ORAL | 0 refills | Status: AC | PRN
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
## 2019-05-23 NOTE — Patient Instructions (Signed)
Muscle Cramps and Spasms Muscle cramps and spasms are when muscles tighten by themselves. They usually get better within minutes. Muscle cramps are painful. They are usually stronger and last longer than muscle spasms. Muscle spasms may or may not be painful. They can last a few seconds or much longer. Cramps and spasms can affect any muscle, but they occur most often in the calf muscles of the leg. They are usually not caused by a serious problem. In many cases, the cause is not known. Some common causes include:  Doing more physical work or exercise than your body is ready for.  Using the muscles too much (overuse) by repeating certain movements too many times.  Staying in a certain position for a long time.  Playing a sport or doing an activity without preparing properly.  Using bad form or technique while playing a sport or doing an activity.  Not having enough water in your body (dehydration).  Injury.  Side effects of some medicines.  Low levels of the salts and minerals in your blood (electrolytes), such as low potassium or calcium. Follow these instructions at home: Managing pain and stiffness      Massage, stretch, and relax the muscle. Do this for many minutes at a time.  If told, put heat on tight or tense muscles as often as told by your doctor. Use the heat source that your doctor recommends, such as a moist heat pack or a heating pad. ? Place a towel between your skin and the heat source. ? Leave the heat on for 20-30 minutes. ? Remove the heat if your skin turns bright red. This is very important if you are not able to feel pain, heat, or cold. You may have a greater risk of getting burned.  If told, put ice on the affected area. This may help if you are sore or have pain after a cramp or spasm. ? Put ice in a plastic bag. ? Place a towel between your skin and the bag. ? Leave the ice on for 20 minutes, 2-3 times a day.  Try taking hot showers or baths to help  relax tight muscles. Eating and drinking  Drink enough fluid to keep your pee (urine) pale yellow.  Eat a healthy diet to help ensure that your muscles work well. This should include: ? Fruits and vegetables. ? Lean protein. ? Whole grains. ? Low-fat or nonfat dairy products. General instructions  If you are having cramps often, avoid intense exercise for several days.  Take over-the-counter and prescription medicines only as told by your doctor.  Watch for any changes in your symptoms.  Keep all follow-up visits as told by your doctor. This is important. Contact a doctor if:  Your cramps or spasms get worse or happen more often.  Your cramps or spasms do not get better with time. Summary  Muscle cramps and spasms are when muscles tighten by themselves. They usually get better within minutes.  Cramps and spasms occur most often in the calf muscles of the leg.  Massage, stretch, and relax the muscle. This may help the cramp or spasm go away.  Drink enough fluid to keep your pee (urine) pale yellow. This information is not intended to replace advice given to you by your health care provider. Make sure you discuss any questions you have with your health care provider. Document Revised: 09/13/2017 Document Reviewed: 09/13/2017 Elsevier Patient Education  2020 [employer]  

## 2019-05-25 LAB — URINE CULTURE

## 2019-05-25 MED ORDER — NITROFURANTOIN MONOHYD MACRO 100 MG PO CAPS: 100.0000 mg | ORAL_CAPSULE | Freq: Two times a day (BID) | ORAL | 0 refills | Status: AC

## 2019-05-25 NOTE — Addendum Note (Signed)
Addended by: Sonny Masters on: 05/25/2019 10:49 AM   Modules accepted: Orders

## 2019-07-09 ENCOUNTER — Ambulatory Visit: Payer: BC Managed Care – PPO | Attending: Internal Medicine

## 2019-07-09 DIAGNOSIS — Z23 Encounter for immunization: Secondary | ICD-10-CM | POA: Insufficient documentation

## 2019-07-09 NOTE — Progress Notes (Signed)
   Covid-19 Vaccination Clinic  Name:  CHRISTELLA APP    MRN: [id number] DOB: 03-22-1976  07/09/2019  Ms. Tassinari was observed post Covid-19 immunization for 15 minutes without incident. She was provided with Vaccine Information Sheet and instruction to access the V-Safe system.   Ms. Hobdy was instructed to call 911 with any severe reactions post vaccine: Marland Kitchen Difficulty breathing  . Swelling of face and throat  . A fast heartbeat  . A bad rash all over body  . Dizziness and weakness   Immunizations Administered    Name Date Dose VIS Date Route   Pfizer COVID-19 Vaccine 07/09/2019  5:51 PM 0.3 mL 04/14/2019 Intramuscular   Manufacturer: [employer], [employer]   Lot: [id number]   NDC: [address]-1

## 2019-07-10 ENCOUNTER — Encounter: Payer: Self-pay | Admitting: Nurse Practitioner

## 2019-07-10 ENCOUNTER — Ambulatory Visit: Payer: BC Managed Care – PPO | Admitting: Nurse Practitioner

## 2019-07-10 ENCOUNTER — Telehealth: Payer: Self-pay | Admitting: Family Medicine

## 2019-07-10 ENCOUNTER — Other Ambulatory Visit: Payer: Self-pay

## 2019-07-10 VITALS — BP 102/70 | HR 74 | Temp 98.7°F | Resp 20 | Ht 62.0 in | Wt 235.0 lb

## 2019-07-10 DIAGNOSIS — R21 Rash and other nonspecific skin eruption: Secondary | ICD-10-CM

## 2019-07-10 DIAGNOSIS — T7840XA Allergy, unspecified, initial encounter: Secondary | ICD-10-CM

## 2019-07-10 MED ORDER — METHYLPREDNISOLONE ACETATE 80 MG/ML IJ SUSP
80.0000 mg | Freq: Once | INTRAMUSCULAR | Status: AC
  Administered 2019-07-10: 80 mg via INTRAMUSCULAR

## 2019-07-10 MED ORDER — PREDNISONE 20 MG PO TABS: ORAL_TABLET | ORAL | 0 refills | Status: DC

## 2019-07-10 NOTE — Telephone Encounter (Signed)
Patient states she got her first COVID vaccine yesterday.  Patient states that yesterday she started having a sore throat, lip swelling and itching all over.  States her throat feels like it does when she has strep throat.  States she is not having any trouble breathing to throat swelling.  Patient took benadryl last night and it put her to sleep.  She took another benadryl around 7am and she is still having itching all over her body.  Please advise.

## 2019-07-10 NOTE — Patient Instructions (Signed)
Hives Hives (urticaria) are itchy, red, swollen areas on the skin. Hives can appear on any part of the body. Hives often fade within 24 hours (acute hives). Sometimes, new hives appear after old ones fade and the cycle can continue for several days or weeks (chronic hives). Hives do not spread from person to person (are not contagious). Hives come from the body's reaction to something a person is allergic to (allergen), something that causes irritation, or various other triggers. When a person is exposed to a trigger, his or her body releases a chemical (histamine) that causes redness, itching, and swelling. Hives can appear right after exposure to a trigger or hours later. What are the causes? This condition may be caused by:  Allergies to foods or ingredients.  Insect bites or stings.  Exposure to pollen or pets.  Contact with latex or chemicals.  Spending time in sunlight, heat, or cold (exposure).  Exercise.  Stress.  Certain medicines. You can also get hives from other medical conditions and treatments, such as:  Viruses, including the common cold.  Bacterial infections, such as urinary tract infections and strep throat.  Certain medicines.  Allergy shots.  Blood transfusions. Sometimes, the cause of this condition is not known (idiopathic hives). What increases the risk? You are more likely to develop this condition if you:  Are a woman.  Have food allergies, especially to citrus fruits, milk, eggs, peanuts, tree nuts, or shellfish.  Are allergic to: ? Medicines. ? Latex. ? Insects. ? Animals. ? Pollen. What are the signs or symptoms? Common symptoms of this condition include raised, itchy, red or white bumps or patches on your skin. These areas may:  Become large and swollen (welts).  Change in shape and location, quickly and repeatedly.  Be separate hives or connect over a large area of skin.  Sting or become painful.  Turn white when pressed in the  center (blanch). In severe cases, yourhands, feet, and face may also become swollen. This may occur if hives develop deeper in your skin. How is this diagnosed? This condition may be diagnosed by your symptoms, medical history, and physical exam.  Your skin, urine, or blood may be tested to find out what is causing your hives and to rule out other health issues.  Your health care provider may also remove a small sample of skin from the affected area and examine it under a microscope (biopsy). How is this treated? Treatment for this condition depends on the cause and severity of your symptoms. Your health care provider may recommend using cool, wet cloths (cool compresses) or taking cool showers to relieve itching. Treatment may include:  Medicines that help: ? Relieve itching (antihistamines). ? Reduce swelling (corticosteroids). ? Treat infection (antibiotics).  An injectable medicine (omalizumab). Your health care provider may prescribe this if you have chronic idiopathic hives and you continue to have symptoms even after treatment with antihistamines. Severe cases may require an emergency injection of adrenaline (epinephrine) to prevent a life-threatening allergic reaction (anaphylaxis). Follow these instructions at home: Medicines  Take and apply over-the-counter and prescription medicines only as told by your health care provider.  If you were prescribed an antibiotic medicine, take it as told by your health care provider. Do not stop using the antibiotic even if you start to feel better. Skin care  Apply cool compresses to the affected areas.  Do not scratch or rub your skin. General instructions  Do not take hot showers or baths. This can make itching   worse.  Do not wear tight-fitting clothing.  Use sunscreen and wear protective clothing when you are outside.  Avoid any substances that cause your hives. Keep a journal to help track what causes your hives. Write  down: ? What medicines you take. ? What you eat and drink. ? What products you use on your skin.  Keep all follow-up visits as told by your health care provider. This is important. Contact a health care provider if:  Your symptoms are not controlled with medicine.  Your joints are painful or swollen. Get help right away if:  You have a fever.  You have pain in your abdomen.  Your tongue or lips are swollen.  Your eyelids are swollen.  Your chest or throat feels tight.  You have trouble breathing or swallowing. These symptoms may represent a serious problem that is an emergency. Do not wait to see if the symptoms will go away. Get medical help right away. Call your local emergency services (911 in the [place] Do not drive yourself to the hospital. Summary  Hives (urticaria) are itchy, red, swollen areas on your skin. Hives come from the body's reaction to something a person is allergic to (allergen), something that causes irritation, or various other triggers.  Treatment for this condition depends on the cause and severity of your symptoms.  Avoid any substances that cause your hives. Keep a journal to help track what causes your hives.  Take and apply over-the-counter and prescription medicines only as told by your health care provider.  Keep all follow-up visits as told by your health care provider. This is important. This information is not intended to replace advice given to you by your health care provider. Make sure you discuss any questions you have with your health care provider. Document Revised: 11/03/2017 Document Reviewed: 11/03/2017 Elsevier Patient Education  2020 [employer]  

## 2019-07-10 NOTE — Telephone Encounter (Signed)
She likely needs steroid. Please put her on the same day schedule for a visit.

## 2019-07-10 NOTE — Telephone Encounter (Signed)
Pts called stating that she got her 1st COVID shot yesterday and since then her throat has been hurting, lips are swollen, and itching all over. Needs advice. Says she has been taking Benadryl but doesn't seem to be helping much.

## 2019-07-10 NOTE — Progress Notes (Signed)
   Subjective:    Patient ID: Christy Gay, female    DOB: 08/20/1975, 44 y.o.   MRN: [id number]   Chief Complaint: Sore Throat (1st Covid shot yesterday), Wheezing, and rash all over   HPI Patient come sin c/o itching all over , lips swollen and sore throat. She had her first pfizer vaccine yesterday and she started itching about 1 hour later. Denies any sob. She has been taking benadryl and pepcid at home.   Review of Systems  Constitutional: Negative.   Respiratory: Negative.   Cardiovascular: Negative.   Gastrointestinal: Negative.   Musculoskeletal: Negative.   Neurological: Negative.   Psychiatric/Behavioral: Negative.   All other systems reviewed and are negative.      Objective:   Physical Exam Vitals and nursing note reviewed.  Constitutional:      General: She is in acute distress.     Appearance: She is well-developed.  HENT:     Head:     Comments: Mild lip edema Cardiovascular:     Rate and Rhythm: Normal rate and regular rhythm.  Pulmonary:     Effort: Pulmonary effort is normal.     Breath sounds: Normal breath sounds.  Abdominal:     General: Bowel sounds are normal.     Palpations: Abdomen is soft.  Skin:    General: Skin is warm.     Comments: Patient is scratching all over Scattered fine erythematous rash all over body  Neurological:     Mental Status: She is alert.    BP 102/70   Pulse 74   Temp 98.7 F (37.1 C) (Temporal)   Resp 20   Ht 5\' 2"  (1.575 m)   Wt 235 lb (106.6 kg)   SpO2 99%   BMI 42.98 kg/m         Assessment & Plan:  PSALM ARMAN in today with chief complaint of Sore Throat (1st Covid shot yesterday), Wheezing, and rash all over   1. Allergic reaction to drug, initial encounter Avoid scratching Rub areas gently Oatmeal may help Benadryl OTC - methylPREDNISolone acetate (DEPO-MEDROL) injection 80 mg - predniSONE (DELTASONE) 20 MG tablet; 2 po at sametime daily for 5 days- start tomorrow  Dispense: 10  tablet; Refill: 0  May want to avoid second dose of covid vaccine.  The above assessment and management plan was discussed with the patient. The patient verbalized understanding of and has agreed to the management plan. Patient is aware to call the clinic if symptoms persist or worsen. Patient is aware when to return to the clinic for a follow-up visit. Patient educated on when it is appropriate to go to the emergency department.   Mary-Margaret Christy Bach, FNP

## 2019-07-10 NOTE — Telephone Encounter (Signed)
Appointment scheduled.

## 2019-07-12 ENCOUNTER — Encounter (HOSPITAL_COMMUNITY): Payer: Self-pay | Admitting: Emergency Medicine

## 2019-07-12 ENCOUNTER — Ambulatory Visit (INDEPENDENT_AMBULATORY_CARE_PROVIDER_SITE_OTHER): Payer: BC Managed Care – PPO | Admitting: Family Medicine

## 2019-07-12 ENCOUNTER — Encounter: Payer: Self-pay | Admitting: Family Medicine

## 2019-07-12 ENCOUNTER — Other Ambulatory Visit: Payer: Self-pay

## 2019-07-12 ENCOUNTER — Emergency Department (HOSPITAL_COMMUNITY): Payer: BC Managed Care – PPO

## 2019-07-12 ENCOUNTER — Emergency Department (HOSPITAL_COMMUNITY)
Admission: EM | Admit: 2019-07-12 | Discharge: 2019-07-12 | Disposition: A | Discharge status: Home / Self Care | Payer: BC Managed Care – PPO | Attending: Emergency Medicine | Admitting: Emergency Medicine

## 2019-07-12 DIAGNOSIS — L299 Pruritus, unspecified: Secondary | ICD-10-CM | POA: Insufficient documentation

## 2019-07-12 DIAGNOSIS — T7840XD Allergy, unspecified, subsequent encounter: Secondary | ICD-10-CM

## 2019-07-12 DIAGNOSIS — R6883 Chills (without fever): Secondary | ICD-10-CM

## 2019-07-12 DIAGNOSIS — Z79899 Other long term (current) drug therapy: Secondary | ICD-10-CM | POA: Diagnosis not present

## 2019-07-12 DIAGNOSIS — L2389 Allergic contact dermatitis due to other agents: Secondary | ICD-10-CM

## 2019-07-12 DIAGNOSIS — R0789 Other chest pain: Secondary | ICD-10-CM | POA: Diagnosis not present

## 2019-07-12 DIAGNOSIS — R0602 Shortness of breath: Secondary | ICD-10-CM | POA: Diagnosis not present

## 2019-07-12 LAB — BASIC METABOLIC PANEL
Anion gap: 11 (ref 5–15)
BUN: 11 mg/dL (ref 6–20)
CO2: 23 mmol/L (ref 22–32)
Calcium: 9.9 mg/dL (ref 8.9–10.3)
Chloride: 105 mmol/L (ref 98–111)
Creatinine, Ser: 0.69 mg/dL (ref 0.44–1.00)
GFR calc Af Amer: >60 mL/min (ref >60)
GFR calc non Af Amer: >60 mL/min (ref >60)
Glucose, Bld: 125 mg/dL — ABNORMAL HIGH (ref 70–99)
Potassium: 4.2 mmol/L (ref 3.5–5.1)
Sodium: 139 mmol/L (ref 135–145)

## 2019-07-12 LAB — CBC WITH DIFFERENTIAL/PLATELET
Abs Immature Granulocytes: 0.05 10*3/uL (ref 0.00–0.07)
Basophils Absolute: 0 10*3/uL (ref 0.0–0.1)
Basophils Relative: 0 %
Eosinophils Absolute: 0 10*3/uL (ref 0.0–0.5)
Eosinophils Relative: 0 %
HCT: 43.5 % (ref 36.0–46.0)
Hemoglobin: 13.9 g/dL (ref 12.0–15.0)
Immature Granulocytes: 1 %
Lymphocytes Relative: 13 %
Lymphs Abs: 1.2 10*3/uL (ref 0.7–4.0)
MCH: 28.5 pg (ref 26.0–34.0)
MCHC: 32 g/dL (ref 30.0–36.0)
MCV: 89.3 fL (ref 80.0–100.0)
Monocytes Absolute: 0.2 10*3/uL (ref 0.1–1.0)
Monocytes Relative: 2 %
Neutro Abs: 7.8 10*3/uL — ABNORMAL HIGH (ref 1.7–7.7)
Neutrophils Relative %: 84 %
Platelets: 300 10*3/uL (ref 150–400)
RBC: 4.87 MIL/uL (ref 3.87–5.11)
RDW: 13.5 % (ref 11.5–15.5)
WBC: 9.1 10*3/uL (ref 4.0–10.5)
nRBC: 0 % (ref 0.0–0.2)

## 2019-07-12 MED ORDER — EPINEPHRINE 0.3 MG/0.3ML IJ SOAJ: 0.3000 mg | INTRAMUSCULAR | 0 refills | Status: DC | PRN

## 2019-07-12 MED ORDER — SODIUM CHLORIDE 0.9 % IV BOLUS
1000.0000 mL | Freq: Once | INTRAVENOUS | Status: AC
  Administered 2019-07-12: 1000 mL via INTRAVENOUS

## 2019-07-12 MED ORDER — HYDROXYZINE HCL 25 MG PO TABS
25.0000 mg | ORAL_TABLET | Freq: Once | ORAL | Status: AC
  Administered 2019-07-12: 25 mg via ORAL
  Filled 2019-07-12: qty 1

## 2019-07-12 MED ORDER — ALBUTEROL SULFATE HFA 108 (90 BASE) MCG/ACT IN AERS
4.0000 | INHALATION_SPRAY | Freq: Once | RESPIRATORY_TRACT | Status: AC
  Administered 2019-07-12: 4 via RESPIRATORY_TRACT
  Filled 2019-07-12: qty 6.7

## 2019-07-12 NOTE — ED Provider Notes (Signed)
[facility] EMERGENCY DEPARTMENT Provider Note   CSN: [id number] Arrival date & time: 07/12/19  1418     History Chief Complaint  Patient presents with  . Shortness of Breath  . reaction to COVID vaccine    Christy Gay is a 44 y.o. female.  HPI      Christy Gay is a 44 y.o. female, with a history of arthritis, presenting to the ED with shortness of breath beginning this morning.  Accompanied by chest tightness and itching in the extremities. She states she received her first dose of Covid vaccine March 7.  The same day she began to have itching and hives, for which she took Benadryl. Monday March 8 she had some throat itching and lip swelling.  She was seen at her PCPs office, given steroid injection, started on prednisone 40 mg daily, told to take Benadryl, Pepcid, and Zyrtec.  Her last dose of these medications was this morning around 8 AM. Yesterday her symptoms seemed to have resolved. This morning, she woke up with shortness of breath and itching, though without a rash or swelling.  Denies fever/chills, cough, N/V/D, abdominal pain, dizziness, headache, confusion, difficulty swallowing or speaking, or any other complaints.  Past Medical History:  Diagnosis Date  . Arthritis    neck, upper back  . 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 Diabetes mellitus without complication (HCC)    gestational with first pregnancy  . Headache(784.0)    migraines  . Normal pregnancy, repeat 10/11/2013    Patient Active Problem List   Diagnosis Date Noted  . S/P cesarean section 10/12/2013  . Normal pregnancy, repeat 10/11/2013    Past Surgical History:  Procedure Laterality Date  . CALCANEAL OSTEOTOMY Left 10/14/2017   Procedure: Right Calcaneal Osteotomy;  Surgeon: Toni Arthurs, MD;  Location: [facility];  Service: Orthopedics;  Laterality: Left;  . CERVICAL ABLATION    . CESAREAN SECTION    . CESAREAN SECTION N/A  10/12/2013   Procedure: CESAREAN SECTION;  Surgeon: Sherian Rein, MD;  Location: [facility];  Service: Obstetrics;  Laterality: N/A;  . GASTROCNEMIUS RECESSION Right 10/14/2017   Procedure: Right Gastroc Recession;  Surgeon: Toni Arthurs, MD;  Location: [facility];  Service: Orthopedics;  Laterality: Right;  . KIDNEY SURGERY    . LIGAMENT REPAIR Right 10/14/2017   Procedure: Right Spring Ligament Repair;  Surgeon: Toni Arthurs, MD;  Location: [facility];  Service: Orthopedics;  Laterality: Right;  . TENOLYSIS Right 10/14/2017   Procedure: Right Posterior Tibial Tenolysis;  Surgeon: Toni Arthurs, MD;  Location: [facility];  Service: Orthopedics;  Laterality: Right;  . TONSILLECTOMY    . TUBAL LIGATION Bilateral 10/12/2013   Procedure: BILATERAL TUBAL LIGATION;  Surgeon: Sherian Rein, MD;  Location: [facility];  Service: Obstetrics;  Laterality: Bilateral;     OB History    Gravida  2   Para  2   Term  2   Preterm      AB      Living  2     SAB      TAB      Ectopic      Multiple      Live Births  2           No family history on file.  Social History   Tobacco Use  . Smoking status: Never Smoker  . Smokeless tobacco: Never Used  Substance Use Topics  . Alcohol use: No  . Drug use: No    Home Medications Prior to Admission medications   Medication Sig Start Date End Date Taking? Authorizing Provider  acetaminophen (TYLENOL) 500 MG tablet Take 500 mg by mouth every 6 (six) hours as needed for mild pain.   Yes [provider]  diphenhydrAMINE (BENADRYL) 25 MG tablet Take 25 mg by mouth every 6 (six) hours as needed for itching.   Yes [provider]  famotidine (PEPCID) 20 MG tablet Take 40 mg by mouth at bedtime.   Yes [provider]  loratadine (CLARITIN) 10 MG tablet Take 10 mg by mouth daily.   Yes [provider]  predniSONE (DELTASONE) 20 MG tablet 2 po at sametime  daily for 5 days- start tomorrow Patient taking differently: Take 40 mg by mouth daily.  07/10/19  Yes Daphine Deutscher, Mary-Margaret, FNP  venlafaxine XR (EFFEXOR-XR) 37.5 MG 24 hr capsule Take 2 capsules by mouth daily.    Yes [provider]  EPINEPHrine (EPIPEN 2-PAK) 0.3 mg/0.3 mL IJ SOAJ injection Inject 0.3 mLs (0.3 mg total) into the muscle as needed for anaphylaxis. 07/12/19   Chayson Charters C, PA-C    Allergies    Keflex [cephalexin]  Review of Systems   Review of Systems  Constitutional: Negative for chills, diaphoresis and fever.  HENT: Negative for drooling, facial swelling, trouble swallowing and voice change.   Respiratory: Positive for chest tightness and shortness of breath. Negative for cough.   Cardiovascular: Negative for leg swelling.  Gastrointestinal: Negative for abdominal pain, diarrhea, nausea and vomiting.  Skin:       Itching  Neurological: Negative for dizziness, syncope, weakness and headaches.  All other systems reviewed and are negative.   Physical Exam Updated Vital Signs BP 133/81 (BP Location: Right Arm)   Pulse 78   Temp 98.3 F (36.8 C) (Oral)   Resp 17   SpO2 96%   Physical Exam Vitals and nursing note reviewed.  Constitutional:      General: She is not in acute distress.    Appearance: She is well-developed. She is not diaphoretic.  HENT:     Head: Normocephalic and atraumatic.     Mouth/Throat:     Mouth: Mucous membranes are moist.     Pharynx: Oropharynx is clear.     Comments: No perioral or intraoral swelling.  No facial swelling.  No intraoral or facial lesions noted. No trismus or noted abnormal phonation.  Mouth opening to at least 3 finger widths.  Handles oral secretions without difficulty.  No sublingual swelling.  No swelling or tenderness to the submental or submandibular regions.  No swelling or tenderness into the soft tissues of the neck. Eyes:     Conjunctiva/sclera: Conjunctivae normal.  Cardiovascular:     Rate and  Rhythm: Normal rate and regular rhythm.     Pulses: Normal pulses.          Radial pulses are 2+ on the right side and 2+ on the left side.       Posterior tibial pulses are 2+ on the right side and 2+ on the left side.     Heart sounds: Normal heart sounds.     Comments: Tactile temperature in the extremities appropriate and equal bilaterally. Pulmonary:     Effort: Pulmonary effort is normal. No respiratory distress.     Breath sounds: Normal breath sounds.     Comments: No increased work of breathing.  Speaks  in full sentences without difficulty. Abdominal:     Palpations: Abdomen is soft.     Tenderness: There is no abdominal tenderness. There is no guarding.  Musculoskeletal:     Cervical back: Neck supple.     Right lower leg: No edema.     Left lower leg: No edema.  Lymphadenopathy:     Cervical: No cervical adenopathy.  Skin:    General: Skin is warm and dry.     Comments: No erythema, urticaria, or other lesions noted to the extremities or trunk.  Neurological:     Mental Status: She is alert.  Psychiatric:        Mood and Affect: Mood and affect normal.        Speech: Speech normal.        Behavior: Behavior normal.     ED Results / Procedures / Treatments   Labs (all labs ordered are listed, but only abnormal results are displayed) Labs Reviewed  BASIC METABOLIC PANEL - Abnormal; Notable for the following components:      Result Value   Glucose, Bld 125 (*)    All other components within normal limits  CBC WITH DIFFERENTIAL/PLATELET - Abnormal; Notable for the following components:   Neutro Abs 7.8 (*)    All other components within normal limits    EKG EKG Interpretation  Date/Time:  Wednesday July 12 2019 14:42:09 EST Ventricular Rate:  84 PR Interval:  152 QRS Duration: 94 QT Interval:  368 QTC Calculation: 434 R Axis:   56 Text Interpretation: Normal sinus rhythm Normal ECG Confirmed by Carmin Muskrat [phone]) on 07/12/2019 5:18:54  PM   Radiology DG Chest Portable 1 View  Result Date: 07/12/2019 CLINICAL DATA:  Shortness of breath. EXAM: PORTABLE CHEST 1 VIEW COMPARISON:  None. FINDINGS: The cardiomediastinal contours are normal. Minor subsegmental atelectasis at the left lung base. Pulmonary vasculature is normal. No consolidation, pleural effusion, or pneumothorax. No acute osseous abnormalities are seen. IMPRESSION: Minor left basilar atelectasis. Electronically Signed   By: Keith Rake M.D.   On: 07/12/2019 18:08    Procedures Procedures (including critical care time)  Medications Ordered in ED Medications  sodium chloride 0.9 % bolus 1,000 mL (0 mLs Intravenous Stopped 07/12/19 1905)  albuterol (VENTOLIN HFA) 108 (90 Base) MCG/ACT inhaler 4 puff (4 puffs Inhalation Given 07/12/19 1810)  hydrOXYzine (ATARAX/VISTARIL) tablet 25 mg (25 mg Oral Given 07/12/19 1810)    ED Course  I have reviewed the triage vital signs and the nursing notes.  Pertinent labs & imaging results that were available during my care of the patient were reviewed by me and considered in my medical decision making (see chart for details).  Clinical Course as of Jul 11 1937  Wed Jul 12, 2019  1907 Patient states she feels much better.   [SJ]    Clinical Course User Index [SJ] Mariella Blackwelder, Helane Gunther, PA-C   MDM Rules/Calculators/A&P                      Patient presents with shortness of breath and itching. Patient is nontoxic appearing, afebrile, not tachycardic, not tachypneic, not hypotensive, maintains excellent SPO2 on room air, and is in no apparent distress.   I reviewed and interpreted the patient's labs and radiological studies.  Patient does not meet criteria for anaphylaxis and my suspicion for anaphylactic reaction is low. The patient was given instructions for home care as well as return precautions. Patient voices understanding of these instructions,  accepts the plan, and is comfortable with discharge.  Findings and plan of  care discussed with Adalberto Cole, MD. Dr. Jeraldine Loots personally evaluated and examined this patient.  Vitals:   07/12/19 1432 07/12/19 1734 07/12/19 1911  BP: 133/81 133/72 133/79  Pulse: 78 74 77  Resp: 17 20 (!) 22  Temp: 98.3 F (36.8 C)    TempSrc: Oral    SpO2: 96% 96% 98%    Final Clinical Impression(s) / ED Diagnoses Final diagnoses:  Itching  Shortness of breath    Rx / DC Orders ED Discharge Orders         Ordered    EPINEPHrine (EPIPEN 2-PAK) 0.3 mg/0.3 mL IJ SOAJ injection  As needed     07/12/19 1935           Concepcion Living 07/12/19 1940    Gerhard Munch, MD 07/13/19 [phone]

## 2019-07-12 NOTE — ED Triage Notes (Signed)
Pt received COVID vaccine on Sunday and 1 hr afterwards started having SOB, rash, and itching.  States she went to PCP on Monday and given IM injection and started on Prednisone.  Reports increased SOB, generalized itching and slight swelling to lips since this morning.

## 2019-07-12 NOTE — Progress Notes (Signed)
Virtual Visit via Telephone Note  I connected with Christy Gay on 07/12/19 at 1:03 PM by telephone and verified that I am speaking with the correct person using two identifiers. Christy Gay is currently located at home and nobody is currently with her during this visit. The provider, Loman Brooklyn, FNP is located in their home at time of visit.  I discussed the limitations, risks, security and privacy concerns of performing an evaluation and management service by telephone and the availability of in person appointments. I also discussed with the patient that there may be a patient responsible charge related to this service. The patient expressed understanding and agreed to proceed.  Subjective: PCP: [name], DO  Chief Complaint  Patient presents with  . Allergic Reaction   Patient reports she received the [place] COVID-19 vaccine Sunday evening.  About an hour later she started itching all over.  She states she was advised by M. Rakes to take Benadryl, Zyrtec, and Pepcid which she has continued to do since then.  She was seen by Rockne Coons on 07/10/2019 continuing to itch all over with additional swelling of lips and sore throat.  She was given a Depo-Medrol injection 80 mg in the office and prescribed prednisone 40 mg daily x5 days which she waited and started yesterday as directed.  She states she was feeling better Monday evening and yesterday.  Today she woke up with her lips and throat feeling funny, feeling slightly short of breath, more rapid breathing than her usual, chest feeling funny, chills, a feeling of bee stings which then turns to itching, and her skin being red with wrinkles mainly on her forearms.  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   ROS: Per HPI  Current Outpatient Medications:  .  predniSONE (DELTASONE) 20 MG tablet, 2 po at sametime daily for 5 days- start tomorrow, Disp: 10 tablet, Rfl: 0 .  venlafaxine XR  (EFFEXOR-XR) 37.5 MG 24 hr capsule, Take 2 capsules by mouth daily. , Disp: , Rfl:   Allergies  Allergen Reactions  . Keflex [Cephalexin] Rash   Past Medical History:  Diagnosis Date  . Arthritis    neck, upper back  . 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 Diabetes mellitus without complication (HCC)    gestational with first pregnancy  . Headache(784.0)    migraines  . Normal pregnancy, repeat 10/11/2013    Observations/Objective: A&O  No respiratory distress or wheezing audible over the phone Mood, judgement, and thought processes all WNL  Assessment and Plan: 1. Allergic reaction to drug, subsequent encounter - Discussed with patient that since her symptoms are getting worse and she is continuing the Benadryl, Zyrtec, Pepcid, and prednisone I recommend she go to the ER.   Follow Up Instructions:  I discussed the assessment and treatment plan with the patient. The patient was provided an opportunity to ask questions and all were answered. The patient agreed with the plan and demonstrated an understanding of the instructions.   The patient was advised to call back or seek an in-person evaluation if the symptoms worsen or if the condition fails to improve as anticipated.  The above assessment and management plan was discussed with the patient. The patient verbalized understanding of and has agreed to the management plan. Patient is aware to call the clinic if symptoms persist or worsen. Patient is aware when to  return to the clinic for a follow-up visit. Patient educated on when it is appropriate to go to the emergency department.   Time call ended: 1:12 PM  I provided 11 minutes of non-face-to-face time during this encounter.  Deliah Boston, MSN, APRN, FNP-C [facility] 07/12/19

## 2019-07-12 NOTE — Discharge Instructions (Signed)
Allergic Reaction Instructions:  Benadryl: Take 25 mg of Benadryl every 6 hours for the next 24 hours.  Use caution as Benadryl can make you drowsy. Pepcid: Take the Pepcid, as prescribed, over the next 3 days. Prednisone: Continue to take prednisone, as prescribed, until finished. EpiPen: You have been prescribed an EpiPen to be used in the case of anaphylaxis. Please see the attached sheets regarding the symptoms that would cause you to have to use the EpiPen. If you have to use the EpiPen, you should always come to the emergency room immediately.  Follow-up with your primary care provider on this matter.  Allergy testing with an allergist may be warranted.  Return to the ED for worsening symptoms, shortness of breath, chest pain, palpitations, persistent vomiting, facial or throat swelling, or any other major concerns.

## 2019-07-13 ENCOUNTER — Telehealth: Payer: Self-pay | Admitting: Family Medicine

## 2019-07-13 ENCOUNTER — Ambulatory Visit: Payer: BC Managed Care – PPO | Admitting: Family Medicine

## 2019-07-13 MED ORDER — HYDROXYZINE HCL 25 MG PO TABS: 25.0000 mg | ORAL_TABLET | Freq: Three times a day (TID) | ORAL | 0 refills | Status: DC | PRN

## 2019-07-13 NOTE — Telephone Encounter (Signed)
Notes from ER in Epic - can you fill this request ( visit yesterday )

## 2019-07-13 NOTE — Telephone Encounter (Signed)
Pt aware.

## 2019-07-13 NOTE — Telephone Encounter (Signed)
Sent to [facility] in [place].

## 2019-07-30 ENCOUNTER — Ambulatory Visit: Payer: BC Managed Care – PPO

## 2019-08-02 ENCOUNTER — Ambulatory Visit: Payer: BC Managed Care – PPO | Admitting: Allergy & Immunology

## 2019-08-07 ENCOUNTER — Ambulatory Visit (INDEPENDENT_AMBULATORY_CARE_PROVIDER_SITE_OTHER): Payer: BC Managed Care – PPO

## 2019-08-07 ENCOUNTER — Ambulatory Visit (INDEPENDENT_AMBULATORY_CARE_PROVIDER_SITE_OTHER): Payer: BC Managed Care – PPO | Admitting: Family Medicine

## 2019-08-07 ENCOUNTER — Other Ambulatory Visit: Payer: Self-pay

## 2019-08-07 VITALS — BP 127/88 | HR 80 | Temp 98.7°F | Ht 62.0 in | Wt 234.0 lb

## 2019-08-07 DIAGNOSIS — L700 Acne vulgaris: Secondary | ICD-10-CM | POA: Diagnosis not present

## 2019-08-07 DIAGNOSIS — T8069XA Other serum reaction due to other serum, initial encounter: Secondary | ICD-10-CM

## 2019-08-07 DIAGNOSIS — R0609 Other forms of dyspnea: Secondary | ICD-10-CM

## 2019-08-07 DIAGNOSIS — R739 Hyperglycemia, unspecified: Secondary | ICD-10-CM | POA: Diagnosis not present

## 2019-08-07 DIAGNOSIS — R0600 Dyspnea, unspecified: Secondary | ICD-10-CM

## 2019-08-07 LAB — BAYER DCA HB A1C WAIVED: HB A1C (BAYER DCA - WAIVED): 6 % (ref <7.0)

## 2019-08-07 MED ORDER — DAPSONE 5 % EX GEL: CUTANEOUS | 12 refills | Status: DC
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
## 2019-08-08 LAB — D-DIMER, QUANTITATIVE: D-DIMER: 0.26 mg/L FEU (ref 0.00–0.49)

## 2019-08-09 ENCOUNTER — Other Ambulatory Visit: Payer: Self-pay

## 2019-08-09 ENCOUNTER — Encounter: Payer: Self-pay | Admitting: Allergy & Immunology

## 2019-08-09 ENCOUNTER — Ambulatory Visit: Payer: BC Managed Care – PPO | Admitting: Allergy & Immunology

## 2019-08-09 VITALS — BP 102/70 | HR 73 | Temp 98.0°F | Resp 18 | Ht 62.0 in | Wt 236.0 lb

## 2019-08-09 DIAGNOSIS — T50Z95D Adverse effect of other vaccines and biological substances, subsequent encounter: Secondary | ICD-10-CM | POA: Diagnosis not present

## 2019-08-09 DIAGNOSIS — Z7189 Other specified counseling: Secondary | ICD-10-CM | POA: Diagnosis not present

## 2019-08-09 DIAGNOSIS — Z7185 Encounter for immunization safety counseling: Secondary | ICD-10-CM

## 2019-08-09 NOTE — Progress Notes (Signed)
NEW PATIENT  Date of Service/Encounter:  08/09/19  Referring provider: [name], DO   Assessment:   Vaccine reaction to COVID19 vaccine  Vaccine counseling  Shortness of breath - onset after the COVID19 vaccine - most recent CXR normal, D-dimer normal, and spirometry normal  History of environmental allergies in her 20s  History of a large local reaction to stinging insects  Plan/Recommendations:   1. Adverse reaction to the COVID19 vaccine - I think that testing would be good for you. - Come back on Monday morning for the COVID19 component testing. - Lung testing looks good today.  2. Return in about 5 days (around 08/14/2019). This can be an in-person, a virtual Webex or a telephone follow up visit.  Subjective:   Christy Gay is a 44 y.o. female presenting today for evaluation of  Chief Complaint  Patient presents with  . Allergic Reaction    covid vaccine, March 7th  . Angioedema  . Rash    itching    Christy Gay has a history of the following: Patient Active Problem List   Diagnosis Date Noted  . S/P cesarean section 10/12/2013  . Normal pregnancy, repeat 10/11/2013    History obtained from: chart review and patient.  Christy Gay was referred by [name], DO.     Christy Gay is a 44 y.o. female presenting for an evaluation of a vaccine reaction to COVID19.  She denies a rash, but this was rather later in the evening. She got the shot around 6pm on a Sunday. PCP recommended Benadryl and a cetirizine. She woke up the next morning and she had throat pain and lip swelling with tingling. She went to see her PCP and she was given a steroid injection as well as prednisone. The itching improved with the use of the Benadryl. She was using it three times daily and did well on Monday and Tuesday. Wednesday, she woke up and could not "breathe right". She called the PCP and was told thaet if she thought that she needed epinephrine, she should  go to the ED. She went to the ED on March 10th.    She received an inhaler and atarax. An EKG was normal. She was sent home with an inhaler. She was using it a couple of more times that week. 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   She was overall doing alright, but it seemed that with every time she was active she would continue to have the shortness of breath. She was previously very active and was able to maintain her normal activity.   She has five kids at home, ranging from adults hood down to age 71. She was having shortness of breath with exercise. Every time that she has tried to do anything requiring physical activity, she has intense fatigue and shortness of breath. She had a CXR in March early that demonstrated mild atelectasis. A CXR on Monday was better. She had a D-dimer that was normal.   In addition to the intense fatigue, she is having a lot of itching. She reports that she is having an "itching sensation wirh a twinge". She sometimes feels that she is "going crazy".   She never actually had confirmed COVID. She thought she had it in January and she was very tired and worn down. She went to get tested and this was negative. Two days later she still felt bad so she went to  see her PCP and she had a kidney infection. But she was never diagnosed with confirmed COVID19.   She did get the flu vaccine on a number of occasions without a problem. She was given boosters when she was pregnant. She has never had a problem with any vaccinations at all.   She did have allergies as a child that consisted of sneezing. She was on cetirizine and Rhincort when she was younger. But by the time that she was 28, her allergies improved and she had less trouble with her breathing.   She was stung by 20 yellow jackets once and had a large local reaction. She was 23 at the time and was treated with prednisone. She does not carry an EpiPen.   Otherwise, there is no history  of other atopic diseases, including drug allergies, eczema, urticaria or contact dermatitis. There is no significant infectious history. Vaccinations are up to date.    Past Medical History: Patient Active Problem List   Diagnosis Date Noted  . S/P cesarean section 10/12/2013  . Normal pregnancy, repeat 10/11/2013    Medication List:  Allergies as of 08/09/2019      Reactions   Keflex [cephalexin] Rash      Medication List       Accurate as of August 09, 2019 11:59 PM. If you have any questions, ask your nurse or doctor.        STOP taking these medications   acetaminophen 500 MG tablet Commonly known as: TYLENOL Stopped by: Valentina Shaggy, MD   Dapsone 5 % topical gel Stopped by: Valentina Shaggy, MD     TAKE these medications   albuterol 108 (90 Base) MCG/ACT inhaler Commonly known as: VENTOLIN HFA Inhale 1-2 puffs into the lungs every 6 (six) hours as needed for wheezing or shortness of breath.   EPINEPHrine 0.3 mg/0.3 mL Soaj injection Commonly known as: EpiPen 2-Pak Inject 0.3 mLs (0.3 mg total) into the muscle as needed for anaphylaxis.   hydrOXYzine 25 MG tablet Commonly known as: ATARAX/VISTARIL Take 1 tablet (25 mg total) by mouth 3 (three) times daily as needed for itching.   venlafaxine XR 37.5 MG 24 hr capsule Commonly known as: EFFEXOR-XR Take 2 capsules by mouth daily.       Birth History: non-contributory  Developmental History: non-contributory  Past Surgical History: Past Surgical History:  Procedure Laterality Date  . CALCANEAL OSTEOTOMY Left 10/14/2017   Procedure: Right Calcaneal Osteotomy;  Surgeon: Wylene Simmer, MD;  Location: [facility];  Service: Orthopedics;  Laterality: Left;  . CERVICAL ABLATION    . CESAREAN SECTION    . CESAREAN SECTION N/A 10/12/2013   Procedure: CESAREAN SECTION;  Surgeon: Janyth Contes, MD;  Location: [facility];  Service: Obstetrics;  Laterality: N/A;  . GASTROCNEMIUS RECESSION  Right 10/14/2017   Procedure: Right Gastroc Recession;  Surgeon: Wylene Simmer, MD;  Location: [facility];  Service: Orthopedics;  Laterality: Right;  . KIDNEY SURGERY    . LIGAMENT REPAIR Right 10/14/2017   Procedure: Right Spring Ligament Repair;  Surgeon: Wylene Simmer, MD;  Location: [facility];  Service: Orthopedics;  Laterality: Right;  . TENOLYSIS Right 10/14/2017   Procedure: Right Posterior Tibial Tenolysis;  Surgeon: Wylene Simmer, MD;  Location: [facility];  Service: Orthopedics;  Laterality: Right;  . TONSILLECTOMY    . TUBAL LIGATION Bilateral 10/12/2013   Procedure: BILATERAL TUBAL LIGATION;  Surgeon: Janyth Contes, MD;  Location: [facility];  Service:  Obstetrics;  Laterality: Bilateral;     Family History: History reviewed. No pertinent family history.   Social History: Jenette lives at home with her family.  She was in a house that was built in 1868.  There is hardwood at home.  She has gas heating and central cooling.  There is a cat and dog inside of the home.  She does have dust mite covers on her bed, but not her pillows.  There is no tobacco exposure.  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  There is no mold exposure in the school at home.   Review of Systems  Constitutional: Positive for fever. Negative for chills, malaise/fatigue and weight loss.  HENT: Negative.  Negative for congestion, ear discharge, ear pain and sore throat.   Eyes: Negative for pain, discharge and redness.  Respiratory: Positive for shortness of breath. Negative for cough, sputum production and wheezing.   Cardiovascular: Negative.  Negative for chest pain and palpitations.  Gastrointestinal: Negative for abdominal pain, constipation, diarrhea, heartburn, nausea and vomiting.  Skin: Positive for itching and rash.  Neurological: Negative for dizziness and headaches.  Endo/Heme/Allergies: Positive for environmental  allergies. Does not bruise/bleed easily.       Objective:   Blood pressure 102/70, pulse 73, temperature 98 F (36.7 C), temperature source Temporal, resp. rate 18, height 5\' 2"  (1.575 m), weight 236 lb (107 kg), SpO2 97 %. Body mass index is 43.16 kg/m.   Physical Exam:   Physical Exam  Constitutional: She appears well-developed and well-nourished.  Very pleasant female. Interactive.   HENT:  Head: Normocephalic and atraumatic.  Right Ear: Tympanic membrane, external ear and ear canal normal. No drainage, swelling or tenderness. Tympanic membrane is not injected, not scarred, not erythematous, not retracted and not bulging.  Left Ear: Tympanic membrane, external ear and ear canal normal. No drainage, swelling or tenderness. Tympanic membrane is not injected, not scarred, not erythematous, not retracted and not bulging.  Nose: Mucosal edema and rhinorrhea present. No nasal deformity or septal deviation. No epistaxis. Right sinus exhibits no maxillary sinus tenderness and no frontal sinus tenderness. Left sinus exhibits no maxillary sinus tenderness and no frontal sinus tenderness.  Mouth/Throat: Uvula is midline and oropharynx is clear and moist. Mucous membranes are not pale and not dry.  No cobblestoning present in the posterior oropharynx. She does not have any nasal drainage appreciated. Tonsils are normal sized bilaterally. No polyps appreciated.   Eyes: Pupils are equal, round, and reactive to light. Conjunctivae and EOM are normal. Right eye exhibits no chemosis and no discharge. Left eye exhibits no chemosis and no discharge. Right conjunctiva is not injected. Left conjunctiva is not injected.  Cardiovascular: Normal rate, regular rhythm and normal heart sounds.  Respiratory: Effort normal and breath sounds normal. No accessory muscle usage. No tachypnea. No respiratory distress. She has no wheezes. She has no rhonchi. She has no rales. She exhibits no tenderness.  Moving air  well in all lung fields. No increased work of breathing noted.  GI: There is no abdominal tenderness. There is no rebound and no guarding.  Lymphadenopathy:       Head (right side): No submandibular, no tonsillar and no occipital adenopathy present.       Head (left side): No submandibular, no tonsillar and no occipital adenopathy present.    She has no cervical adenopathy.  Neurological: She is alert.  Skin: No abrasion, no petechiae and no rash noted. Rash  is not papular, not vesicular and not urticarial. No erythema. No pallor.  No eczematous or urticarial lesions noted.   Psychiatric: She has a normal mood and affect.     Diagnostic studies:    Spirometry: results normal (FEV1: 3.06/114%, FVC: 3.54/106%, FEV1/FVC: 86%).    Spirometry consistent with normal pattern.  She was given a sample of Breo to use 1 puff once daily through the next visit to see if this helped at all.  Allergy Studies: none       Malachi Bonds, MD [facility]

## 2019-08-09 NOTE — Patient Instructions (Addendum)
1. Adverse reaction to the COVID19 vaccine - I think that testing would be good for you. - Come back on Monday morning for the COVID19 component testing. - Lung testing looks good today.  2. Return in about 5 days (around 08/14/2019). This can be an in-person, a virtual Webex or a telephone follow up visit.   Please inform us of any Emergency Department visits, hospitalizations, or changes in symptoms. Call us before going to the ED for breathing or allergy symptoms since we might be able to fit you in for a sick visit. Feel free to contact us anytime with any questions, problems, or concerns.  It was a pleasure to meet you today!  Websites that have reliable patient information: 1. American Academy of Asthma, Allergy, and Immunology: www.aaaai.org 2. Food Allergy Research and Education (FARE): foodallergy.org 3. Mothers of Asthmatics: http://www.asthmacommunitynetwork.org 4. American College of Allergy, Asthma, and Immunology: www.acaai.org   COVID-19 Vaccine Information can be found at: PodExchange.nl For questions related to vaccine distribution or appointments, please email vaccine@[email or web address] [email or web address] or call [phone].     "Like" [place] on Facebook and Instagram for our latest updates!       HAPPY SPRING!  Make sure you are registered to vote! If you have moved or changed any of your contact information, you will need to get this updated before voting!  In some cases, you MAY be able to register to vote online: [email or web address]

## 2019-08-10 ENCOUNTER — Encounter: Payer: Self-pay | Admitting: Allergy & Immunology

## 2019-08-10 NOTE — Addendum Note (Signed)
Addended by: Alfonse Spruce on: 08/10/2019 09:26 PM   Modules accepted: Level of Service

## 2019-08-13 NOTE — Progress Notes (Signed)
[address] [place] [address] Dept: [phone]  FOLLOW UP NOTE  Patient ID: Christy Gay, female    DOB: Jan 05, 1976  Age: 44 y.o. MRN: [id number] Date of Office Visit: 08/14/2019  Assessment  Chief Complaint: COVID COMPONENT TESTING  HPI Christy Gay is a 44 year old female who presents to the clinic for office COVID vaccine component testing. She was last seen in this clinic on 08/09/2019 by Dr. Dellis Anes for evaluation of asthma, allergic rhinitis, and stinging insect allergy.  At today's visit, she reports she is feeling well over all. Asthma is reported as moderately well controlled with Breo Ellipta 1 puff once a day and infrequent use of her albuterol. She has not taken antihistamines for the last 3 days. Her current medications are listed in the chart.  Drug Allergies:  Allergies  Allergen Reactions  . Keflex [Cephalexin] Rash    Physical Exam: BP 128/86 (BP Location: Left Arm, Patient Position: Sitting, Cuff Size: Normal)   Pulse 68   Temp (!) 97.4 F (36.3 C) (Temporal)   Resp 18   SpO2 95%    Physical Exam Vitals reviewed.  Constitutional:      Appearance: Normal appearance.  HENT:     Head: Normocephalic and atraumatic.     Right Ear: Tympanic membrane normal.     Left Ear: Tympanic membrane normal.     Nose:     Comments: Bilateral nares normal. Pharynx normal. Ears normal. Eyes normal. Eyes:     Conjunctiva/sclera: Conjunctivae normal.  Cardiovascular:     Rate and Rhythm: Normal rate and regular rhythm.     Heart sounds: Normal heart sounds. No murmur.  Pulmonary:     Effort: Pulmonary effort is normal.     Breath sounds: Normal breath sounds.     Comments: Lungs clear to auscultation Musculoskeletal:     Cervical back: Normal range of motion and neck supple.  Skin:    General: Skin is warm and dry.  Neurological:     Mental Status: She is alert and oriented to person, place, and time.  Psychiatric:        Mood and  Affect: Mood normal.        Behavior: Behavior normal.        Thought Content: Thought content normal.        Judgment: Judgment normal.     Diagnostics: FVC 3.09, FEV1 2.72. Predicted FVC 3.35, Predicted FEV1 2.69. Spirometry indicates normal ventilatory function.   Procedure note: Today's skin prick testing: Mirilax (source of PEG 3350) 1:100- negative, 1:10- negative, and 1:1 negative Methylprednisolone acetate  (40 mg/mL)(source of PEG 3350) - negative Triamcinolone acetonide (40 mg/mL) (source of polysorbate-80) - negative   Intradermal testing: Methylprednisolone acetate (1:100) - negative Triamcinolone acetonide (1:100) - negative   Methylprednisolone acetate (1:10) - negative Triamcinolone acetonide (1:10) - negative   Triamcinolone (1:1) - negative   Oral challenge: Miralax 0.3 mL, and 3 mL. The patient was not able to tolerate the Miralax oral challnge at today's visit and developed slight itching after the 0.3 mL dose. She had increased itching after the 3 mL dose. She did not develop hives, cardiopulmonary or gastrointestinal issues with the itch. She was given 2 doses separated by 15 minutes with vital signs and a physical exam between each dose. She was monitored for 60 minutes following the last dose. Vital signs remained stable throughout.   Assessment and Plan: 1. Vaccines and biological substances causing adverse effect  in therapeutic use, subsequent encounter   2. Moderate persistent asthma without complication     Meds ordered this encounter  Medications  . fluticasone furoate-vilanterol (BREO ELLIPTA) 100-25 MCG/INH AEPB    Sig: Inhale 1 puff into the lungs daily.    Dispense:  60 each    Refill:  2    Patient Instructions  Covid vaccine component testing Christy Gay was not able to tolerate the COVID vaccine components testing today If you choose to receive the COVID injection: We suggest the [employer] COVID vaccine which contains  polysorbate (you have been able to tolerate this in an influenza vaccination previously) If possible, have someone else drive you to the injection appointment, Take an antihistamine about 1-2 hours before the injection, Wait in the waiting area for 30 minutes after receiving the injection, Have a set of epinephrine auto-injector devices with you. For mild symptoms you can take an additional over-the-counter antihistamine such as Benadryl and monitor symptoms closely. If symptoms worsen or you have severe symptoms including breathing issues, throat closure, significant swelling, whole body hives, severe diarrhea and vomiting, lightheadedness then inject epinephrine and seek immediate medical care afterward.    Drug allergy  We will add polyethylene glycol to your allergy list. This will be important when you need to  perform bowel prep for colonoscopy.    Call the clinic if this treatment plan is not working well for you   Follow up in 2 months or sooner if needed.    Return in about 2 months (around 10/14/2019), or if symptoms worsen or fail to improve.    Thank you for the opportunity to care for this patient.  Please do not hesitate to contact me with questions.  Gareth Morgan, FNP [facility]

## 2019-08-14 ENCOUNTER — Other Ambulatory Visit: Payer: Self-pay

## 2019-08-14 ENCOUNTER — Ambulatory Visit: Payer: BC Managed Care – PPO | Admitting: Family Medicine

## 2019-08-14 ENCOUNTER — Encounter: Payer: Self-pay | Admitting: Family Medicine

## 2019-08-14 VITALS — BP 128/86 | HR 68 | Temp 97.4°F | Resp 18

## 2019-08-14 DIAGNOSIS — J4540 Moderate persistent asthma, uncomplicated: Secondary | ICD-10-CM

## 2019-08-14 DIAGNOSIS — T50Z95D Adverse effect of other vaccines and biological substances, subsequent encounter: Secondary | ICD-10-CM | POA: Diagnosis not present

## 2019-08-14 NOTE — Patient Instructions (Addendum)
Covid vaccine component testing Nyeema Want was not able to tolerate the COVID vaccine components testing today If you choose to receive the COVID injection: We suggest the [employer] COVID vaccine which contains polysorbate (you have been able to tolerate this in an influenza vaccination previously) If possible, have someone else drive you to the injection appointment, Take an antihistamine about 1-2 hours before the injection, Wait in the waiting area for 30 minutes after receiving the injection, Have a set of epinephrine auto-injector devices with you. For mild symptoms you can take an additional over-the-counter antihistamine such as Benadryl and monitor symptoms closely. If symptoms worsen or you have severe symptoms including breathing issues, throat closure, significant swelling, whole body hives, severe diarrhea and vomiting, lightheadedness then inject epinephrine and seek immediate medical care afterward.    Drug allergy  We will add polyethylene glycol to your allergy list. This will be important when you need to  perform bowel prep for colonoscopy.    Call the clinic if this treatment plan is not working well for you   Follow up in 2 months or sooner if needed.

## 2019-08-15 ENCOUNTER — Telehealth: Payer: Self-pay

## 2019-08-15 ENCOUNTER — Encounter: Payer: Self-pay | Admitting: Family Medicine

## 2019-08-15 MED ORDER — BREO ELLIPTA 100-25 MCG/INH IN AEPB: 1.0000 | INHALATION_SPRAY | Freq: Every day | RESPIRATORY_TRACT | 2 refills | Status: DC

## 2019-08-15 NOTE — Telephone Encounter (Signed)
-----   Message from Hetty Blend, FNP sent at 08/15/2019  1:57 PM EDT ----- Can you please call this patient and check on her itching. Thank you

## 2019-08-15 NOTE — Telephone Encounter (Signed)
Attempted to call patient to see if itching that she experienced during component testing yesterday had subsided. No answer. Left message.

## 2019-08-16 ENCOUNTER — Other Ambulatory Visit: Payer: Self-pay | Admitting: Family Medicine

## 2019-08-16 MED ORDER — ALBUTEROL SULFATE HFA 108 (90 BASE) MCG/ACT IN AERS: 1.0000 | INHALATION_SPRAY | Freq: Four times a day (QID) | RESPIRATORY_TRACT | 2 refills | Status: DC | PRN

## 2019-08-16 NOTE — Telephone Encounter (Signed)
Patient returning call. Patient states she is fine, she had a little itching yesterday but she took an Atarax before bed and has been taking the prednisone that was given to her.

## 2019-08-16 NOTE — Telephone Encounter (Signed)
  Prescription Request  08/16/2019  What is the name of the medication or equipment? Albuterol 108 (90) Base MCG/ACT was given @ [place] and she forgot to tell Gott[place] at her appt on 4-7  Have you contacted your pharmacy to request a refill? (if applicable) NO  Which pharmacy would you like this sent to? [facility] in [place]   Patient notified that their request is being sent to the clinical staff for review and that they should receive a response within 2 business days.

## 2019-08-16 NOTE — Telephone Encounter (Signed)
Attempted to call and left a voicemail and asked patient to call back to discuss.

## 2019-08-16 NOTE — Telephone Encounter (Signed)
Refill sent and patient aware.  

## 2019-08-16 NOTE — Telephone Encounter (Signed)
Thank you :)

## 2019-08-16 NOTE — Telephone Encounter (Signed)
Ok to renew?  

## 2019-08-16 NOTE — Telephone Encounter (Signed)
FYI

## 2019-11-08 ENCOUNTER — Other Ambulatory Visit: Payer: Self-pay

## 2019-11-08 ENCOUNTER — Ambulatory Visit (INDEPENDENT_AMBULATORY_CARE_PROVIDER_SITE_OTHER): Payer: BC Managed Care – PPO | Admitting: Physician Assistant

## 2019-11-08 ENCOUNTER — Encounter: Payer: Self-pay | Admitting: Physician Assistant

## 2019-11-08 VITALS — BP 112/78 | HR 77 | Temp 98.2°F | Resp 20 | Ht 62.0 in | Wt 236.0 lb

## 2019-11-08 DIAGNOSIS — R109 Unspecified abdominal pain: Secondary | ICD-10-CM | POA: Diagnosis not present

## 2019-11-08 LAB — URINALYSIS, ROUTINE W REFLEX MICROSCOPIC
Bilirubin, UA: NEGATIVE
Glucose, UA: NEGATIVE
Leukocytes,UA: NEGATIVE
Nitrite, UA: NEGATIVE
Protein,UA: NEGATIVE
RBC, UA: NEGATIVE
Specific Gravity, UA: >1.03 — ABNORMAL HIGH (ref 1.005–1.030)
Urobilinogen, Ur: 0.2 mg/dL (ref 0.2–1.0)
pH, UA: 5.5 (ref 5.0–7.5)

## 2019-11-08 LAB — MICROSCOPIC EXAMINATION
Epithelial Cells (non renal): >10 /hpf — AB (ref 0–10)
RBC, Urine: NONE SEEN /hpf (ref 0–2)
Renal Epithel, UA: NONE SEEN /hpf

## 2019-11-08 MED ORDER — NITROFURANTOIN MONOHYD MACRO 100 MG PO CAPS: 100.0000 mg | ORAL_CAPSULE | Freq: Two times a day (BID) | ORAL | 0 refills | Status: DC

## 2019-11-08 NOTE — Progress Notes (Signed)
  Subjective:     Patient ID: Christy Gay, female   DOB: 1976-04-16, 44 y.o.   MRN: [id number]  HPI Pt seen today with 2 main complaints #1-intermit 1 month hx of L flank pain States sx will happen 1-2x/day 3-4 times a week Sx will last several minutes and spont resolve Pain so severe pt has to stop current activity Activities do not seem to bring sx on No radicular hip or leg sx Denies any urinary sx + Hx of L renal surgery as a child #2 - Pt with documented allergic rxn to COVID vaccine Ever since vaccination pt with intermit episodes of SOB She was referred to allergist who confirmed allergy and also did office PFT States results were normal but was started on Breo Sx did sem to improve but she stopped med Review of Systems  Constitutional: Negative.   Respiratory: Positive for shortness of breath. Negative for apnea, cough, chest tightness and wheezing.   Cardiovascular: Negative.   Genitourinary: Positive for flank pain. Negative for decreased urine volume, difficulty urinating, dysuria, frequency, hematuria, pelvic pain and urgency.  Musculoskeletal: Positive for back pain and myalgias. Negative for arthralgias and gait problem.       Objective:   Physical Exam Vitals and nursing note reviewed.  Constitutional:      General: She is not in acute distress.    Appearance: Normal appearance. She is not ill-appearing.  Neurological:     Mental Status: She is alert.   Well healed surgical scar L CVA Sl TTP of same FROM of the L-spine - sl sx with rotation SLR neg Muscle strength /sensory good to lower ext UA - see labs     Assessment:     1. Left flank pain        Plan:     Will inform of lab results Would like for her to f/u with allergist since they have done PFT Pt to confirm if results neg or possible asthma For the L flank pain discussed further imaging given hx and sx Will try Macrobid bid x 5 days and await results of culture If sx continue discussed  possible CT scan F/U sooner if any problems

## 2019-11-08 NOTE — Patient Instructions (Addendum)
Shortness of Breath, Adult Shortness of breath means you have trouble breathing. Shortness of breath could be a sign of a medical problem. Follow these instructions at home:   Watch for any changes in your symptoms.  Do not use any products that contain nicotine or tobacco, such as cigarettes, e-cigarettes, and chewing tobacco.  Do not smoke. Smoking can cause shortness of breath. If you need help to quit smoking, ask your doctor.  Avoid things that can make it harder to breathe, such as: ? Mold. ? Dust. ? Air pollution. ? Chemical smells. ? Things that can cause allergy symptoms (allergens), if you have allergies.  Keep your living space clean. Use products that help remove mold and dust.  Rest as needed. Slowly return to your normal activities.  Take over-the-counter and prescription medicines only as told by your doctor. This includes oxygen therapy and inhaled medicines.  Keep all follow-up visits as told by your doctor. This is important. Contact a doctor if:  Your condition does not get better as soon as expected.  You have a hard time doing your normal activities, even after you rest.  You have new symptoms. Get help right away if:  Your shortness of breath gets worse.  You have trouble breathing when you are resting.  You feel light-headed or you pass out (faint).  You have a cough that is not helped by medicines.  You cough up blood.  You have pain with breathing.  You have pain in your chest, arms, shoulders, or belly (abdomen).  You have a fever.  You cannot walk up stairs.  You cannot exercise the way you normally do. These symptoms may represent a serious problem that is an emergency. Do not wait to see if the symptoms will go away. Get medical help right away. Call your local emergency services (911 in the [place] Do not drive yourself to the hospital. Summary  Shortness of breath is when you have trouble breathing enough air. It can be a sign of a  medical problem.  Avoid things that make it hard for you to breathe, such as smoking, pollution, mold, and dust.  Watch for any changes in your symptoms. Contact your doctor if you do not get better or you get worse. This information is not intended to replace advice given to you by your health care provider. Make sure you discuss any questions you have with your health care provider. Document Revised: 09/20/2017 Document Reviewed: 09/20/2017 Elsevier Patient Education  2020 Elsevier Inc.  Flank Pain, Adult Flank pain is pain in your side. The flank is the area of your side between your upper belly (abdomen) and your back. The pain may occur over a short time (acute), or it may be long-term or come back often (chronic). It may be mild or very bad. Pain in this area can be caused by many different things. Follow these instructions at home:   Drink enough fluid to keep your pee (urine) clear or pale yellow.  Rest as told by your doctor.  Take over-the-counter and prescription medicines only as told by your doctor.  Keep a journal to keep track of: ? What has caused your flank pain. ? What has made it feel better.  Keep all follow-up visits as told by your doctor. This is important. Contact a doctor if:  Medicine does not help your pain.  You have new symptoms.  Your pain gets worse.  You have a fever.  Your symptoms last longer than 2-3  days.  You have trouble peeing.  You are peeing more often than normal. Get help right away if:  You have trouble breathing.  You are short of breath.  Your belly hurts, or it is swollen or red.  You feel sick to your stomach (nauseous).  You throw up (vomit).  You feel like you will pass out, or you do pass out (faint).  You have blood in your pee. Summary  Flank pain is pain in your side. The flank is the area of your side between your upper belly (abdomen) and your back.  Flank pain may occur over a short time (acute), or  it may be long-term or come back often (chronic). It may be mild or very bad.  Pain in this area can be caused by many different things.  Contact your doctor if your symptoms get worse or they last longer than 2-3 days. This information is not intended to replace advice given to you by your health care provider. Make sure you discuss any questions you have with your health care provider. Document Revised: 04/02/2017 Document Reviewed: 08/10/2016 Elsevier Patient Education  2020 [employer].

## 2019-11-09 ENCOUNTER — Other Ambulatory Visit: Payer: Self-pay | Admitting: Family Medicine

## 2019-11-10 LAB — URINE CULTURE

## 2019-11-22 ENCOUNTER — Telehealth: Payer: Self-pay

## 2019-11-22 NOTE — Telephone Encounter (Signed)
Patient called stating that she's still feeling short of breath since her visit back in April. Patient did have the component test done due to a reaction with the 1st vaccine. Patient states she did not get the 2nd one yet and she would like to know if she should come in for a visit or be referred to a pulmonologist. Patient is using both of her inhalers. Please advise.

## 2019-11-23 NOTE — Telephone Encounter (Signed)
Left detailed message requesting a call back after 830 am to set up a follow up appointment.

## 2019-11-23 NOTE — Telephone Encounter (Signed)
Let's see her in the office again. Does Christy Gay have any openings in the next week or so?   Malachi Bonds, MD [facility]

## 2019-11-23 NOTE — Telephone Encounter (Signed)
Patient is following back up.  Please Advise.

## 2019-11-24 NOTE — Telephone Encounter (Signed)
Thank you :)

## 2019-11-24 NOTE — Telephone Encounter (Signed)
Patient called back and I made her a follow up appointment for 12/07/19 at 10:45.

## 2019-12-01 ENCOUNTER — Other Ambulatory Visit: Payer: Self-pay | Admitting: Family Medicine

## 2019-12-08 ENCOUNTER — Ambulatory Visit: Payer: BC Managed Care – PPO | Admitting: Allergy & Immunology

## 2019-12-08 ENCOUNTER — Encounter: Payer: Self-pay | Admitting: Allergy & Immunology

## 2019-12-08 ENCOUNTER — Other Ambulatory Visit: Payer: Self-pay

## 2019-12-08 ENCOUNTER — Telehealth: Payer: Self-pay

## 2019-12-08 VITALS — BP 130/80 | HR 67 | Temp 98.1°F | Resp 16 | Wt 237.0 lb

## 2019-12-08 DIAGNOSIS — J302 Other seasonal allergic rhinitis: Secondary | ICD-10-CM

## 2019-12-08 DIAGNOSIS — J4540 Moderate persistent asthma, uncomplicated: Secondary | ICD-10-CM | POA: Diagnosis not present

## 2019-12-08 NOTE — Progress Notes (Signed)
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 Moderate persistent asthma without complication - Lung testing still looked very good.  - Since you were doing better on the [facility], I think we need to restart this daily medication (use this up and then switch to AirDuo since this is cheaper). - We are going to send in AirDuo instead since there is a copay card available.  - Sample provided (you can even download an app to monitor how well you are inhaling the medication).  - We are going to send you Cardiology to rule out cardiac issues, although I think this is unlikely.  - Daily controller medication(s): AirDuo 232/62mcg one puff twice daily.  - Prior to physical activity: albuterol 2 puffs 10-15 minutes before physical activity. - Rescue medications: albuterol 4 puffs every 4-6 hours as needed - Asthma control goals:  * Full participation in all desired activities (may need albuterol before activity) * Albuterol use two time or less a week on average (not counting use with activity) * Cough interfering with sleep two time or less a month * Oral steroids no more than once a year * No hospitalizations  2. Return in about 2 months (around 02/07/2020).   Subjective:   Christy Gay is a 44 y.o. female presenting today for follow up of  Chief Complaint  Patient presents with  . Shortness of Breath    KWANZA CANCELLIERE has a history of the following: Patient Active Problem List   Diagnosis Date Noted  . S/P cesarean section 10/12/2013  . Normal pregnancy, repeat 10/11/2013    History obtained from: chart review and patient.  Christy Gay is a 44 y.o. female presenting for a follow up visit.  She was last seen in April 2021 for Covid vaccine component testing.  At  that time, she had negative testing to the entire panel.  However, she was not able to tolerate the MiraLAX oral challenge.  She developed itching after the 0.3 mL oral dose and more itching after the 3 mL dose.  We did not continue with the remainder of the challenge.  She did not require any epinephrine and did not develop hives, cardiopulmonary or GI issues.  Her vital signs remained stable.  We recommended that she get the [employer] vaccine instead since she had tolerated multiple polysorbate-containing products in the past.  In the interim, she reports that she has continued to feel short of breath.  This had continued since April 2021.  Her shortness of breath started after her COVID-19 vaccine.  She went to the ED and had a normal chest x-ray as well as a normal D-dimer.  Her chest x-ray did show some mild atelectasis.    Since the last visit, she has continued to have problems with SOB. She was on the [facility] one puff once daily. She did feel that this was helping. But then the SOB continued to be a problem. It did seem to get worse without the [facility] on board. She is very confused about what was going on.  She does have a history of anxiety. She has premenstrual dysphoric disorder. There is a large family history of heart conditions as well. She has been using her rescue inhaler with improvement in the shot.  She never did get the [place] [facility] [place] vaccine but when she was going to get it, the blood clotting side effect came to light. So she never got it.   Otherwise, there have been no changes to her past medical history, surgical history, family history, or social history.    Review of Systems  Constitutional: Negative.  Negative for chills, fever, malaise/fatigue and weight loss.  HENT: Negative for congestion, ear discharge, ear pain, sinus pain and sore throat.   Eyes: Negative for pain, discharge and redness.  Respiratory: Positive for shortness of breath. Negative for cough,  sputum production and wheezing.   Cardiovascular: Negative.  Negative for chest pain and palpitations.  Gastrointestinal: Negative for abdominal pain, constipation, diarrhea, heartburn, nausea and vomiting.  Skin: Negative.  Negative for itching and rash.  Neurological: Negative for dizziness and headaches.  Endo/Heme/Allergies: Positive for environmental allergies. Does not bruise/bleed easily.       Objective:   Blood pressure 130/80, pulse 67, temperature 98.1 F (36.7 C), temperature source Temporal, resp. rate 16, weight 237 lb (107.5 kg), SpO2 98 %. Body mass index is 43.35 kg/m.   Physical Exam:  Physical Exam Constitutional:      Appearance: She is well-developed.  HENT:     Head: Normocephalic and atraumatic.     Right Ear: Tympanic membrane, ear canal and external ear normal.     Left Ear: Tympanic membrane and ear canal normal.     Nose: No nasal deformity, septal deviation, mucosal edema or rhinorrhea.     Right Sinus: No maxillary sinus tenderness or frontal sinus tenderness.     Left Sinus: No maxillary sinus tenderness or frontal sinus tenderness.     Mouth/Throat:     Mouth: Mucous membranes are not pale and not dry.     Pharynx: Uvula midline.  Eyes:     General:        Right eye: No discharge.        Left eye: No discharge.     Conjunctiva/sclera: Conjunctivae normal.     Right eye: Right conjunctiva is not injected. No chemosis.    Left eye: Left conjunctiva is not injected. No chemosis.    Pupils: Pupils are equal, round, and reactive to light.  Cardiovascular:     Rate and Rhythm: Normal rate and regular rhythm.     Heart sounds: Normal heart sounds.  Pulmonary:     Effort: Pulmonary effort is normal. No tachypnea, accessory muscle usage or respiratory distress.     Breath sounds: Normal breath sounds. No wheezing, rhonchi or rales.     Comments: Breathing comfortably. I do not appreciate any wheezing whatsoever. There are no wheezes at all  appreciated. She is not tachypneic.  Chest:     Chest wall: No tenderness.  Lymphadenopathy:     Cervical: No cervical adenopathy.  Skin:    Coloration: Skin is not pale.     Findings: No abrasion, erythema, petechiae or rash. Rash is not papular, urticarial or vesicular.  Neurological:     Mental Status: She is alert.      Diagnostic studies:    Spirometry: results abnormal (FEV1: 1.32/43%, FVC: 1.50/39%, FEV1/FVC: 88%).    Spirometry consistent with possible restrictive disease. Xopenex 8 puffs via MDI treatment given in clinic with significant improvement in FEV1 and FVC per ATS criteria. It did not normalize, however  Allergy Studies: none     Malachi Bonds, MD  [facility]

## 2019-12-08 NOTE — Telephone Encounter (Signed)
Cardiology referral for shortness of breath.

## 2019-12-08 NOTE — Patient Instructions (Addendum)
1. Moderate persistent asthma without complication - Lung testing still looked very good.  - Since you were doing better on the Breo, I think we need to restart this daily medication. - We are going to send in AirDuo instead since there is a copay card available.  - Sample provided (you can even download an app to monitor how well you are inhaling the medication).  - We are going to send you Cardiology to rule out cardiac issues, although I think this is unlikely.  - Daily controller medication(s): AirDuo 232/76mcg one puff twice daily.  - Prior to physical activity: albuterol 2 puffs 10-15 minutes before physical activity. - Rescue medications: albuterol 4 puffs every 4-6 hours as needed - Asthma control goals:  * Full participation in all desired activities (may need albuterol before activity) * Albuterol use two time or less a week on average (not counting use with activity) * Cough interfering with sleep two time or less a month * Oral steroids no more than once a year * No hospitalizations  2. Return in about 2 months (around 02/07/2020).    Please inform us of any Emergency Department visits, hospitalizations, or changes in symptoms. Call us before going to the ED for breathing or allergy symptoms since we might be able to fit you in for a sick visit. Feel free to contact us anytime with any questions, problems, or concerns.  It was a pleasure to see you again today!  Websites that have reliable patient information: 1. American Academy of Asthma, Allergy, and Immunology: www.aaaai.org 2. Food Allergy Research and Education (FARE): foodallergy.org 3. Mothers of Asthmatics: http://www.asthmacommunitynetwork.org 4. American College of Allergy, Asthma, and Immunology: www.acaai.org   COVID-19 Vaccine Information can be found at: PodExchange.nl For questions related to vaccine distribution or appointments, please email  [email or web address]@[email or web address] or call [phone].     "Like" [place] on Facebook and Instagram for our latest updates!        Make sure you are registered to vote! If you have moved or changed any of your contact information, you will need to get this updated before voting!  In some cases, you MAY be able to register to vote online: [email or web address]

## 2019-12-10 ENCOUNTER — Encounter: Payer: Self-pay | Admitting: Allergy & Immunology

## 2019-12-10 MED ORDER — AIRDUO DIGIHALER 232-14 MCG/ACT IN AEPB: 1.0000 | INHALATION_SPRAY | Freq: Two times a day (BID) | RESPIRATORY_TRACT | 5 refills | Status: DC

## 2019-12-12 NOTE — Telephone Encounter (Signed)
Noted. Thank you, Dee!   Janelie Goltz, MD [facility]  

## 2019-12-12 NOTE — Telephone Encounter (Signed)
Referral has been placed. I left a detailed message for the patient.  [facility] at [facility] [address] [place],  [place]  [address] Main: [phone]  I will follow back up in a few days to make sure the patient was scheduled.  Thanks

## 2019-12-15 NOTE — Addendum Note (Signed)
Addended by: Osa Craver on: 12/15/2019 08:44 AM   Modules accepted: Orders

## 2019-12-18 ENCOUNTER — Encounter (HOSPITAL_COMMUNITY): Payer: Self-pay | Admitting: Emergency Medicine

## 2019-12-18 ENCOUNTER — Emergency Department (HOSPITAL_COMMUNITY): Payer: BC Managed Care – PPO

## 2019-12-18 ENCOUNTER — Other Ambulatory Visit: Payer: Self-pay

## 2019-12-18 ENCOUNTER — Emergency Department (HOSPITAL_COMMUNITY)
Admission: EM | Admit: 2019-12-18 | Discharge: 2019-12-19 | Disposition: A | Discharge status: Home / Self Care | Payer: BC Managed Care – PPO | Attending: Emergency Medicine | Admitting: Emergency Medicine

## 2019-12-18 DIAGNOSIS — Z5321 Procedure and treatment not carried out due to patient leaving prior to being seen by health care provider: Secondary | ICD-10-CM | POA: Diagnosis not present

## 2019-12-18 DIAGNOSIS — R0789 Other chest pain: Secondary | ICD-10-CM | POA: Diagnosis not present

## 2019-12-18 DIAGNOSIS — R0602 Shortness of breath: Secondary | ICD-10-CM | POA: Diagnosis not present

## 2019-12-18 LAB — I-STAT BETA HCG BLOOD, ED (MC, WL, AP ONLY): I-stat hCG, quantitative: <5 m[IU]/mL (ref <5)

## 2019-12-18 NOTE — ED Triage Notes (Signed)
Pt c/o left upper chest pain that comes and goes since yesterday.  Also st's she feels short of breath at times

## 2019-12-19 LAB — CBC
HCT: 40.5 % (ref 36.0–46.0)
Hemoglobin: 12.9 g/dL (ref 12.0–15.0)
MCH: 29.1 pg (ref 26.0–34.0)
MCHC: 31.9 g/dL (ref 30.0–36.0)
MCV: 91.4 fL (ref 80.0–100.0)
Platelets: 312 10*3/uL (ref 150–400)
RBC: 4.43 MIL/uL (ref 3.87–5.11)
RDW: 13.2 % (ref 11.5–15.5)
WBC: 8.8 10*3/uL (ref 4.0–10.5)
nRBC: 0 % (ref 0.0–0.2)

## 2019-12-19 LAB — BASIC METABOLIC PANEL
Anion gap: 11 (ref 5–15)
BUN: 9 mg/dL (ref 6–20)
CO2: 24 mmol/L (ref 22–32)
Calcium: 9.1 mg/dL (ref 8.9–10.3)
Chloride: 104 mmol/L (ref 98–111)
Creatinine, Ser: 0.71 mg/dL (ref 0.44–1.00)
GFR calc Af Amer: >60 mL/min (ref >60)
GFR calc non Af Amer: >60 mL/min (ref >60)
Glucose, Bld: 119 mg/dL — ABNORMAL HIGH (ref 70–99)
Potassium: 3.7 mmol/L (ref 3.5–5.1)
Sodium: 139 mmol/L (ref 135–145)

## 2019-12-19 LAB — TROPONIN I (HIGH SENSITIVITY): Troponin I (High Sensitivity): 3 ng/L (ref <18)

## 2019-12-19 NOTE — ED Notes (Signed)
Pt stated she would follow up with her pcp in the morning.

## 2019-12-20 ENCOUNTER — Ambulatory Visit: Payer: BC Managed Care – PPO | Admitting: Family Medicine

## 2019-12-21 ENCOUNTER — Telehealth: Payer: Self-pay

## 2019-12-21 NOTE — Telephone Encounter (Signed)
Patient went to the urgent care on Tuesday 12/18/2019 due to her still having some breathing issues. Patient was told she doesn't have heart problems, they diagnosed her with anxiety. Patient is currently seeing a psychiatrist and on a medication regimen. Patient is also schedule to see the cardiologist on 01/31/2020. She is wondering should she keep this appointment?  Thanks

## 2019-12-21 NOTE — Telephone Encounter (Signed)
I would prefer that she sees Cardiology. She did not strike me as anxious, soit would make me fele better for her to see Cardiology.   Malachi Bonds, MD [facility]

## 2019-12-22 NOTE — Telephone Encounter (Signed)
Patient informed. Thanks!

## 2019-12-22 NOTE — Addendum Note (Signed)
Addended by: Robet Leu A on: 12/22/2019 09:05 AM   Modules accepted: Orders

## 2020-01-11 ENCOUNTER — Ambulatory Visit: Payer: BC Managed Care – PPO | Admitting: Nurse Practitioner

## 2020-01-14 ENCOUNTER — Encounter (HOSPITAL_COMMUNITY): Payer: Self-pay | Admitting: Emergency Medicine

## 2020-01-14 ENCOUNTER — Emergency Department (HOSPITAL_COMMUNITY): Payer: BC Managed Care – PPO

## 2020-01-14 ENCOUNTER — Emergency Department (HOSPITAL_COMMUNITY)
Admission: EM | Admit: 2020-01-14 | Discharge: 2020-01-14 | Disposition: A | Discharge status: Home / Self Care | Payer: BC Managed Care – PPO | Attending: Emergency Medicine | Admitting: Emergency Medicine

## 2020-01-14 ENCOUNTER — Other Ambulatory Visit: Payer: Self-pay

## 2020-01-14 DIAGNOSIS — Z79899 Other long term (current) drug therapy: Secondary | ICD-10-CM | POA: Diagnosis not present

## 2020-01-14 DIAGNOSIS — R079 Chest pain, unspecified: Secondary | ICD-10-CM | POA: Diagnosis present

## 2020-01-14 DIAGNOSIS — N189 Chronic kidney disease, unspecified: Secondary | ICD-10-CM | POA: Insufficient documentation

## 2020-01-14 DIAGNOSIS — E1122 Type 2 diabetes mellitus with diabetic chronic kidney disease: Secondary | ICD-10-CM | POA: Diagnosis not present

## 2020-01-14 DIAGNOSIS — Z20822 Contact with and (suspected) exposure to covid-19: Secondary | ICD-10-CM | POA: Diagnosis not present

## 2020-01-14 LAB — CBC
HCT: 45.1 % (ref 36.0–46.0)
Hemoglobin: 14.3 g/dL (ref 12.0–15.0)
MCH: 29.1 pg (ref 26.0–34.0)
MCHC: 31.7 g/dL (ref 30.0–36.0)
MCV: 91.9 fL (ref 80.0–100.0)
Platelets: 333 10*3/uL (ref 150–400)
RBC: 4.91 MIL/uL (ref 3.87–5.11)
RDW: 13.1 % (ref 11.5–15.5)
WBC: 6.9 10*3/uL (ref 4.0–10.5)
nRBC: 0 % (ref 0.0–0.2)

## 2020-01-14 LAB — TROPONIN I (HIGH SENSITIVITY)
Troponin I (High Sensitivity): <2 ng/L (ref <18)
Troponin I (High Sensitivity): <2 ng/L (ref <18)

## 2020-01-14 LAB — BASIC METABOLIC PANEL
Anion gap: 11 (ref 5–15)
BUN: 9 mg/dL (ref 6–20)
CO2: 27 mmol/L (ref 22–32)
Calcium: 9.6 mg/dL (ref 8.9–10.3)
Chloride: 103 mmol/L (ref 98–111)
Creatinine, Ser: 0.64 mg/dL (ref 0.44–1.00)
GFR calc Af Amer: >60 mL/min (ref >60)
GFR calc non Af Amer: >60 mL/min (ref >60)
Glucose, Bld: 92 mg/dL (ref 70–99)
Potassium: 3.8 mmol/L (ref 3.5–5.1)
Sodium: 141 mmol/L (ref 135–145)

## 2020-01-14 LAB — D-DIMER, QUANTITATIVE: D-Dimer, Quant: <0.27 ug/mL-FEU (ref 0.00–0.50)

## 2020-01-14 LAB — SARS CORONAVIRUS 2 BY RT PCR (HOSPITAL ORDER, PERFORMED IN ~~LOC~~ HOSPITAL LAB): SARS Coronavirus 2: NEGATIVE

## 2020-01-14 NOTE — Discharge Instructions (Addendum)
Take over the counter medications as needed for pain.   Follow up with your doctor and the cardiologist as planned.  Return as needed for worsening symptoms.

## 2020-01-14 NOTE — ED Notes (Signed)
EDP aware pt was upset when she left.  Pt self cursing going down the hallway by the EDP's office.

## 2020-01-14 NOTE — ED Notes (Signed)
Pt in hallway, not able to monitor on cardiac monitor.

## 2020-01-14 NOTE — ED Provider Notes (Addendum)
[facility] EMERGENCY DEPARTMENT Provider Note   CSN: [id number] Arrival date & time: 01/14/20  1048    History Chief Complaint  Patient presents with  . Chest Pain    Christy Gay is a 44 y.o. female.  HPI  HPI: A 44 year old patient with a history of obesity presents for evaluation of chest pain. Initial onset of pain was more than 6 hours ago. The patient's chest pain is described as heaviness/pressure/tightness and is worse with exertion. The patient's chest pain is middle- or left-sided, is not well-localized, is not sharp and does radiate to the arms/jaw/neck. The patient does not complain of nausea and denies diaphoresis. The patient has no history of stroke, has no history of peripheral artery disease, has not smoked in the past 90 days, denies any history of treated diabetes, has no relevant family history of coronary artery disease (first degree relative at less than age 73), is not hypertensive and has no history of hypercholesterolemia.  Patient has been having intermittent chest pain for the last month.  Patient states she will get short of breath and have pain in the middle left side of her chest with activity.  The episodes are coming and going almost daily.  She notices symptoms starting after she had the Covid vaccine.  Patient was previously seen in urgent care.  She was diagnosed with anxiety and started on medications but that has not helped.  She does have a follow-up appointment with a cardiologist. Past Medical History:  Diagnosis Date  . Arthritis    neck, upper back  . 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 Diabetes mellitus without complication (HCC)    gestational with first pregnancy  . Headache(784.0)    migraines  . Normal pregnancy, repeat 10/11/2013    Patient Active Problem List   Diagnosis Date Noted  . S/P cesarean section 10/12/2013  . Normal pregnancy, repeat 10/11/2013    Past Surgical History:  Procedure Laterality  Date  . CALCANEAL OSTEOTOMY Left 10/14/2017   Procedure: Right Calcaneal Osteotomy;  Surgeon: Toni Arthurs, MD;  Location: [facility];  Service: Orthopedics;  Laterality: Left;  . CERVICAL ABLATION    . CESAREAN SECTION    . CESAREAN SECTION N/A 10/12/2013   Procedure: CESAREAN SECTION;  Surgeon: Sherian Rein, MD;  Location: [facility];  Service: Obstetrics;  Laterality: N/A;  . GASTROCNEMIUS RECESSION Right 10/14/2017   Procedure: Right Gastroc Recession;  Surgeon: Toni Arthurs, MD;  Location: [facility];  Service: Orthopedics;  Laterality: Right;  . KIDNEY SURGERY    . LIGAMENT REPAIR Right 10/14/2017   Procedure: Right Spring Ligament Repair;  Surgeon: Toni Arthurs, MD;  Location: [facility];  Service: Orthopedics;  Laterality: Right;  . TENOLYSIS Right 10/14/2017   Procedure: Right Posterior Tibial Tenolysis;  Surgeon: Toni Arthurs, MD;  Location: [facility];  Service: Orthopedics;  Laterality: Right;  . TONSILLECTOMY    . TUBAL LIGATION Bilateral 10/12/2013   Procedure: BILATERAL TUBAL LIGATION;  Surgeon: Sherian Rein, MD;  Location: [facility];  Service: Obstetrics;  Laterality: Bilateral;     OB History    Gravida  2   Para  2   Term  2   Preterm      AB      Living  2     SAB      TAB      Ectopic      Multiple  Live Births  2           No family history on file.  Social History   Tobacco Use  . Smoking status: Never Smoker  . Smokeless tobacco: Never Used  Vaping Use  . Vaping Use: Never used  Substance Use Topics  . Alcohol use: No  . Drug use: No    Home Medications Prior to Admission medications   Medication Sig Start Date End Date Taking? Authorizing Provider  albuterol (VENTOLIN HFA) 108 (90 Base) MCG/ACT inhaler Inhale 1-2 puffs into the lungs every 6 (six) hours as needed for wheezing or shortness of breath. 08/16/19  Yes Gottschalk, [name], DO  BREO ELLIPTA 100-25  MCG/INH AEPB INHALE 1 PUFF INTO THE LUNGS DAILY 11/09/19  Yes Ambs, Norvel Richards, FNP  clonazePAM (KLONOPIN) 0.5 MG tablet Take 0.5 mg by mouth daily as needed for anxiety.  12/19/19  Yes [provider]  Dapsone 5 % topical gel Apply 1 application topically 2 (two) times a week. 11/13/19  Yes [provider]  EPINEPHrine (EPIPEN 2-PAK) 0.3 mg/0.3 mL IJ SOAJ injection Inject 0.3 mLs (0.3 mg total) into the muscle as needed for anaphylaxis. 07/12/19  Yes Joy, Shawn C, PA-C  gabapentin (NEURONTIN) 100 MG capsule Take 100-300 mg by mouth daily. 12/19/19  Yes [provider]  venlafaxine XR (EFFEXOR-XR) 75 MG 24 hr capsule Take 225 mg by mouth daily. 12/26/19  Yes [provider]  Fluticasone-Salmeterol,sensor, (AIRDUO DIGIHALER) 232-14 MCG/ACT AEPB Inhale 1 puff into the lungs 2 (two) times daily. Patient not taking: Reported on 01/14/2020 12/10/19   Alfonse Spruce, MD    Allergies    Polyethylene glycol and Keflex [cephalexin]  Review of Systems   Review of Systems  All other systems reviewed and are negative.   Physical Exam Updated Vital Signs BP 119/76 (BP Location: Left Arm)   Pulse 70   Temp 98.2 F (36.8 C) (Oral)   Resp 16   Ht 1.575 m (5\' 2" )   Wt 104.3 kg   LMP 01/07/2020   SpO2 98%   BMI 42.07 kg/m   Physical Exam Vitals and nursing note reviewed.  Constitutional:      General: She is not in acute distress.    Appearance: She is well-developed.  HENT:     Head: Normocephalic and atraumatic.     Right Ear: External ear normal.     Left Ear: External ear normal.  Eyes:     General: No scleral icterus.       Right eye: No discharge.        Left eye: No discharge.     Conjunctiva/sclera: Conjunctivae normal.  Neck:     Trachea: No tracheal deviation.  Cardiovascular:     Rate and Rhythm: Normal rate and regular rhythm.  Pulmonary:     Effort: Pulmonary effort is normal. No respiratory distress.     Breath sounds: Normal breath sounds.  No stridor. No wheezing or rales.  Abdominal:     General: Bowel sounds are normal. There is no distension.     Palpations: Abdomen is soft.     Tenderness: There is no abdominal tenderness. There is no guarding or rebound.  Musculoskeletal:        General: No tenderness.     Cervical back: Neck supple.  Skin:    General: Skin is warm and dry.     Findings: No rash.  Neurological:     Mental Status: She is alert.  Cranial Nerves: No cranial nerve deficit (no facial droop, extraocular movements intact, no slurred speech).     Sensory: No sensory deficit.     Motor: No abnormal muscle tone or seizure activity.     Coordination: Coordination normal.     ED Results / Procedures / Treatments   Labs (all labs ordered are listed, but only abnormal results are displayed) Labs Reviewed  BASIC METABOLIC PANEL  CBC  D-DIMER, QUANTITATIVE (NOT AT [facility])  TROPONIN I (HIGH SENSITIVITY)  TROPONIN I (HIGH SENSITIVITY)    EKG EKG Interpretation  Date/Time:  Sunday January 14 2020 11:35:07 EDT Ventricular Rate:  72 PR Interval:  186 QRS Duration: 90 QT Interval:  394 QTC Calculation: 431 R Axis:   7 Text Interpretation: Normal sinus rhythm Nonspecific ST and T wave abnormality Abnormal ECG No significant change since last tracing Confirmed by Linwood Dibbles [phone]) on 01/14/2020 2:58:44 PM   Radiology DG Chest 2 View  Result Date: 01/14/2020 CLINICAL DATA:  Chest pain and shortness of breath for over a month, worsened symptoms after exertion, onset after COVID vaccine per patient EXAM: CHEST - 2 VIEW COMPARISON:  12/18/2019 FINDINGS: Normal heart size, mediastinal contours, and pulmonary vascularity. Lungs clear. No pleural effusion or pneumothorax. Bones unremarkable. IMPRESSION: Normal exam, unchanged. Electronically Signed   By: Ulyses Southward M.D.   On: 01/14/2020 12:04    Procedures Procedures (including critical care time)  Medications Ordered in ED Medications - No data to  display  ED Course  I have reviewed the triage vital signs and the nursing notes.  Pertinent labs & imaging results that were available during my care of the patient were reviewed by me and considered in my medical decision making (see chart for details).  Clinical Course as of Jan 14 1648  Wynelle Link Jan 14, 2020  1610 Delta troponin normal   [JK]    Clinical Course User Index [JK] Linwood Dibbles, MD   MDM Rules/Calculators/A&P HEAR Score: 5                        Pt with complaints of chest pain.  Ongoing for a month.  ED workup reassuring.  Labs normal.  D dimer negative.  CXR negative.  Doubt PE, ACS.  Pt has noted exertional component.  Has plans for outpatient cardiac eval.  Appears stable to continue outpatient management at this time. Final Clinical Impression(s) / ED Diagnoses Final diagnoses:  Chest pain, unspecified type    Rx / DC Orders ED Discharge Orders    None       Linwood Dibbles, MD 01/14/20 1649 Pt requested a covid test prior to discharge.   Linwood Dibbles, MD 01/14/20 1655

## 2020-01-14 NOTE — ED Triage Notes (Addendum)
Pt c/o intermittent SOB and chest pain for over a month. Pt reports that she has been evaluated multiple times and was told it was anxiety. States symptoms are worsened with exertion and that it began after she got her COVID vaccine.

## 2020-01-15 ENCOUNTER — Ambulatory Visit: Payer: BC Managed Care – PPO | Admitting: Family Medicine

## 2020-01-15 ENCOUNTER — Encounter: Payer: Self-pay | Admitting: Family Medicine

## 2020-01-15 ENCOUNTER — Other Ambulatory Visit: Payer: Self-pay

## 2020-01-15 VITALS — BP 108/77 | HR 92 | Temp 97.5°F | Ht 62.0 in | Wt 232.6 lb

## 2020-01-15 DIAGNOSIS — R0600 Dyspnea, unspecified: Secondary | ICD-10-CM

## 2020-01-15 DIAGNOSIS — R079 Chest pain, unspecified: Secondary | ICD-10-CM

## 2020-01-15 DIAGNOSIS — R0609 Other forms of dyspnea: Secondary | ICD-10-CM

## 2020-01-15 DIAGNOSIS — E669 Obesity, unspecified: Secondary | ICD-10-CM | POA: Insufficient documentation

## 2020-01-15 MED ORDER — NITROGLYCERIN 0.4 MG SL SUBL: 0.4000 mg | SUBLINGUAL_TABLET | SUBLINGUAL | 3 refills | Status: DC | PRN

## 2020-01-15 NOTE — Patient Instructions (Signed)
Urgent referral to cardiology placed today. Nitroglycerin under the tongue every 5 minutes as needed for chest pain. If after 3 tablets, there is no improvement, go to ER.    Nonspecific Chest Pain, Adult Chest pain can be caused by many different conditions. It can be caused by a condition that is life-threatening and requires treatment right away. It can also be caused by something that is not life-threatening. If you have chest pain, it can be hard to know the difference, so it is important to get help right away to make sure that you do not have a serious condition. Some life-threatening causes of chest pain include:  Heart attack.  A tear in the body's main blood vessel (aortic dissection).  Inflammation around your heart (pericarditis).  A problem in the lungs, such as a blood clot (pulmonary embolism) or a collapsed lung (pneumothorax). Some non life-threatening causes of chest pain include:  Heartburn.  Anxiety or stress.  Damage to the bones, muscles, and cartilage that make up your chest wall.  Pneumonia or bronchitis.  Shingles infection (varicella-zoster virus). Chest pain can feel like:  Pain or discomfort on the surface of your chest or deep in your chest.  Crushing, pressure, aching, or squeezing pain.  Burning or tingling.  Dull or sharp pain that is worse when you move, cough, or take a deep breath.  Pain or discomfort that is also felt in your back, neck, jaw, shoulder, or arm, or pain that spreads to any of these areas. Your chest pain may come and go. It may also be constant. Your health care provider will do lab tests and other studies to find the cause of your pain. Treatment will depend on the cause of your chest pain. Follow these instructions at home: Medicines  Take over-the-counter and prescription medicines only as told by your health care provider.  If you were prescribed an antibiotic, take it as told by your health care provider. Do not stop  taking the antibiotic even if you start to feel better. Lifestyle   Rest as directed by your health care provider.  Do not use any products that contain nicotine or tobacco, such as cigarettes and e-cigarettes. If you need help quitting, ask your health care provider.  Do not drink alcohol.  Make healthy lifestyle choices as recommended. These may include: ? Getting regular exercise. Ask your health care provider to suggest some activities that are safe for you. ? Eating a heart-healthy diet. This includes plenty of fresh fruits and vegetables, whole grains, low-fat (lean) protein, and low-fat dairy products. A dietitian can help you find healthy eating options. ? Maintaining a healthy weight. ? Managing any other health conditions you have, such as high blood pressure (hypertension) or diabetes. ? Reducing stress, such as with yoga or relaxation techniques. General instructions  Pay attention to any changes in your symptoms. Tell your health care provider about them or any new symptoms.  Avoid any activities that cause chest pain.  Keep all follow-up visits as told by your health care provider. This is important. This includes visits for any further testing if your chest pain does not go away. Contact a health care provider if:  Your chest pain does not go away.  You feel depressed.  You have a fever. Get help right away if:  Your chest pain gets worse.  You have a cough that gets worse, or you cough up blood.  You have severe pain in your abdomen.  You faint.  You have sudden, unexplained chest discomfort.  You have sudden, unexplained discomfort in your arms, back, neck, or jaw.  You have shortness of breath at any time.  You suddenly start to sweat, or your skin gets clammy.  You feel nausea or you vomit.  You suddenly feel lightheaded or dizzy.  You have severe weakness, or unexplained weakness or fatigue.  Your heart begins to beat quickly, or it feels like  it is skipping beats. These symptoms may represent a serious problem that is an emergency. Do not wait to see if the symptoms will go away. Get medical help right away. Call your local emergency services (911 in the [place] Do not drive yourself to the hospital. Summary  Chest pain can be caused by a condition that is serious and requires urgent treatment. It may also be caused by something that is not life-threatening.  If you have chest pain, it is very important to see your health care provider. Your health care provider may do lab tests and other studies to find the cause of your pain.  Follow your health care provider's instructions on taking medicines, making lifestyle changes, and getting emergency treatment if symptoms become worse.  Keep all follow-up visits as told by your health care provider. This includes visits for any further testing if your chest pain does not go away. This information is not intended to replace advice given to you by your health care provider. Make sure you discuss any questions you have with your health care provider. Document Revised: 10/21/2017 Document Reviewed: 10/21/2017 Elsevier Patient Education  2020 [employer].

## 2020-01-15 NOTE — Progress Notes (Signed)
Subjective: CC: chest pain PCP: [name], [name], DO  [id number]:Christy Gay is a 44 y.o. female presenting to clinic today for:  1. Chest pain  [name] reports chest pain that is on and off for the last 5 months. It has gotten worse in the last month. She describes it as a twinge in her left chest. It sometimes radiates to her left shoulder, neck, and/or arm. She does have shortness of breath on exertion as well. She can walk up 2 flights are stairs before getting short of breath. She will usually have chest pain after 2 flights of stairs as well. She reports chest pain daily and that it typically occurs in the evenings when she is home from work. Denies edema, cough, fever, dizziness, diaphoresis, fatigue. Denies personal history of heart disease, diabetes, high cholesterol, or hypertension. Her great grandmother had heart disease.     She reports her chest pain and shortness of breath started after getting the Covid vaccine in March and having an allergic reaction to the vaccine. She saw an allergist after and was confirmed to be allergic to the vaccine components and has been treated for asthma since. She uses a Breo inhaler daily and albuterol inhaler occasionally. She reports relief with albuterol when she uses it, but does not always use it for shortness of breath and she doesn't really like to use it.    She recently went to urgent care and was told that it was likely anxiety in orgin. She was seen in the ED yesterday for chest pain. She had a negative CXR, labwork, unremarkable EKG, negative D-dimer, and normal troponin level. She was tested for Covid yesterday as well and it came back negative this morning.   She takes Effexor for anxiety. 2 weeks ago her psychiatrist also prescribed klonopin and gabapentin. She reports the klonopin makes her fall asleep but does help. She has a therapist and psychiatrist that she sees regularly, with last visit last week. She denies recent stressful events  and denies panic attacks. She does not think her symptoms are anxiety related.   She denies stomach pain, acid reflux, regurgitation, N/V.  Denies fever, fatigue, visual disturbances.  She recently had her hormones and thyroid checked by her GYN that were normal.  She is not currently having chest pain.  Relevant past medical, surgical, family, and social history reviewed and updated as indicated.  Allergies and medications reviewed and updated.  Allergies  Allergen Reactions  . Polyethylene Glycol Shortness Of Breath and Itching  . Keflex [Cephalexin] Rash   Past Medical History:  Diagnosis Date  . Arthritis    neck, upper back  . 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 Diabetes mellitus without complication (HCC)    gestational with first pregnancy  . Headache(784.0)    migraines  . Normal pregnancy, repeat 10/11/2013    Current Outpatient Medications:  .  albuterol (VENTOLIN HFA) 108 (90 Base) MCG/ACT inhaler, Inhale 1-2 puffs into the lungs every 6 (six) hours as needed for wheezing or shortness of breath., Disp: 18 g, Rfl: 2 .  BREO ELLIPTA 100-25 MCG/INH AEPB, INHALE 1 PUFF INTO THE LUNGS DAILY, Disp: 60 each, Rfl: 0 .  clonazePAM (KLONOPIN) 0.5 MG tablet, Take 0.5 mg by mouth daily as needed for anxiety. , Disp: , Rfl:  .  Dapsone 5 % topical gel, Apply 1 application topically 2 (two) times a week., Disp: , Rfl:  .  EPINEPHrine (EPIPEN 2-PAK)  0.3 mg/0.3 mL IJ SOAJ injection, Inject 0.3 mLs (0.3 mg total) into the muscle as needed for anaphylaxis., Disp: 1 each, Rfl: 0 .  Fluticasone-Salmeterol,sensor, (AIRDUO DIGIHALER) 232-14 MCG/ACT AEPB, Inhale 1 puff into the lungs 2 (two) times daily., Disp: 1 each, Rfl: 5 .  gabapentin (NEURONTIN) 100 MG capsule, Take 100-300 mg by mouth daily., Disp: , Rfl:  .  venlafaxine XR (EFFEXOR-XR) 75 MG 24 hr capsule, Take 225 mg by mouth daily., Disp: , Rfl:  Social History   Socioeconomic History  . Marital status:  Married    Spouse name: Not on file  . Number of children: Not on file  . Years of education: Not on file  . Highest education level: Not on file  Occupational History  . Not on file  Tobacco Use  . Smoking status: Never Smoker  . Smokeless tobacco: Never Used  Vaping Use  . Vaping Use: Never used  Substance and Sexual Activity  . Alcohol use: No  . Drug use: No  . Sexual activity: Not on file  Other Topics Concern  . Not on file  Social History Narrative  . Not on file   Social Determinants of Health   Financial Resource Strain:   . Difficulty of Paying Living Expenses: Not on file  Food Insecurity:   . Worried About Programme researcher, broadcasting/film/video in the Last Year: Not on file  . Ran Out of Food in the Last Year: Not on file  Transportation Needs:   . Lack of Transportation (Medical): Not on file  . Lack of Transportation (Non-Medical): Not on file  Physical Activity:   . Days of Exercise per Week: Not on file  . Minutes of Exercise per Session: Not on file  Stress:   . Feeling of Stress : Not on file  Social Connections:   . Frequency of Communication with Friends and Family: Not on file  . Frequency of Social Gatherings with Friends and Family: Not on file  . Attends Religious Services: Not on file  . Active Member of Clubs or Organizations: Not on file  . Attends Banker Meetings: Not on file  . Marital Status: Not on file  Intimate Partner Violence:   . Fear of Current or Ex-Partner: Not on file  . Emotionally Abused: Not on file  . Physically Abused: Not on file  . Sexually Abused: Not on file   History reviewed.  Review of Systems  See HPI  Objective: Office vital signs reviewed. BP 108/77   Pulse 92   Temp (!) 97.5 F (36.4 C) (Temporal)   Ht 5\' 2"  (1.575 m)   Wt 232 lb 9.6 oz (105.5 kg)   LMP 01/07/2020   SpO2 100%   BMI 42.54 kg/m   Physical Examination:  Physical Exam Vitals and nursing note reviewed.  Constitutional:      General:  She is not in acute distress.    Appearance: She is not ill-appearing, toxic-appearing or diaphoretic.  HENT:     Head: Normocephalic and atraumatic.  Neck:     Vascular: No carotid bruit.  Cardiovascular:     Rate and Rhythm: Normal rate and regular rhythm.     Chest Wall: No thrill.     Pulses: Normal pulses.     Heart sounds: Normal heart sounds. No murmur heard.  No gallop.   Pulmonary:     Effort: Pulmonary effort is normal. No respiratory distress.     Breath sounds: Normal breath sounds.  No wheezing or rales.  Chest:     Chest wall: No tenderness.  Abdominal:     General: Bowel sounds are normal. There is no distension.     Palpations: Abdomen is soft.     Tenderness: There is no abdominal tenderness. There is no guarding or rebound.  Musculoskeletal:     Cervical back: Normal range of motion and neck supple. No tenderness.     Right lower leg: No edema.     Left lower leg: No edema.  Skin:    General: Skin is warm and dry.     Capillary Refill: Capillary refill takes less than 2 seconds.  Neurological:     General: No focal deficit present.     Mental Status: She is alert and oriented to person, place, and time.     Motor: No weakness.     Gait: Gait normal.  Psychiatric:        Mood and Affect: Mood normal.        Behavior: Behavior normal.        Thought Content: Thought content normal.      Results for orders placed or performed during the hospital encounter of 01/14/20  SARS Coronavirus 2 by RT PCR (hospital order, performed in [facility] hospital lab) Nasopharyngeal Nasopharyngeal Swab   Specimen: Nasopharyngeal Swab  Result Value Ref Range   SARS Coronavirus 2 NEGATIVE NEGATIVE  Basic metabolic panel  Result Value Ref Range   Sodium 141 135 - 145 mmol/L   Potassium 3.8 3.5 - 5.1 mmol/L   Chloride 103 98 - 111 mmol/L   CO2 27 22 - 32 mmol/L   Glucose, Bld 92 70 - 99 mg/dL   BUN 9 6 - 20 mg/dL   Creatinine, Ser 7.51 0.44 - 1.00 mg/dL   Calcium 9.6  8.9 - 02.5 mg/dL   GFR calc non Af Amer >60 >60 mL/min   GFR calc Af Amer >60 >60 mL/min   Anion gap 11 5 - 15  CBC  Result Value Ref Range   WBC 6.9 4.0 - 10.5 K/uL   RBC 4.91 3.87 - 5.11 MIL/uL   Hemoglobin 14.3 12.0 - 15.0 g/dL   HCT 85.2 36 - 46 %   MCV 91.9 80.0 - 100.0 fL   MCH 29.1 26.0 - 34.0 pg   MCHC 31.7 30.0 - 36.0 g/dL   RDW 77.8 24.2 - 35.3 %   Platelets 333 150 - 400 K/uL   nRBC 0.0 0.0 - 0.2 %  D-dimer, quantitative (not at [facility])  Result Value Ref Range   D-Dimer, Quant <0.27 0.00 - 0.50 ug/mL-FEU  Troponin I (High Sensitivity)  Result Value Ref Range   Troponin I (High Sensitivity) <2 <18 ng/L  Troponin I (High Sensitivity)  Result Value Ref Range   Troponin I (High Sensitivity) <2 <18 ng/L     Assessment/ Plan: Raetta was seen today for hospitalization follow-up.  Diagnoses and all orders for this visit:  Consulted with Dr. Darlyn Read regarding case, who agrees with assessment and plan.   Chest pain, unspecified type No imaging or lab work today, as all has been recently completed and reassuring. Reviewed ED note, imaging, EKG, and lab work from yesterday. Did not complete another EKG today as she is not currently have chest pain. Discussed possibility that anxiety is a component of her symptoms, but that her symptoms are concerning. Chest pain does not appear to be musculoskeletal or GI in origin. Placed urgent referral to cardiology.  Discussed betablocker, but her BP is on the low end already. Nitroglycerin prescription given PRN and discussed when to go to the ED.  -     nitroGLYCERIN (NITROSTAT) 0.4 MG SL tablet; Place 1 tablet (0.4 mg total) under the tongue every 5 (five) minutes as needed for chest pain. -     Ambulatory referral to Cardiology  Dyspnea on exertion Use albuterol inhaler when short of breath for relief. Discussed when to go to the ED.   Follow up in 1 month to reassess, sooner if symptoms worsen or per cardiology request.    The above  assessment and management plan was discussed with the patient. The patient verbalized understanding of and has agreed to the management plan. Patient is aware to call the clinic if symptoms persist or worsen. Patient is aware when to return to the clinic for a follow-up visit. Patient educated on when it is appropriate to go to the emergency department.   Harlow Mares, FNP-C [facility] [address] [place], [place] [address] [phone]

## 2020-01-16 ENCOUNTER — Other Ambulatory Visit: Payer: Self-pay | Admitting: Family Medicine

## 2020-01-30 DIAGNOSIS — R072 Precordial pain: Secondary | ICD-10-CM | POA: Insufficient documentation

## 2020-01-30 DIAGNOSIS — Z7189 Other specified counseling: Secondary | ICD-10-CM | POA: Insufficient documentation
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
## 2020-01-31 ENCOUNTER — Encounter: Payer: Self-pay | Admitting: Cardiology

## 2020-01-31 ENCOUNTER — Ambulatory Visit (INDEPENDENT_AMBULATORY_CARE_PROVIDER_SITE_OTHER): Payer: BC Managed Care – PPO | Admitting: Cardiology

## 2020-01-31 ENCOUNTER — Other Ambulatory Visit: Payer: Self-pay

## 2020-01-31 VITALS — BP 118/82 | HR 80 | Ht 62.0 in | Wt 232.0 lb

## 2020-01-31 DIAGNOSIS — R0602 Shortness of breath: Secondary | ICD-10-CM | POA: Diagnosis not present

## 2020-01-31 DIAGNOSIS — R072 Precordial pain: Secondary | ICD-10-CM | POA: Diagnosis not present

## 2020-01-31 DIAGNOSIS — Z7189 Other specified counseling: Secondary | ICD-10-CM | POA: Diagnosis not present

## 2020-01-31 NOTE — Patient Instructions (Signed)
Medication Instructions:  The current medical regimen is effective;  continue present plan and medications.  *If you need a refill on your cardiac medications before your next appointment, please call your pharmacy*  Lab Work: Please have blood work at [facility]  (Pro BNP) If you have labs (blood work) drawn today and your tests are completely normal, you will receive your results only by: Marland Kitchen MyChart Message (if you have MyChart) OR . A paper copy in the mail If you have any lab test that is abnormal or we need to change your treatment, we will call you to review the results.   Testing/Procedures: Your physician has requested that you have an exercise tolerance test. For further information please visit [email or web address] Please also follow instruction sheet, as given.  Follow-Up: At [facility], you and your health needs are our priority.  As part of our continuing mission to provide you with exceptional heart care, we have created designated Provider Care Teams.  These Care Teams include your primary Cardiologist (physician) and Advanced Practice Providers (APPs -  Physician Assistants and Nurse Practitioners) who all work together to provide you with the care you need, when you need it.  We recommend signing up for the patient portal called "MyChart".  Sign up information is provided on this After Visit Summary.  MyChart is used to connect with patients for Virtual Visits (Telemedicine).  Patients are able to view lab/test results, encounter notes, upcoming appointments, etc.  Non-urgent messages can be sent to your provider as well.   To learn more about what you can do with MyChart, go to [email or web address].    Follow up as needed after the above testing.  Thank you for choosing [facility]!!

## 2020-02-02 ENCOUNTER — Other Ambulatory Visit: Payer: Self-pay

## 2020-02-02 ENCOUNTER — Other Ambulatory Visit: Payer: BC Managed Care – PPO

## 2020-02-02 DIAGNOSIS — R0602 Shortness of breath: Secondary | ICD-10-CM

## 2020-02-03 LAB — PRO B NATRIURETIC PEPTIDE: NT-Pro BNP: 62 pg/mL (ref 0–249)

## 2020-02-06 ENCOUNTER — Telehealth: Payer: Self-pay | Admitting: Cardiology

## 2020-02-06 NOTE — Telephone Encounter (Signed)
Patient is requesting to schedule ETT and COVID test.

## 2020-02-12 ENCOUNTER — Ambulatory Visit (INDEPENDENT_AMBULATORY_CARE_PROVIDER_SITE_OTHER): Payer: BC Managed Care – PPO | Admitting: Family Medicine

## 2020-02-12 ENCOUNTER — Encounter: Payer: Self-pay | Admitting: Family Medicine

## 2020-02-12 ENCOUNTER — Other Ambulatory Visit: Payer: Self-pay

## 2020-02-12 VITALS — BP 108/70 | HR 87 | Temp 97.9°F | Ht 62.0 in | Wt 234.2 lb

## 2020-02-12 DIAGNOSIS — R0609 Other forms of dyspnea: Secondary | ICD-10-CM

## 2020-02-12 DIAGNOSIS — R0600 Dyspnea, unspecified: Secondary | ICD-10-CM

## 2020-02-12 DIAGNOSIS — R079 Chest pain, unspecified: Secondary | ICD-10-CM | POA: Diagnosis not present
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
## 2020-02-14 ENCOUNTER — Telehealth: Payer: Self-pay

## 2020-02-14 DIAGNOSIS — R0602 Shortness of breath: Secondary | ICD-10-CM

## 2020-02-14 DIAGNOSIS — J4540 Moderate persistent asthma, uncomplicated: Secondary | ICD-10-CM

## 2020-02-14 NOTE — Telephone Encounter (Signed)
It was a full pulmonary function testing with a methacholine challenge. Test ordered. Do you call to schedule or does the patient do it?   Malachi Bonds, MD [facility]

## 2020-02-14 NOTE — Telephone Encounter (Signed)
Patient called to cx her appointment for Friday. She wanted to cx because her exercise stress test isn't till 10/26 & she wanted to have the results before following up with [place].  Pagtient also mentioned a pulmonary test that she states Dr Dellis Anes wanted her to do. Do you know what this test is Dr Dellis Anes? Also she wants to go ahead and schedule for it.  Please Advise.

## 2020-02-16 ENCOUNTER — Ambulatory Visit: Payer: BC Managed Care – PPO | Admitting: Allergy & Immunology

## 2020-02-19 NOTE — Telephone Encounter (Signed)
So I got a number for centralized scheduling, but I was transferred everywhere but the right place. [phone]) I reached out to [facility] to see if they do this testing. They did confirm that their office does do this testing.  An order has to be put in for their office to schedule. Once this order is in we will have to call [facility] to schedule.  Dr Dellis Anes can you do another order without [facility] Hawking being in the order?   I left a very detailed voicemail for the patient.  I am out of the office Tuesday-Thursday. I did inform the patient via voicemail that I would be out & would give her a call on Friday regarding her appointment for the PFT.     [facility] [address] [place],  [place]  [address] Phone: [phone]

## 2020-02-20 NOTE — Addendum Note (Signed)
Addended by: Alfonse Spruce on: 02/20/2020 08:32 AM   Modules accepted: Orders

## 2020-02-20 NOTE — Telephone Encounter (Signed)
Okay, I reordered it with [facility].   Malachi Bonds, MD [facility]

## 2020-02-23 ENCOUNTER — Other Ambulatory Visit (HOSPITAL_COMMUNITY)
Admission: RE | Admit: 2020-02-23 | Discharge: 2020-02-23 | Disposition: A | Discharge status: Home / Self Care | Payer: BC Managed Care – PPO | Source: Ambulatory Visit | Attending: Cardiology | Admitting: Cardiology

## 2020-02-23 DIAGNOSIS — Z01812 Encounter for preprocedural laboratory examination: Secondary | ICD-10-CM | POA: Insufficient documentation

## 2020-02-23 DIAGNOSIS — Z20822 Contact with and (suspected) exposure to covid-19: Secondary | ICD-10-CM | POA: Insufficient documentation

## 2020-02-23 LAB — SARS CORONAVIRUS 2 (TAT 6-24 HRS): SARS Coronavirus 2: NEGATIVE

## 2020-02-23 NOTE — Telephone Encounter (Signed)
I called [facility] to make sure the order is in correct & it is. Their office is going to reach out to the patient to schedule. Patient informed of their contact information.   Thanks

## 2020-02-27 ENCOUNTER — Ambulatory Visit (INDEPENDENT_AMBULATORY_CARE_PROVIDER_SITE_OTHER): Payer: BC Managed Care – PPO

## 2020-02-27 ENCOUNTER — Other Ambulatory Visit: Payer: Self-pay

## 2020-02-27 ENCOUNTER — Other Ambulatory Visit (HOSPITAL_COMMUNITY): Payer: BC Managed Care – PPO

## 2020-02-27 DIAGNOSIS — R072 Precordial pain: Secondary | ICD-10-CM

## 2020-02-27 DIAGNOSIS — R0602 Shortness of breath: Secondary | ICD-10-CM | POA: Diagnosis not present

## 2020-02-27 LAB — EXERCISE TOLERANCE TEST
Estimated workload: 10.1 METS
Exercise duration (min): 7 min
Exercise duration (sec): 0 s
MPHR: 174 {beats}/min
Peak HR: 153 {beats}/min
Percent HR: 87 %
RPE: 17
Rest HR: 75 {beats}/min

## 2020-03-09 ENCOUNTER — Other Ambulatory Visit (HOSPITAL_COMMUNITY)
Admission: RE | Admit: 2020-03-09 | Discharge: 2020-03-09 | Disposition: A | Discharge status: Home / Self Care | Payer: BC Managed Care – PPO | Source: Ambulatory Visit | Attending: Allergy & Immunology | Admitting: Allergy & Immunology

## 2020-03-09 DIAGNOSIS — Z20822 Contact with and (suspected) exposure to covid-19: Secondary | ICD-10-CM | POA: Diagnosis not present

## 2020-03-09 DIAGNOSIS — Z01818 Encounter for other preprocedural examination: Secondary | ICD-10-CM | POA: Diagnosis present

## 2020-03-09 LAB — SARS CORONAVIRUS 2 (TAT 6-24 HRS): SARS Coronavirus 2: NEGATIVE

## 2020-03-12 ENCOUNTER — Other Ambulatory Visit: Payer: Self-pay

## 2020-03-12 ENCOUNTER — Ambulatory Visit (INDEPENDENT_AMBULATORY_CARE_PROVIDER_SITE_OTHER): Payer: BC Managed Care – PPO | Admitting: Internal Medicine

## 2020-03-12 DIAGNOSIS — J4540 Moderate persistent asthma, uncomplicated: Secondary | ICD-10-CM | POA: Diagnosis not present

## 2020-03-12 DIAGNOSIS — R0602 Shortness of breath: Secondary | ICD-10-CM

## 2020-03-12 LAB — PULMONARY FUNCTION TEST
DL/VA % pred: 124 %
DL/VA: 5.48 ml/min/mmHg/L
DLCO cor % pred: 123 %
DLCO cor: 25.48 ml/min/mmHg
DLCO unc % pred: 126 %
DLCO unc: 26.16 ml/min/mmHg
FEF 25-75 Post: 2.59 L/sec
FEF 25-75 Pre: 2.69 L/sec
FEF2575-%Change-Post: -3 %
FEF2575-%Pred-Post: 90 %
FEF2575-%Pred-Pre: 94 %
FEV1-%Change-Post: -4 %
FEV1-%Pred-Post: 83 %
FEV1-%Pred-Pre: 87 %
FEV1-Post: 2.34 L
FEV1-Pre: 2.45 L
FEV1FVC-%Change-Post: 4 %
FEV1FVC-%Pred-Pre: 105 %
FEV6-%Change-Post: -8 %
FEV6-%Pred-Post: 76 %
FEV6-%Pred-Pre: 83 %
FEV6-Post: 2.61 L
FEV6-Pre: 2.86 L
FEV6FVC-%Pred-Post: 102 %
FEV6FVC-%Pred-Pre: 102 %
FVC-%Change-Post: -8 %
FVC-%Pred-Post: 74 %
FVC-%Pred-Pre: 81 %
FVC-Post: 2.61 L
FVC-Pre: 2.86 L
Post FEV1/FVC ratio: 90 %
Post FEV6/FVC ratio: 100 %
Pre FEV1/FVC ratio: 86 %
Pre FEV6/FVC Ratio: 100 %
RV % pred: 85 %
RV: 1.41 L
TLC % pred: 98 %
TLC: 4.81 L

## 2020-03-12 NOTE — Progress Notes (Signed)
PFT done today. 

## 2020-03-19 ENCOUNTER — Encounter: Payer: Self-pay | Admitting: Allergy & Immunology

## 2020-03-20 ENCOUNTER — Telehealth: Payer: Self-pay | Admitting: Internal Medicine

## 2020-03-20 NOTE — Telephone Encounter (Signed)
Spoke with [facility] with Allergy and Asthma. Dr. Dellis Anes was wanting to know why the pt did not have a methacholine challenge when her PFT was done. Advised Dee that we do not do methacholine challenges here at our office. Advised her that these are done at either [facility] or [facility]. Nothing further was needed.

## 2020-03-21 ENCOUNTER — Other Ambulatory Visit: Payer: Self-pay | Admitting: Family Medicine

## 2020-03-22 ENCOUNTER — Other Ambulatory Visit: Payer: Self-pay

## 2020-03-22 ENCOUNTER — Ambulatory Visit (INDEPENDENT_AMBULATORY_CARE_PROVIDER_SITE_OTHER): Payer: BC Managed Care – PPO | Admitting: Allergy & Immunology

## 2020-03-22 ENCOUNTER — Encounter: Payer: Self-pay | Admitting: Allergy & Immunology

## 2020-03-22 DIAGNOSIS — R0602 Shortness of breath: Secondary | ICD-10-CM

## 2020-03-22 DIAGNOSIS — J4540 Moderate persistent asthma, uncomplicated: Secondary | ICD-10-CM | POA: Diagnosis not present

## 2020-03-22 DIAGNOSIS — J302 Other seasonal allergic rhinitis: Secondary | ICD-10-CM

## 2020-03-22 DIAGNOSIS — T50Z95D Adverse effect of other vaccines and biological substances, subsequent encounter: Secondary | ICD-10-CM

## 2020-03-22 DIAGNOSIS — Z887 Allergy status to serum and vaccine status: Secondary | ICD-10-CM | POA: Diagnosis not present

## 2020-03-22 MED ORDER — BREO ELLIPTA 100-25 MCG/INH IN AEPB: 1.0000 | INHALATION_SPRAY | Freq: Every day | RESPIRATORY_TRACT | 6 refills | Status: DC

## 2020-03-22 NOTE — Progress Notes (Signed)
RE: Christy Gay MRN: [id number] DOB: 1975-12-18 Date of Telemedicine Visit: 03/22/2020  Referring provider: [name], DO Primary care provider: [name], DO  Chief Complaint: Asthma (Televisit at home. Patient gave verbal consent to treat and bill insurance for this visit.)   Telemedicine Follow Up Visit via Telephone: I connected with Christy Gay for a follow up on 03/22/20 by telephone and verified that I am speaking with the correct person using two identifiers.   I discussed the limitations, risks, security and privacy concerns of performing an evaluation and management service by telephone and the availability of in person appointments. I also discussed with the patient that there may be a patient responsible charge related to this service. The patient expressed understanding and agreed to proceed.  Patient is at work.  Provider is at the office.  Visit start time: 1:38 PM Visit end time: 1:58 PM Insurance consent/check in by: [facility] consent and medical assistant/nurse: Kayla  History of Present Illness:  She is a 44 y.o. female, who is being followed for shortness of breath. Her previous allergy office visit was in August 2021 with myself.  At that time, we kept her diagnosis of moderate persistent asthma.  She has had continued shortness of breath since receiving the Covid vaccine in the spring.  She seemed to be doing better on the [place], but it was too expensive for her.  Therefore we changed her to AirDuo 1 puff twice daily.  We did send her to cardiology to rule out cardiac issues although I felt that this was unlikely.  We will continue with her albuterol as needed as well.  In the interim, she did go and see cardiology in September 2021 (Dr. Ancil Linsey).  At that time, he felt that her chest pain was atypical.  He also felt that there was a low pretest probability of obstructive coronary disease.  She was brought in for an exercise test.  For  her shortness of breath, she had a BNP level which was normal.  She was also prescribed Nitrostat which helped the chest pain. She had a stress test on October 26, which was reportedly normal. She never had an echocardiogram.    She also had full pulmonary function testing.  I had initially requested a methacholine challenge, but this was not done.  It seems that these are only done at [facility] or [facility].  However, neither places really doing them at this point in time due to the COVID-19 pandemic.  Her her lung function values were normal, but she did have some air trapping.  There was no improvement with the albuterol.  They did a DLCO that showed a possible heart issue, but her heart work-up has been normal.  Since the last visit, chest pain was worse. She thinks that she needs to go back to the cardiologist. She tries Zumba this week and the second night she got SOB. She has had problems with SOB with walking her dog and she has radiation down her arm. She will become short of breath. She has tried using the rescue inhaler and it does help somewhat. She reports having some chest pain now as we are talking.   She reports that using the [facility] does help with the chest pain, decreasing its frequency. The AirDuo did not seem to help as much. She has used the albuterol with some improvement in the chest pain. Any little thing tends to trigger it.    Otherwise,  there have been no changes to her past medical history, surgical history, family history, or social history.  Assessment and Plan:  Christy Gay is a 44 y.o. 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  However, since the Breo seems to decrease the frequency of the chest pain, we will go ahead and continue that.  We have tried switching to a different medication that might have been cheaper, but it did not seem to provide the same level of protection against the chest pain.  We will continue with albuterol as needed.  I do agree with the plan to send her to pulmonology.  We did place that referral today.  We did discuss the COVID-19 vaccine, and she prefers to hold off on the second 1 until we figure out this chest pain issue.  Diagnostics: None.  Medication List:  Current Outpatient Medications  Medication Sig Dispense Refill  . albuterol (VENTOLIN HFA) 108 (90 Base) MCG/ACT inhaler INHALE 1-2 PUFFS INTO THE LUNGS EVERY 6 (SIX) HOURS AS NEEDED FOR WHEEZING OR SHORTNESS OF BREATH. 18 each 0  . Dapsone 5 % topical gel Apply 1 application topically 2 (two) times a week.    Marland Kitchen EPINEPHrine (EPIPEN 2-PAK) 0.3 mg/0.3 mL IJ SOAJ injection Inject 0.3 mLs (0.3 mg total) into the muscle as needed for anaphylaxis. 1 each 0  . hydrOXYzine (ATARAX/VISTARIL) 25 MG tablet Take 25 mg by mouth 4 (four) times daily.    Marland Kitchen liothyronine (CYTOMEL) 25 MCG tablet     . nitroGLYCERIN (NITROSTAT) 0.4 MG SL tablet Place 1 tablet (0.4 mg total) under the tongue every 5 (five) minutes as needed for chest pain. 50 tablet 3  . Fluticasone-Salmeterol,sensor, (AIRDUO DIGIHALER) 232-14 MCG/ACT AEPB Inhale 1 puff into the lungs 2 (two) times daily. (Patient not taking: Reported on 03/22/2020) 1 each 5   No current facility-administered medications for this visit.   Allergies: Allergies  Allergen Reactions  . Polyethylene Glycol Shortness Of Breath and Itching  . Keflex [Cephalexin] Rash   I reviewed her past medical history, social history, family history, and environmental history and no significant changes have been reported from previous  visits.  Review of Systems  Constitutional: Negative for activity change, appetite change, chills and fever.  HENT: Negative for congestion, postnasal drip, rhinorrhea, sinus pressure and sore throat.   Eyes: Negative for pain, discharge, redness and itching.  Respiratory: Positive for chest tightness and shortness of breath. Negative for wheezing and stridor.   Gastrointestinal: Negative for diarrhea, nausea and vomiting.  Endocrine: Negative for cold intolerance and heat intolerance.  Musculoskeletal: Negative for arthralgias, joint swelling and myalgias.  Skin: Negative for rash.  Allergic/Immunologic: Negative for environmental allergies and food allergies.    Objective:  Physical exam not obtained as encounter was done via telephone.   Previous notes and tests were reviewed.  I discussed the assessment and treatment plan with the patient. The patient was provided an opportunity to ask questions and all were answered. The patient agreed with the plan and demonstrated an understanding of the instructions.   The  patient was advised to call back or seek an in-person evaluation if the symptoms worsen or if the condition fails to improve as anticipated.  I provided 20 minutes of non-face-to-face time during this encounter.  It was my pleasure to participate in Christy Gay care today. Please feel free to contact me with any questions or concerns.   Sincerely,  Alfonse Spruce, MD

## 2020-03-24 ENCOUNTER — Encounter: Payer: Self-pay | Admitting: Allergy & Immunology

## 2020-03-24 NOTE — Addendum Note (Signed)
Addended by: Alfonse Spruce on: 03/24/2020 09:57 AM   Modules accepted: Orders

## 2020-03-24 NOTE — Progress Notes (Signed)
Order placed for methacholine challenge. I clicked on [facility], but [facility] would work as well.   Malachi Bonds, MD [facility]

## 2020-03-27 NOTE — Progress Notes (Signed)
Referral has been placed. Patient has been informed. She states she will give them a call on Monday to get scheduled.   Thanks

## 2020-04-15 ENCOUNTER — Ambulatory Visit: Payer: BC Managed Care – PPO | Admitting: Family Medicine

## 2020-04-15 ENCOUNTER — Other Ambulatory Visit: Payer: Self-pay

## 2020-04-15 ENCOUNTER — Encounter: Payer: Self-pay | Admitting: Family Medicine

## 2020-04-15 VITALS — BP 115/65 | HR 98 | Temp 97.8°F | Ht 61.0 in | Wt 235.0 lb

## 2020-04-15 DIAGNOSIS — R0600 Dyspnea, unspecified: Secondary | ICD-10-CM | POA: Diagnosis not present

## 2020-04-15 DIAGNOSIS — R0609 Other forms of dyspnea: Secondary | ICD-10-CM

## 2020-04-15 DIAGNOSIS — T7840XA Allergy, unspecified, initial encounter: Secondary | ICD-10-CM | POA: Diagnosis not present

## 2020-04-15 MED ORDER — METHYLPREDNISOLONE ACETATE 80 MG/ML IJ SUSP: 80.0000 mg | Freq: Once | INTRAMUSCULAR | Status: DC

## 2020-04-15 NOTE — Patient Instructions (Signed)
Message me if you decide you want the second opinion  Terrilee Files, DO can offer osteopathic manual treatment for the rib (I think you have a dysfunction of that rib) Though a chiropractor might be able to help too

## 2020-04-15 NOTE — Progress Notes (Signed)
Subjective: CC: Allergic reaction PCP: [name], DO GQB:VQXIH B Christy Gay is a 44 y.o. female presenting to clinic today for:  1.  Allergic reaction Patient reports that she ate a big Mac yesterday for [employer] and she started having a rash across her chest as well as some weird sensation along her lips.  She took Atarax but not take her EpiPen.  Her breathing has not changed.  She continues to have dyspnea on exertion that is now chronic for her.  She is had work-up by cardiology but this was unremarkable.  She is also had evaluation by allergy and asthma but again they did not feel that there was an allergic etiology.  She has an appointment to establish care with a pulmonologist on the 22nd.  She is hoping that she will get some answers.  She considered seeking a second opinion from a cardiac standpoint as apparently her pulmonary function test, which were gotten by her allergist, suggested that there may be a cardiac change contributing.  She does report that the albuterol seems to help with some of the dyspnea on exertion but she is not really regularly use this prior to physical activity.  She continues to have issues with things like Zumba, which she was able to tolerate without difficulty previously   ROS: Per HPI  Allergies  Allergen Reactions  . Polyethylene Glycol Shortness Of Breath and Itching  . Keflex [Cephalexin] Rash   Past Medical History:  Diagnosis Date  . Arthritis    neck, upper back  . 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 Diabetes mellitus without complication (HCC)    gestational with first pregnancy  . Headache(784.0)    migraines  . Normal pregnancy, repeat 10/11/2013    Current Outpatient Medications:  .  albuterol (VENTOLIN HFA) 108 (90 Base) MCG/ACT inhaler, INHALE 1-2 PUFFS INTO THE LUNGS EVERY 6 (SIX) HOURS AS NEEDED FOR WHEEZING OR SHORTNESS OF BREATH., Disp: 18 each, Rfl: 0 .  Dapsone 5 % topical gel, Apply 1  application topically 2 (two) times a week., Disp: , Rfl:  .  EPINEPHrine (EPIPEN 2-PAK) 0.3 mg/0.3 mL IJ SOAJ injection, Inject 0.3 mLs (0.3 mg total) into the muscle as needed for anaphylaxis., Disp: 1 each, Rfl: 0 .  fluticasone furoate-vilanterol (BREO ELLIPTA) 100-25 MCG/INH AEPB, Inhale 1 puff into the lungs daily., Disp: 28 each, Rfl: 6 .  hydrOXYzine (ATARAX/VISTARIL) 25 MG tablet, Take 25 mg by mouth 4 (four) times daily., Disp: , Rfl:  .  liothyronine (CYTOMEL) 25 MCG tablet, , Disp: , Rfl:  .  nitroGLYCERIN (NITROSTAT) 0.4 MG SL tablet, Place 1 tablet (0.4 mg total) under the tongue every 5 (five) minutes as needed for chest pain., Disp: 50 tablet, Rfl: 3 Social History   Socioeconomic History  . Marital status: Married    Spouse name: Not on file  . Number of children: Not on file  . Years of education: Not on file  . Highest education level: Not on file  Occupational History  . Not on file  Tobacco Use  . Smoking status: Never Smoker  . Smokeless tobacco: Never Used  Vaping Use  . Vaping Use: Never used  Substance and Sexual Activity  . Alcohol use: No  . Drug use: No  . Sexual activity: Not on file  Other Topics Concern  . 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     Social Determinants of Health   Financial Resource Strain: Not on file  Food Insecurity: Not on file  Transportation Needs: Not on file  Physical Activity: Not on file  Stress: Not on file  Social Connections: Not on file  Intimate Partner Violence: Not on file   Family History  Problem Relation Age of Onset  . Heart disease Mother        RHD    Objective: Office vital signs reviewed. BP 115/65   Pulse 98   Temp 97.8 F (36.6 C) (Temporal)   Ht _0  (1.549 m)   Wt 235 lb (106.6 kg)   SpO2 98%   BMI 44.40 kg/m   Physical Examination:  General: Awake, alert, well nourished, No acute distress HEENT: No facial swelling  appreciated. Cardio: Regular rate and rhythm.  S1-S2 heard.  No murmur Pulmonary: Clear to auscultation bilaterally.  Normal work of breathing on room air.  No wheezes, rhonchi or rales Skin: She has a rash that is erythematous and maculopapular nature noted along the apex of the chest and extending into the left clavicle [place] area  Assessment/ Plan: 44 y.o. female   Allergic reaction, initial encounter - Plan: methylPREDNISolone acetate (DEPO-MEDROL) injection 80 mg  Dyspnea on exertion  Treated with Depo-Medrol here today.  Okay to continue Atarax.  She has ongoing dyspnea on exertion and frankly at this point I am not sure what the etiology is of it.  She has a visit to establish care with pulmonology in about a week.  She will let me know how this goes and if she desires a second opinion from a cardiac standpoint.  No orders of the defined types were placed in this encounter.  No orders of the defined types were placed in this encounter.    [name], DO [facility] [phone]

## 2020-04-24 ENCOUNTER — Encounter: Payer: Self-pay | Admitting: Pulmonary Disease

## 2020-04-24 ENCOUNTER — Other Ambulatory Visit: Payer: Self-pay

## 2020-04-24 ENCOUNTER — Ambulatory Visit: Payer: BC Managed Care – PPO | Admitting: Pulmonary Disease

## 2020-04-24 VITALS — BP 126/80 | HR 90 | Temp 97.0°F | Ht 61.0 in | Wt 229.0 lb

## 2020-04-24 DIAGNOSIS — R0609 Other forms of dyspnea: Secondary | ICD-10-CM

## 2020-04-24 DIAGNOSIS — R0600 Dyspnea, unspecified: Secondary | ICD-10-CM

## 2020-04-24 MED ORDER — BREO ELLIPTA 200-25 MCG/INH IN AEPB: 1.0000 | INHALATION_SPRAY | Freq: Every day | RESPIRATORY_TRACT | 11 refills | Status: DC

## 2020-04-24 MED ORDER — MONTELUKAST SODIUM 10 MG PO TABS
10.0000 mg | ORAL_TABLET | Freq: Every day | ORAL | 11 refills | Status: DC
Start: 2020-04-24 — End: 2020-08-01

## 2020-04-24 NOTE — Patient Instructions (Addendum)
Nice to meet you  I worry Christy Gay is contributing to your symptoms  Use Breo (high dose prescribed today) 1 puff daily. Rinse mouth with water after every use.  Take Singulair 1 pill daily - to help with asthma and allergies.   We will get labs today to see if a component of your immune system is overactive and making things worse.  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 If you need something more urgently please call our office.   Come back for follow up with Christy Gay in 3 months.  Notification of test results are managed in the following manner: If there are  any recommendations or changes to the  plan of care discussed in office today,  we will contact you and let you know what they are. If you do not hear from [place], then your results are normal and you can view them through your  MyChart account , or a letter will be sent to you. Thank you again for trusting [place] with your care  [facility].

## 2020-04-24 NOTE — Progress Notes (Signed)
_0  ID: Christy Gay, female    DOB: [birth year], 44 y.o.   MRN: [id number]  Chief Complaint  Patient presents with  . Consult    Referred by Dr. Ernst Bowler for history of SOB. Had a PFT back in November 2021. SOB started in March 2021. Denies ever being on O2. Notices the increased SOB with exertion.     Referring provider: Valentina Shaggy, *  HPI:   44 year old whom we are seeing at the request of Salvatore Marvel, MD for evaluation of shortness of breath.  Notes from referring provider reviewed.  PCP notes reviewed.  Patient was a relatively well.  In March 2021 she received 1st dose of Covid vaccine via [employer].  The next day day after injection she developed rash, shortness of breath.  Treated with outpatient antihistamines, H2 blockers, steroids.  Symptoms persisted which prompted ED evaluation.  ED notes reviewed.  Recently, she developed dyspnea on exertion.  Pretty severe.  Cannot longer do things she enjoys.  Was regularly exercising, doing Zumba.  Now becomes very short of breath just walk around the house.  Worse with inclines and stairs.  Use Breo for 2 to 3 months through July.  This seemed to help some with improving her breath.  Otherwise no clear alleviating or exacerbating factors.  No timing during the day with angina better or worse.  Symptoms not changed with change in seasons.  She has had 4 chest x-rays over the course of the last several months with symptoms all personally reviewed and interpreted as clear lungs.  She been evaluated by allergist and PCP.  Also evaluated by cardiology with normal exercise stress test.  Results of this test reviewed.  She notes seasonal allergies.  She does not take everything anything standing for this.  Uses occasional Zyrtec, other over-the-counter antihistamines.  They seem to provide some relief.  Symptoms of allergies worse with seasonal changes per report.  She denies any nasal congestion, sinus pressure at this time.  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  She had a big Mac.  Shortly after she developed bright red rash that extended from her neck down to her trunk.  Maybe some lip tingling.  Was seen by her PCP and given a shot of Solu-Medrol.  Denies any other issues with eating red meat or pork.  Socially has eaten beef at [employer] without similar symptoms.   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  Tests:   FENO:  No results found for: NITRICOXIDE  PFT: PFT Results Latest Ref Rng & Units 03/12/2020  FVC-Pre L 2.86  FVC-Predicted Pre % 81  FVC-Post L 2.61  FVC-Predicted Post % 74  Pre FEV1/FVC % % 86  Post FEV1/FCV % % 90  FEV1-Pre L 2.45  FEV1-Predicted Pre % 87  FEV1-Post L 2.34  DLCO uncorrected ml/min/mmHg 26.16  DLCO UNC% % 126  DLCO corrected ml/min/mmHg 25.48  DLCO COR %Predicted % 123  DLVA Predicted % 124  TLC L 4.81  TLC % Predicted % 98  RV % Predicted %  38  Personally reviewed interpreted as normal spirometry, no significant bronchodilator response, TLC within normal limits, DLCO within normal limits slightly elevated  WALK:  No flowsheet data found.  Imaging: Serial chest x-rays personally reviewed and as per discussion of this note in EMR  Lab Results: Personally reviewed, notably eosinophils 0 CBC    Component Value Date/Time   WBC 6.9 01/14/2020 1305   RBC 4.91 01/14/2020 1305   HGB 14.3 01/14/2020 1305   HCT 45.1 01/14/2020 1305   PLT 333 01/14/2020 1305   MCV 91.9 01/14/2020 1305   MCV  86.1 02/06/2014 1804   MCH 29.1 01/14/2020 1305   MCHC 31.7 01/14/2020 1305   RDW 13.1 01/14/2020 1305   LYMPHSABS 1.2 07/12/2019 1721   MONOABS 0.2 07/12/2019 1721   EOSABS 0.0 07/12/2019 1721   BASOSABS 0.0 07/12/2019 1721    BMET    Component Value Date/Time   NA 141 01/14/2020 1305   K 3.8 01/14/2020 1305   CL 103 01/14/2020 1305   CO2 27 01/14/2020 1305   GLUCOSE 92 01/14/2020 1305   BUN 9 01/14/2020 1305   CREATININE 0.64 01/14/2020 1305   CALCIUM 9.6 01/14/2020 1305   GFRNONAA >60 01/14/2020 1305   GFRAA >60 01/14/2020 1305    BNP No results found for: BNP  ProBNP    Component Value Date/Time   PROBNP 62 02/02/2020 1608    Specialty Problems   None     Allergies  Allergen Reactions  . Polyethylene Glycol Shortness Of Breath and Itching  . Covid-19 (Subunit) Vaccine   . Keflex [Cephalexin] Rash    Immunization History  Administered Date(s) Administered  . DTaP 11/08/1973, 01/09/1974, 02/22/1975, 11/16/1978  . IPV 11/08/1973, 01/09/1974, 02/22/1975, 11/16/1978  . Influenza, Seasonal, Injecte, Preservative Fre 04/11/2013  . Influenza,inj,Quad PF,6+ Mos 02/28/2014, 03/27/2015, 03/25/2016, 03/14/2018, 03/15/2019  . MMR 11/13/1992  . PFIZER SARS-COV-2 Vaccination 07/09/2019  . Td 10/18/1988, 06/05/1997  . Tdap 08/03/2013    Past Medical History:  Diagnosis Date  . Arthritis    neck, upper back  . 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 Diabetes mellitus without complication (HCC)    gestational with first pregnancy  . Headache(784.0)    migraines  . Normal pregnancy, repeat 10/11/2013    Tobacco History: Social History   Tobacco Use  Smoking Status Never Smoker  Smokeless Tobacco Never Used   Counseling given: Not Answered   Continue to not smoke  Outpatient Encounter Medications as of 04/24/2020  Medication Sig  . albuterol (VENTOLIN HFA) 108 (90 Base) MCG/ACT inhaler INHALE 1-2 PUFFS INTO THE LUNGS EVERY 6  (SIX) HOURS AS NEEDED FOR WHEEZING OR SHORTNESS OF BREATH.  . Dapsone 5 % topical gel Apply 1 application topically 2 (two) times a week.  Marland Kitchen EPINEPHrine (EPIPEN 2-PAK) 0.3 mg/0.3 mL IJ SOAJ injection Inject 0.3 mLs (0.3 mg total) into the muscle as needed for anaphylaxis.  . hydrOXYzine (ATARAX/VISTARIL) 25 MG tablet Take 25 mg by mouth 4 (four) times daily.  Marland Kitchen liothyronine (CYTOMEL) 25 MCG tablet   . nitroGLYCERIN (NITROSTAT) 0.4 MG SL tablet Place 1 tablet (0.4 mg total) under the tongue every 5 (five) minutes as needed for chest pain.  Marland Kitchen venlafaxine XR (EFFEXOR-XR) 150 MG 24 hr capsule Take 150 mg by mouth daily with breakfast.  . [DISCONTINUED] fluticasone furoate-vilanterol (BREO ELLIPTA) 100-25 MCG/INH AEPB Inhale 1 puff into the lungs daily.  . fluticasone furoate-vilanterol (BREO ELLIPTA) 200-25 MCG/INH AEPB Inhale 1  puff into the lungs daily.  . montelukast (SINGULAIR) 10 MG tablet Take 1 tablet (10 mg total) by mouth at bedtime.   Facility-Administered Encounter Medications as of 04/24/2020  Medication  . methylPREDNISolone acetate (DEPO-MEDROL) injection 80 mg     Review of Systems  Review of Systems  No orthopnea or PND.  No weight gain.  No fever, chills.  No night sweats.  Comprehensive review of systems otherwise negative. Physical Exam  BP 126/80   Pulse 90   Temp (!) 97 F (36.1 C) (Temporal)   Ht _0  (1.549 m)   Wt 229 lb (103.9 kg)   SpO2 96% Comment: on RA  BMI 43.27 kg/m   Wt Readings from Last 5 Encounters:  04/24/20 229 lb (103.9 kg)  04/15/20 235 lb (106.6 kg)  02/12/20 234 lb 3.2 oz (106.2 kg)  01/31/20 232 lb (105.2 kg)  01/15/20 232 lb 9.6 oz (105.5 kg)    BMI Readings from Last 5 Encounters:  04/24/20 43.27 kg/m  04/15/20 44.40 kg/m  02/12/20 42.84 kg/m  01/31/20 42.43 kg/m  01/15/20 42.54 kg/m     Physical Exam General: Well-appearing, sitting up in chair Eyes: EOMI, icterus Neck: Supple, no JVP appreciated Respiratory: Clear  aspiration bilaterally, no wheeze Cardiovascular: Regular rhythm, no murmurs, no edema Abdomen/GI: Nondistended, bowel sounds present MSK: No synovitis, no joint effusion Neuro: Normal gait, no weakness Psych: Normal mood, full affect   Assessment & Plan:   Dyspnea on exertion: Likely multifactorial.  Do think element of deconditioning playing a role, less active than prior.  Weight down 10 pounds from prior to Covid vaccination and onset of symptoms which is good.  However, given her atopic symptoms, seasonal allergies, high suspicion for poorly controlled asthma contributing to her dyspnea.  Mild improvement with low-dose Breo back in the spring/early summer.  Prescribed high-dose Breo 1 puff daily, montelukast 10 mg daily for asthma.  Eos 0 recently.  Given atopic symptoms, we will pursue IgE and perennial allergen panel today.  Possible target for biologic therapy in the future if elevated.  Recent cardiac treadmill stress test normal which is reassuring.  Recent PFTs normal, also reassuring.  Consider echocardiogram in the future to round out work-up for dyspnea.     Return in about 3 months (around 07/23/2020).   Lanier Clam, MD 04/24/2020

## 2020-04-25 LAB — IGE: IgE (Immunoglobulin E), Serum: 251 kU/L — ABNORMAL HIGH (ref <=114)

## 2020-04-29 LAB — ALLERGEN PROFILE, PERENNIAL ALLERGEN IGE
Alternaria Alternata IgE: 3.57 kU/L — AB
Aspergillus Fumigatus IgE: 0.17 kU/L — AB
Aureobasidi Pullulans IgE: 0.21 kU/L — AB
Candida Albicans IgE: 1.57 kU/L — AB
Cat Dander IgE: 0.37 kU/L — AB
Chicken Feathers IgE: <0.1 kU/L
Cladosporium Herbarum IgE: 0.13 kU/L — AB
Cow Dander IgE: <0.1 kU/L
D Farinae IgE: 1.53 kU/L — AB
D Pteronyssinus IgE: 2.82 kU/L — AB
Dog Dander IgE: <0.1 kU/L
Duck Feathers IgE: <0.1 kU/L
Goose Feathers IgE: <0.1 kU/L
Mouse Urine IgE: <0.1 kU/L
Mucor Racemosus IgE: <0.1 kU/L
Penicillium Chrysogen IgE: 0.15 kU/L — AB
Phoma Betae IgE: 0.49 kU/L — AB
Setomelanomma Rostrat: 0.56 kU/L — AB
Stemphylium Herbarum IgE: 2.76 kU/L — AB

## 2020-05-16 ENCOUNTER — Ambulatory Visit (INDEPENDENT_AMBULATORY_CARE_PROVIDER_SITE_OTHER): Payer: Self-pay | Admitting: Family Medicine

## 2020-05-16 ENCOUNTER — Encounter: Payer: Self-pay | Admitting: Family Medicine

## 2020-05-16 DIAGNOSIS — N3000 Acute cystitis without hematuria: Secondary | ICD-10-CM

## 2020-05-16 MED ORDER — SULFAMETHOXAZOLE-TRIMETHOPRIM 800-160 MG PO TABS: 1.0000 | ORAL_TABLET | Freq: Two times a day (BID) | ORAL | 0 refills | Status: DC

## 2020-05-16 NOTE — Progress Notes (Signed)
Virtual Visit via telephone Note  I connected with Christy Gay on 05/16/20 at 1703 by telephone and verified that I am speaking with the correct person using two identifiers. Christy Gay is currently located at home and patient are currently with her during visit. The provider, Elige Radon Dettinger, MD is located in their office at time of visit.  Call ended at 1712  I discussed the limitations, risks, security and privacy concerns of performing an evaluation and management service by telephone and the availability of in person appointments. I also discussed with the patient that there may be a patient responsible charge related to this service. The patient expressed understanding and agreed to proceed.   History and Present Illness: Patient started yesterday having pain in her side.  She took tylenol and is still having pain. 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  She is getting worse this afternoon.  She is having urgency and frequency. She denies hematuria. She has a fever of 99.6. she denies any vaginal or bowel issues. The right flank is hurting.   No diagnosis found.  Outpatient Encounter Medications as of 05/16/2020  Medication Sig  . albuterol (VENTOLIN HFA) 108 (90 Base) MCG/ACT inhaler INHALE 1-2 PUFFS INTO THE LUNGS EVERY 6 (SIX) HOURS AS NEEDED FOR WHEEZING OR SHORTNESS OF BREATH.  . Dapsone 5 % topical gel Apply 1 application topically 2 (two) times a week.  Marland Kitchen EPINEPHrine (EPIPEN 2-PAK) 0.3 mg/0.3 mL IJ SOAJ injection Inject 0.3 mLs (0.3 mg total) into the muscle as needed for anaphylaxis.  . fluticasone furoate-vilanterol (BREO ELLIPTA) 200-25 MCG/INH AEPB Inhale 1 puff into the lungs daily.  . hydrOXYzine (ATARAX/VISTARIL) 25 MG tablet Take 25 mg by mouth 4 (four) times daily.  Marland Kitchen liothyronine (CYTOMEL) 25 MCG tablet   . montelukast (SINGULAIR) 10 MG tablet Take 1 tablet (10 mg total) by mouth at bedtime.  . nitroGLYCERIN (NITROSTAT) 0.4 MG SL tablet  Place 1 tablet (0.4 mg total) under the tongue every 5 (five) minutes as needed for chest pain.  Marland Kitchen venlafaxine XR (EFFEXOR-XR) 150 MG 24 hr capsule Take 150 mg by mouth daily with breakfast.   Facility-Administered Encounter Medications as of 05/16/2020  Medication  . methylPREDNISolone acetate (DEPO-MEDROL) injection 80 mg    Review of Systems  Constitutional: Negative for chills and fever.  Eyes: Negative for visual disturbance.  Respiratory: Negative for chest tightness and shortness of breath.   Cardiovascular: Negative for chest pain and leg swelling.  Gastrointestinal: Positive for abdominal pain.  Genitourinary: Positive for dysuria, flank pain, frequency and urgency. Negative for difficulty urinating, hematuria, vaginal bleeding, vaginal discharge and vaginal pain.  Musculoskeletal: Negative for back pain and gait problem.  Skin: Negative for rash.  Neurological: Negative for light-headedness and headaches.  Psychiatric/Behavioral: Negative for agitation and behavioral problems.  All other systems reviewed and are negative.   Observations/Objective: Patient sounds comfortable and in no acute distress.    Assessment and Plan: Problem List Items Addressed This Visit   None   Visit Diagnoses    Acute cystitis without hematuria    -  Primary   Relevant Medications   sulfamethoxazole-trimethoprim (BACTRIM DS) 800-160 MG tablet       Follow up plan: Return if symptoms worsen or fail to improve.     I discussed the assessment and treatment plan with the patient. The patient was provided an opportunity to ask questions and all were answered. The patient agreed with the plan  and demonstrated an understanding of the instructions.   The patient was advised to call back or seek an in-person evaluation if the symptoms worsen or if the condition fails to improve as anticipated.  The above assessment and management plan was discussed with the patient. The patient verbalized  understanding of and has agreed to the management plan. Patient is aware to call the clinic if symptoms persist or worsen. Patient is aware when to return to the clinic for a follow-up visit. Patient educated on when it is appropriate to go to the emergency department.    I provided 9 minutes of non-face-to-face time during this encounter.    Nils Pyle, MD

## 2020-05-17 ENCOUNTER — Encounter: Payer: Self-pay | Admitting: Family Medicine

## 2020-05-17 NOTE — Telephone Encounter (Signed)
Dr. Judeth Horn, please see mychart message sent by pt and advise:  To: [facility]OL    From: [name]    Created: 05/17/2020 2:08 PM     *-*-*This message was handled on 05/17/2020 2:51 PM by [name] P*-*-*  Dr. Judeth Horn, I was wondering what the lab results mean regarding my allergic reaction t o covid vaccine?   Please let me know at your convenience.  Hope you have a good weekend.     Test results 05/17/2020 2:

## 2020-06-05 NOTE — Telephone Encounter (Signed)
MyChart message from patient:  Christy Gay, thanks for response about labs.  What should I do about continued breathing issue and chest pain with exertion?  I take the Breo and Singulair regularly, it helps, so should I just get used to it, like I have asthma?? I'm still little confused about all of this, after having vaccine last March.    Dr. Judeth Horn please advise

## 2020-06-10 NOTE — Telephone Encounter (Signed)
Called and spoke with patient to talk to her about the message from Dr. Judeth Horn in regards to possibly starting injections for her asthma. Let her know that we can go ahead and schedule her for her follow up with him and they can discuss the options at that appointment and that paperwork could be done at that time. She expressed understanding. She is now scheduled with Dr. Judeth Horn on 2/17 at 4:30. Will route this to him as an FYI. Nothing further needed at this time.

## 2020-06-20 ENCOUNTER — Other Ambulatory Visit: Payer: Self-pay

## 2020-06-20 ENCOUNTER — Ambulatory Visit (INDEPENDENT_AMBULATORY_CARE_PROVIDER_SITE_OTHER): Payer: Self-pay | Admitting: Pulmonary Disease

## 2020-06-20 ENCOUNTER — Encounter: Payer: Self-pay | Admitting: Pulmonary Disease

## 2020-06-20 VITALS — BP 118/80 | HR 87 | Temp 97.7°F | Ht 62.0 in | Wt 240.0 lb

## 2020-06-20 DIAGNOSIS — R0600 Dyspnea, unspecified: Secondary | ICD-10-CM

## 2020-06-20 DIAGNOSIS — R0609 Other forms of dyspnea: Secondary | ICD-10-CM

## 2020-06-20 NOTE — Patient Instructions (Signed)
It is ok to stay off the Breo  Try to gradually increase your physical activity over time - for example try walking 15 minutes a day 3 times a week then increase by 5 minutes every 2 weeks.  Follow up with Dr. Judeth Horn in 3 months

## 2020-07-01 NOTE — Progress Notes (Signed)
_0  ID: Christy Gay, female    DOB: August 24, 1975, 45 y.o.   MRN: [id number]  Chief Complaint  Patient presents with  . Follow-up    Sob improved    Referring [name]: [name], DO  HPI:   45 year old whom we are seeing in follow up of DOE. Note from PCP office 05/16/20 reviewed.   She feels improved overall. DOE better. Stopped breo weeks ago. No worse. Did not find it was helping at all. Had planned to discuss role of xolair for presumably poorly controled asthma. Reviewed results of IgE elevation. However, given improvement while not using ICS/LABA, do not see role for intensifying therapy. Discussed we can re-evaluate in future.   HPI at initial visit: Patient was a relatively well.  In March 2021 she received 1st dose of Covid vaccine via [employer].  The next day day after injection she developed rash, shortness of breath.  Treated with outpatient antihistamines, H2 blockers, steroids.  Symptoms persisted which prompted ED evaluation.  ED notes reviewed.  Recently, she developed dyspnea on exertion.  Pretty severe.  Cannot longer do things she enjoys.  Was regularly exercising, doing Zumba.  Now becomes very short of breath just walk around the house.  Worse with inclines and stairs.  Use Breo for 2 to 3 months through July.  This seemed to help some with improving her breath.  Otherwise no clear alleviating or exacerbating factors.  No timing during the day with angina better or worse.  Symptoms not changed with change in seasons.  She has had 4 chest x-rays over the course of the last several months with symptoms all personally reviewed and interpreted as clear lungs.  She been evaluated by allergist and PCP.  Also evaluated by cardiology with normal exercise stress test.  Results of this test reviewed.  She notes seasonal allergies.  She does not take everything anything standing for this.  Uses occasional Zyrtec, other over-the-counter antihistamines.  They seem to provide  some relief.  Symptoms of allergies worse with seasonal changes per report.  She denies any nasal congestion, sinus pressure at this time.  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  She had a big Mac.  Shortly after she developed bright red rash that extended from her neck down to her trunk.  Maybe some lip tingling.  Was seen by her PCP and given a shot of Solu-Medrol.  Denies any other issues with eating red meat or pork.  Socially has eaten beef at [employer] without similar symptoms.   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  Tests:   FENO:  No results found for: NITRICOXIDE  PFT: PFT Results Latest Ref Rng & Units 03/12/2020  FVC-Pre L 2.86  FVC-Predicted Pre % 81  FVC-Post L 2.61  FVC-Predicted Post % 74  Pre FEV1/FVC % % 86  Post FEV1/FCV % % 90  FEV1-Pre L 2.45  FEV1-Predicted Pre % 87  FEV1-Post L 2.34  DLCO uncorrected ml/min/mmHg 26.16  DLCO UNC% % 126  DLCO corrected ml/min/mmHg 25.48  DLCO COR %  Predicted % 123  DLVA Predicted % 124  TLC L 4.81  TLC % Predicted % 98  RV % Predicted % 85  Personally reviewed interpreted as normal spirometry, no significant bronchodilator response, TLC within normal limits, DLCO within normal limits slightly elevated  WALK:  No flowsheet data found.  Imaging: Serial chest x-rays personally reviewed and as per discussion of this note in EMR  Lab Results: Personally reviewed, notably eosinophils 0 CBC    Component Value Date/Time   WBC 6.9 01/14/2020 1305    RBC 4.91 01/14/2020 1305   HGB 14.3 01/14/2020 1305   HCT 45.1 01/14/2020 1305   PLT 333 01/14/2020 1305   MCV 91.9 01/14/2020 1305   MCV 86.1 02/06/2014 1804   MCH 29.1 01/14/2020 1305   MCHC 31.7 01/14/2020 1305   RDW 13.1 01/14/2020 1305   LYMPHSABS 1.2 07/12/2019 1721   MONOABS 0.2 07/12/2019 1721   EOSABS 0.0 07/12/2019 1721   BASOSABS 0.0 07/12/2019 1721    BMET    Component Value Date/Time   NA 141 01/14/2020 1305   K 3.8 01/14/2020 1305   CL 103 01/14/2020 1305   CO2 27 01/14/2020 1305   GLUCOSE 92 01/14/2020 1305   BUN 9 01/14/2020 1305   CREATININE 0.64 01/14/2020 1305   CALCIUM 9.6 01/14/2020 1305   GFRNONAA >60 01/14/2020 1305   GFRAA >60 01/14/2020 1305    BNP No results found for: BNP  ProBNP    Component Value Date/Time   PROBNP 62 02/02/2020 1608    Specialty Problems   None     Allergies  Allergen Reactions  . Polyethylene Glycol Shortness Of Breath and Itching  . Covid-19 (Subunit) Vaccine   . Keflex [Cephalexin] Rash    Immunization History  Administered Date(s) Administered  . DTaP 11/08/1973, 01/09/1974, 02/22/1975, 11/16/1978  . IPV 11/08/1973, 01/09/1974, 02/22/1975, 11/16/1978  . Influenza, Seasonal, Injecte, Preservative Fre 04/11/2013  . Influenza,inj,Quad PF,6+ Mos 02/28/2014, 03/27/2015, 03/25/2016, 03/14/2018, 03/15/2019  . MMR 11/13/1992  . PFIZER(Purple Top)SARS-COV-2 Vaccination 07/09/2019  . Td 10/18/1988, 06/05/1997  . Tdap 08/03/2013    Past Medical History:  Diagnosis Date  . Arthritis    neck, upper back  . 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 Diabetes mellitus without complication (HCC)    gestational with first pregnancy  . Headache(784.0)    migraines  . Normal pregnancy, repeat 10/11/2013    Tobacco History: Social History   Tobacco Use  Smoking Status Never Smoker  Smokeless Tobacco Never Used   Counseling given: Not Answered   Continue to not smoke  Outpatient  Encounter Medications as of 06/20/2020  Medication Sig  . liothyronine (CYTOMEL) 25 MCG tablet   . venlafaxine XR (EFFEXOR-XR) 150 MG 24 hr capsule Take 150 mg by mouth daily with breakfast.  . albuterol (VENTOLIN HFA) 108 (90 Base) MCG/ACT inhaler INHALE 1-2 PUFFS INTO THE LUNGS EVERY 6 (SIX) HOURS AS NEEDED FOR WHEEZING OR SHORTNESS OF BREATH. (Patient not taking: Reported on 06/20/2020)  . Dapsone 5 % topical gel Apply 1 application topically 2 (two) times a week. (Patient not taking: Reported on 06/20/2020)  . EPINEPHrine (EPIPEN 2-PAK) 0.3 mg/0.3 mL IJ SOAJ injection Inject 0.3 mLs (0.3 mg total) into the muscle as needed for anaphylaxis. (Patient not taking: Reported on 06/20/2020)  . fluticasone furoate-vilanterol (BREO ELLIPTA) 200-25 MCG/INH AEPB Inhale 1 puff into the lungs daily. (Patient not taking: Reported on 06/20/2020)  . hydrOXYzine (ATARAX/VISTARIL) 25 MG tablet  Take 25 mg by mouth 4 (four) times daily. (Patient not taking: Reported on 06/20/2020)  . montelukast (SINGULAIR) 10 MG tablet Take 1 tablet (10 mg total) by mouth at bedtime. (Patient not taking: Reported on 06/20/2020)  . nitroGLYCERIN (NITROSTAT) 0.4 MG SL tablet Place 1 tablet (0.4 mg total) under the tongue every 5 (five) minutes as needed for chest pain. (Patient not taking: Reported on 06/20/2020)  . sulfamethoxazole-trimethoprim (BACTRIM DS) 800-160 MG tablet Take 1 tablet by mouth 2 (two) times daily. (Patient not taking: Reported on 06/20/2020)   Facility-Administered Encounter Medications as of 06/20/2020  Medication  . methylPREDNISolone acetate (DEPO-MEDROL) injection 80 mg     Review of Systems  Review of Systems  No orthopnea or PND.  No weight gain.  No fever, chills.  No night sweats.  Comprehensive review of systems otherwise negative. Physical Exam  BP 118/80 (BP Location: Left Arm, Cuff Size: Normal)   Pulse 87   Temp 97.7 F (36.5 C)   Ht _0  (1.575 m)   Wt 240 lb (108.9 kg)   SpO2 98%   BMI  43.90 kg/m   Wt Readings from Last 5 Encounters:  06/20/20 240 lb (108.9 kg)  04/24/20 229 lb (103.9 kg)  04/15/20 235 lb (106.6 kg)  02/12/20 234 lb 3.2 oz (106.2 kg)  01/31/20 232 lb (105.2 kg)    BMI Readings from Last 5 Encounters:  06/20/20 43.90 kg/m  04/24/20 43.27 kg/m  04/15/20 44.40 kg/m  02/12/20 42.84 kg/m  01/31/20 42.43 kg/m     Physical Exam General: Well-appearing, sitting up in chair Eyes: EOMI, icterus Respiratory: Clear aspiration bilaterally, no wheeze Cardiovascular: Regular rhythm, no murmurs, no edema MSK: No synovitis, no joint effusion Neuro: Normal gait, no weakness Psych: Normal mood, full affect   Assessment & Plan:   Dyspnea on exertion: Likely multifactorial.  Do think element of deconditioning playing a role, less active than prior.  Weight down 10 pounds from prior to Covid vaccination and onset of symptoms which is good.  However, given her atopic symptoms, seasonal allergies, high suspicion for poorly controlled asthma contributing to her dyspnea.  She did not improve on Breo. Overall improvement in setting of time and mild increase in physical activity. Recommend gradual or graduated exercice program. Ok to remain off breo. Unlikely to improve with more intensive asthma therapy despite bronchodilator response in past.    Return in about 3 months (around 09/17/2020).   Lanier Clam, MD 07/01/2020

## 2020-08-01 ENCOUNTER — Ambulatory Visit (INDEPENDENT_AMBULATORY_CARE_PROVIDER_SITE_OTHER): Payer: BC Managed Care – PPO

## 2020-08-01 ENCOUNTER — Encounter: Payer: Self-pay | Admitting: Family Medicine

## 2020-08-01 ENCOUNTER — Ambulatory Visit (INDEPENDENT_AMBULATORY_CARE_PROVIDER_SITE_OTHER): Payer: BC Managed Care – PPO | Admitting: Family Medicine

## 2020-08-01 ENCOUNTER — Other Ambulatory Visit: Payer: Self-pay

## 2020-08-01 VITALS — BP 122/78 | HR 80 | Temp 98.2°F | Ht 62.0 in | Wt 239.1 lb

## 2020-08-01 DIAGNOSIS — R109 Unspecified abdominal pain: Secondary | ICD-10-CM

## 2020-08-01 LAB — MICROSCOPIC EXAMINATION
Bacteria, UA: NONE SEEN
RBC, Urine: NONE SEEN /hpf (ref 0–2)
WBC, UA: NONE SEEN /hpf (ref 0–5)

## 2020-08-01 LAB — URINALYSIS, COMPLETE
Bilirubin, UA: NEGATIVE
Glucose, UA: NEGATIVE
Ketones, UA: NEGATIVE
Leukocytes,UA: NEGATIVE
Nitrite, UA: NEGATIVE
Protein,UA: NEGATIVE
RBC, UA: NEGATIVE
Specific Gravity, UA: >1.03 — ABNORMAL HIGH (ref 1.005–1.030)
Urobilinogen, Ur: 0.2 mg/dL (ref 0.2–1.0)
pH, UA: 5 (ref 5.0–7.5)

## 2020-08-01 NOTE — Progress Notes (Signed)
Acute Office Visit  Subjective:    Patient ID: Christy Gay, female    DOB: April 19, 1976, 45 y.o.   MRN: [id number]  Chief Complaint  Patient presents with  . Flank Pain    HPI Patient is in today for right sided back pain for 2 weeks. The pain is intermittent and is a 6-7/10. The pain only occurs before and after voiding. She hasn't taken anything for pain. Denies dysuria, frequency or urgency. Denies fever or chills, changes in vaginal discharge, denies itching, abdominal pain, nausea, or vomiting. Denies blood in her urine. Last BM was this am. Denies concern for STIs. Denies injury. She has had a UTI before but reports that this feels different. She denies history of kidney stones.   Past Medical History:  Diagnosis Date  . Arthritis    neck, upper back  . 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 Diabetes mellitus without complication (HCC)    gestational with first pregnancy  . Headache(784.0)    migraines  . Normal pregnancy, repeat 10/11/2013    Past Surgical History:  Procedure Laterality Date  . CALCANEAL OSTEOTOMY Left 10/14/2017   Procedure: Right Calcaneal Osteotomy;  Surgeon: Toni Arthurs, MD;  Location: [facility];  Service: Orthopedics;  Laterality: Left;  . CERVICAL ABLATION    . CESAREAN SECTION    . CESAREAN SECTION N/A 10/12/2013   Procedure: CESAREAN SECTION;  Surgeon: Sherian Rein, MD;  Location: [facility];  Service: Obstetrics;  Laterality: N/A;  . GASTROCNEMIUS RECESSION Right 10/14/2017   Procedure: Right Gastroc Recession;  Surgeon: Toni Arthurs, MD;  Location: [facility];  Service: Orthopedics;  Laterality: Right;  . KIDNEY SURGERY    . LIGAMENT REPAIR Right 10/14/2017   Procedure: Right Spring Ligament Repair;  Surgeon: Toni Arthurs, MD;  Location: [facility];  Service: Orthopedics;  Laterality: Right;  . TENOLYSIS Right 10/14/2017   Procedure: Right Posterior Tibial Tenolysis;   Surgeon: Toni Arthurs, MD;  Location: [facility];  Service: Orthopedics;  Laterality: Right;  . TONSILLECTOMY    . TUBAL LIGATION Bilateral 10/12/2013   Procedure: BILATERAL TUBAL LIGATION;  Surgeon: Sherian Rein, MD;  Location: [facility];  Service: Obstetrics;  Laterality: Bilateral;    Family History  Problem Relation Age of Onset  . Heart disease Mother        RHD    Social History   Socioeconomic History  . Marital status: Married    Spouse name: Not on file  . Number of children: Not on file  . Years of education: Not on file  . Highest education level: Not on file  Occupational History  . Not on file  Tobacco Use  . Smoking status: Never Smoker  . Smokeless tobacco: Never Used  Vaping Use  . Vaping Use: Never used  Substance and Sexual Activity  . Alcohol use: No  . Drug use: No  . Sexual activity: Not on file  Other Topics Concern  . 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     Social Determinants of Health   Financial Resource Strain: Not on file  Food Insecurity: Not on file  Transportation Needs: Not on file  Physical Activity: Not on file  Stress: Not on file  Social Connections: Not on file  Intimate Partner Violence: Not on file    Outpatient Medications Prior to Visit  Medication Sig Dispense Refill  . albuterol (VENTOLIN HFA) 108 (90 Base) MCG/ACT inhaler INHALE 1-2 PUFFS INTO THE LUNGS EVERY 6 (SIX) HOURS AS NEEDED FOR WHEEZING OR SHORTNESS OF BREATH. 18 each 0  . Dapsone 5 % topical gel Apply 1 application topically 2 (two) times a week.    Marland Kitchen EPINEPHrine (EPIPEN 2-PAK) 0.3 mg/0.3 mL IJ SOAJ injection Inject 0.3 mLs (0.3 mg total) into the muscle as needed for anaphylaxis. 1 each 0  . liothyronine (CYTOMEL) 25 MCG tablet     . nitroGLYCERIN (NITROSTAT) 0.4 MG SL tablet Place 1 tablet (0.4 mg total) under the tongue every 5 (five) minutes as needed for chest pain.  50 tablet 3  . venlafaxine XR (EFFEXOR-XR) 150 MG 24 hr capsule Take 150 mg by mouth daily with breakfast.    . fluticasone furoate-vilanterol (BREO ELLIPTA) 200-25 MCG/INH AEPB Inhale 1 puff into the lungs daily. 60 each 11  . hydrOXYzine (ATARAX/VISTARIL) 25 MG tablet Take 25 mg by mouth 4 (four) times daily.    . montelukast (SINGULAIR) 10 MG tablet Take 1 tablet (10 mg total) by mouth at bedtime. 30 tablet 11  . sulfamethoxazole-trimethoprim (BACTRIM DS) 800-160 MG tablet Take 1 tablet by mouth 2 (two) times daily. 20 tablet 0   Facility-Administered Medications Prior to Visit  Medication Dose Route Frequency Provider Last Rate Last Admin  . methylPREDNISolone acetate (DEPO-MEDROL) injection 80 mg  80 mg Intramuscular Once Gottschalk, [name], DO        Allergies  Allergen Reactions  . Polyethylene Glycol Shortness Of Breath and Itching  . Covid-19 (Subunit) Vaccine   . Keflex [Cephalexin] Rash    Review of Systems As per HPI.      Objective:    Physical Exam Vitals and nursing note reviewed.  Constitutional:      General: She is not in acute distress.    Appearance: She is not ill-appearing, toxic-appearing or diaphoretic.  HENT:     Head: Normocephalic and atraumatic.  Cardiovascular:     Rate and Rhythm: Normal rate and regular rhythm.     Heart sounds: Normal heart sounds. No murmur heard.   Pulmonary:     Effort: Pulmonary effort is normal. No respiratory distress.     Breath sounds: Normal breath sounds.  Abdominal:     General: Bowel sounds are normal. There is no distension.     Palpations: Abdomen is soft.     Tenderness: There is no abdominal tenderness. There is no right CVA tenderness, left CVA tenderness, guarding or rebound.  Musculoskeletal:     Thoracic back: Normal.     Lumbar back: Tenderness (Lower right muscular tenderness) present. No swelling, edema or bony tenderness. Normal range of motion. Negative right straight leg raise test.     Right  lower leg: No edema.     Left lower leg: No edema.  Neurological:     General: No focal deficit present.     Mental Status: She is alert and oriented to person, place, and time.     BP 122/78   Pulse 80   Temp 98.2 F (36.8 C) (Temporal)   Ht 5\' 2"  (1.575 m)   Wt 239 lb 2 oz (108.5 kg)   BMI 43.74 kg/m  Wt Readings from Last 3 Encounters:  08/01/20 239 lb 2 oz (108.5 kg)  06/20/20 240 lb (108.9 kg)  04/24/20 229 lb (103.9 kg)   Urine dipstick shows negative for all components.  Micro exam: 0 WBC's  per HPF, 0 RBCx, no bacteria, no yeast.   Health Maintenance Due  Topic Date Due  . Hepatitis C Screening  Never done  . HIV Screening  Never done  . COLONOSCOPY (Pts 45-92yrs Insurance coverage will need to be confirmed)  Never done  . PAP SMEAR-Modifier  01/31/2019  . COVID-19 Vaccine (2 - Pfizer 3-dose series) 07/30/2019    There are no preventive care reminders to display for this patient.   No results found for: TSH Lab Results  Component Value Date   WBC 6.9 01/14/2020   HGB 14.3 01/14/2020   HCT 45.1 01/14/2020   MCV 91.9 01/14/2020   PLT 333 01/14/2020   Lab Results  Component Value Date   NA 141 01/14/2020   K 3.8 01/14/2020   CO2 27 01/14/2020   GLUCOSE 92 01/14/2020   BUN 9 01/14/2020   CREATININE 0.64 01/14/2020   CALCIUM 9.6 01/14/2020   ANIONGAP 11 01/14/2020   No results found for: CHOL No results found for: HDL No results found for: LDLCALC No results found for: TRIG No results found for: [facility] Lab Results  Component Value Date   HGBA1C 6.0 08/07/2019       Assessment & Plan:   Raseel was seen today for flank pain.  Diagnoses and all orders for this visit:  Acute right flank pain UA normal. Believe pain to be MSK in etiology. She has flexeril at home at she will try. KUB ordered for possible kidney stone, radiology report pending. Return to office for new or worsening symptoms, or if symptoms persist.  -     Urinalysis, Complete -      DG Abd 1 View; Future   The patient indicates understanding of these issues and agrees with the plan.  Gabriel Earing, FNP

## 2020-08-01 NOTE — Patient Instructions (Signed)
Flank Pain, Adult Flank pain is pain that is located on the side of the body between the upper abdomen and the back. This area is called the flank. The pain may occur over a short period of time (acute), or it may be long-term or recurring (chronic). It may be mild or severe. Flank pain can be caused by many things, including:  Muscle soreness or injury.  Kidney stones or kidney disease.  Stress.  A disease of the spine (vertebral disk disease).  A lung infection (pneumonia).  Fluid around the lungs (pulmonary edema).  A skin rash caused by the chickenpox virus (shingles).  Tumors that affect the back of the abdomen.  Gallbladder disease. Follow these instructions at home:  Drink enough fluid to keep your urine clear or pale yellow.  Rest as told by your health care provider.  Take over-the-counter and prescription medicines only as told by your health care provider.  Keep a journal to track what has caused your flank pain and what has made it feel better.  Keep all follow-up visits as told by your health care provider. This is important.   Contact a health care provider if:  Your pain is not controlled with medicine.  You have new symptoms.  Your pain gets worse.  You have a fever.  Your symptoms last longer than 2-3 days.  You have trouble urinating or you are urinating very frequently. Get help right away if:  You have trouble breathing or you are short of breath.  Your abdomen hurts or it is swollen or red.  You have nausea or vomiting.  You feel faint or you pass out.  You have blood in your urine. Summary  Flank pain is pain that is located on the side of the body between the upper abdomen and the back.  The pain may occur over a short period of time (acute), or it may be long-term or recurring (chronic). It may be mild or severe.  Flank pain can be caused by many things.  Contact your health care provider if your symptoms get worse or they last  longer than 2-3 days. This information is not intended to replace advice given to you by your health care provider. Make sure you discuss any questions you have with your health care provider. Document Revised: 01/12/2020 Document Reviewed: 01/12/2020 Elsevier Patient Education  2021 Elsevier Inc.  

## 2020-08-23 ENCOUNTER — Telehealth: Payer: BC Managed Care – PPO

## 2020-09-06 ENCOUNTER — Ambulatory Visit: Payer: BC Managed Care – PPO | Admitting: Family Medicine

## 2020-09-06 ENCOUNTER — Other Ambulatory Visit: Payer: Self-pay

## 2020-09-06 ENCOUNTER — Encounter: Payer: Self-pay | Admitting: Family Medicine

## 2020-09-06 VITALS — BP 110/77 | HR 98 | Temp 98.4°F | Resp 20 | Ht 62.0 in | Wt 245.0 lb

## 2020-09-06 DIAGNOSIS — R0609 Other forms of dyspnea: Secondary | ICD-10-CM

## 2020-09-06 DIAGNOSIS — R5383 Other fatigue: Secondary | ICD-10-CM

## 2020-09-06 DIAGNOSIS — R739 Hyperglycemia, unspecified: Secondary | ICD-10-CM

## 2020-09-06 DIAGNOSIS — R0600 Dyspnea, unspecified: Secondary | ICD-10-CM

## 2020-09-06 LAB — BAYER DCA HB A1C WAIVED: HB A1C (BAYER DCA - WAIVED): 6.2 % (ref <7.0)

## 2020-09-06 NOTE — Progress Notes (Signed)
Subjective: CC: Shortness of breath, anxiety PCP: [name], DO QVZ:DGLOV B Kushner is a 45 y.o. female presenting to clinic today for:  1.  Shortness of breath Patient continues to experience intermittent shortness of breath that seems to be associated with exertion.  She notes that this seem to be exacerbated recently after she was sick with a GI bug.  She did not use her albuterol inhaler to see if perhaps this might be helpful.  No reports of overt chest pain but sometimes feels squeezing.  She has had cardiac and pulmonary work-up.  Previously treated with Virgel Bouquet but apparently this was discontinued because it was not especially effective.  She has been told that she has asthma and other folks have told her that this is anxiety related.    She notes that the symptoms did not seem to be provoked by anxiety.  She is taking Effexor as prescribed by her psychiatrist as well as Cytomel.  She denies having had any labs drawn prior to initiation of Cytomel.  She does feel that it has improved energy.  She has been on it since November.  Ativan was recently added in April but she has not taken it because she is worried about it being excessively sedating and she has a child at home.  No reports of loss of consciousness, edema.  She worries that blood sugar may be playing a part as she has been in the prediabetic range previously   ROS: Per HPI  Allergies  Allergen Reactions  . Polyethylene Glycol Shortness Of Breath and Itching  . Covid-19 (Subunit) Vaccine   . Keflex [Cephalexin] Rash   Past Medical History:  Diagnosis Date  . Arthritis    neck, upper back  . 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 Diabetes mellitus without complication (HCC)    gestational with first pregnancy  . Headache(784.0)    migraines  . Normal pregnancy, repeat 10/11/2013    Current Outpatient Medications:  .  Dapsone 5 % topical gel, Apply 1 application topically 2 (two) times  a week., Disp: , Rfl:  .  EPINEPHrine (EPIPEN 2-PAK) 0.3 mg/0.3 mL IJ SOAJ injection, Inject 0.3 mLs (0.3 mg total) into the muscle as needed for anaphylaxis., Disp: 1 each, Rfl: 0 .  liothyronine (CYTOMEL) 25 MCG tablet, , Disp: , Rfl:  .  LORazepam (ATIVAN) 0.5 MG tablet, Take 1 tablet by mouth 2 (two) times daily as needed., Disp: , Rfl:  .  venlafaxine XR (EFFEXOR-XR) 150 MG 24 hr capsule, Take 150 mg by mouth daily with breakfast., Disp: , Rfl:  .  nitroGLYCERIN (NITROSTAT) 0.4 MG SL tablet, Place 1 tablet (0.4 mg total) under the tongue every 5 (five) minutes as needed for chest pain. (Patient not taking: Reported on 09/06/2020), Disp: 50 tablet, Rfl: 3 Social History   Socioeconomic History  . Marital status: Married    Spouse name: Not on file  . Number of children: Not on file  . Years of education: Not on file  . Highest education level: Not on file  Occupational History  . Not on file  Tobacco Use  . Smoking status: Never Smoker  . Smokeless tobacco: Never Used  Vaping Use  . Vaping Use: Never used  Substance and Sexual Activity  . Alcohol use: No  . Drug use: No  . Sexual activity: Not on file  Other Topics Concern  . 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     Social Determinants of Health   Financial Resource Strain: Not on file  Food Insecurity: Not on file  Transportation Needs: Not on file  Physical Activity: Not on file  Stress: Not on file  Social Connections: Not on file  Intimate Partner Violence: Not on file   Family History  Problem Relation Age of Onset  . Heart disease Mother        RHD    Objective: Office vital signs reviewed. BP 110/77   Pulse 98   Temp 98.4 F (36.9 C)   Resp 20   Ht $R'5\' 2"'pW$  (1.575 m)   Wt 245 lb (111.1 kg)   SpO2 96%   BMI 44.81 kg/m   Physical Examination:  General: Awake, alert, nontoxic, No acute distress HEENT: Normal, no exophthalmos.  No  goiter Cardio: regular rate and rhythm, S1S2 heard, no murmurs appreciated Pulm: clear to auscultation bilaterally, no wheezes, rhonchi or rales; normal work of breathing on room air MSK: normal gait and station Skin: Normal temperature Neuro: No tremor  Depression screen [facility] 2/9 09/06/2020 08/01/2020 04/15/2020  Decreased Interest 1 0 0  Down, Depressed, Hopeless 1 0 0  PHQ - 2 Score 2 0 0  Altered sleeping 0 - 0  Tired, decreased energy 1 - 0  Change in appetite 0 - 0  Feeling bad or failure about yourself  1 - 0  Trouble concentrating 1 - 0  Moving slowly or fidgety/restless 1 - 0  Suicidal thoughts 0 - 0  PHQ-9 Score 6 - 0  Difficult doing work/chores Somewhat difficult - -   GAD 7 : Generalized Anxiety Score 09/06/2020 02/12/2020  Nervous, Anxious, on Edge 1 0  Control/stop worrying 1 1  Worry too much - different things 0 2  Trouble relaxing 1 2  Restless 1 1  Easily annoyed or irritable 1 0  Afraid - awful might happen 1 1  Total GAD 7 Score 6 7  Anxiety Difficulty Somewhat difficult Not difficult at all    Assessment/ Plan: 45 y.o. female   Dyspnea on exertion - Plan: ECHOCARDIOGRAM COMPLETE  Elevated serum glucose - Plan: Bayer DCA Hb A1c Waived  Fatigue, unspecified type - Plan: TSH, T4, Free, CBC, CMP14+EGFR, Vitamin Y92  Uncertain etiology of ongoing dyspnea on exertion.  Her pulmonary work-up has been essentially unremarkable.  Seen with cardiology eval.  Will obtain echocardiogram given ongoing symptoms, fatigability, etc.  Check labs to look for metabolic causes of fatigue.  Continue to follow-up with psychiatry as scheduled for depression and anxiety as I do still think that that is playing somewhat of a part.  I am not quite sure the utility of Cytomel in this patient, as from what I understand from the studies this medication is used to augment SSRIs and tricyclic antidepressants, neither of which patient is on.  She has no known diagnosis of thyroid disease  and no previous laboratory studies to support initiation.  Whilst med was ordered by her psychiatrist, I am going to check thyroid labs to ensure appropriate monitoring of this medication.  Orders Placed This Encounter  Procedures  . TSH  . T4, Free  . CBC  . CMP14+EGFR  . Vitamin B12  . Bayer DCA Hb A1c Waived  . ECHOCARDIOGRAM COMPLETE    Standing Status:   Future    Standing Expiration Date:   09/06/2021    Order Specific Question:  Where should this test be performed    Answer:   [facility]    Order Specific Question:   Perflutren DEFINITY (image enhancing agent) should be administered unless hypersensitivity or allergy exist    Answer:   Administer Perflutren    Order Specific Question:   Is a special reader required? (athlete or structural heart)    Answer:   No    Order Specific Question:   Reason for exam-Echo    Answer:   Dyspnea  R06.00   No orders of the defined types were placed in this encounter.    [name], DO [place] [phone]

## 2020-09-07 LAB — CBC
Hematocrit: 40.2 % (ref 34.0–46.6)
Hemoglobin: 13.4 g/dL (ref 11.1–15.9)
MCH: 28.5 pg (ref 26.6–33.0)
MCHC: 33.3 g/dL (ref 31.5–35.7)
MCV: 86 fL (ref 79–97)
Platelets: 317 10*3/uL (ref 150–450)
RBC: 4.7 x10E6/uL (ref 3.77–5.28)
RDW: 12.2 % (ref 11.7–15.4)
WBC: 7.8 10*3/uL (ref 3.4–10.8)

## 2020-09-07 LAB — CMP14+EGFR
ALT: 17 IU/L (ref 0–32)
AST: 14 IU/L (ref 0–40)
Albumin/Globulin Ratio: 1.5 (ref 1.2–2.2)
Albumin: 4.2 g/dL (ref 3.8–4.8)
Alkaline Phosphatase: 72 IU/L (ref 44–121)
BUN/Creatinine Ratio: 16 (ref 9–23)
BUN: 10 mg/dL (ref 6–24)
Bilirubin Total: 0.2 mg/dL (ref 0.0–1.2)
CO2: 23 mmol/L (ref 20–29)
Calcium: 9.6 mg/dL (ref 8.7–10.2)
Chloride: 103 mmol/L (ref 96–106)
Creatinine, Ser: 0.64 mg/dL (ref 0.57–1.00)
Globulin, Total: 2.8 g/dL (ref 1.5–4.5)
Glucose: 97 mg/dL (ref 65–99)
Potassium: 4.4 mmol/L (ref 3.5–5.2)
Sodium: 142 mmol/L (ref 134–144)
Total Protein: 7 g/dL (ref 6.0–8.5)
eGFR: 110 mL/min/{1.73_m2} (ref >59)

## 2020-09-07 LAB — VITAMIN B12: Vitamin B-12: 224 pg/mL — ABNORMAL LOW (ref 232–1245)

## 2020-09-07 LAB — T4, FREE: Free T4: 0.34 ng/dL — ABNORMAL LOW (ref 0.82–1.77)

## 2020-09-07 LAB — TSH: TSH: 0.772 u[IU]/mL (ref 0.450–4.500)

## 2020-09-16 ENCOUNTER — Encounter: Payer: Self-pay | Admitting: Family Medicine

## 2020-09-16 ENCOUNTER — Emergency Department (HOSPITAL_COMMUNITY): Payer: BC Managed Care – PPO

## 2020-09-16 ENCOUNTER — Other Ambulatory Visit: Payer: Self-pay

## 2020-09-16 ENCOUNTER — Encounter (HOSPITAL_COMMUNITY): Payer: Self-pay

## 2020-09-16 ENCOUNTER — Emergency Department (HOSPITAL_COMMUNITY)
Admission: EM | Admit: 2020-09-16 | Discharge: 2020-09-17 | Disposition: A | Discharge status: Home / Self Care | Payer: BC Managed Care – PPO | Attending: Emergency Medicine | Admitting: Emergency Medicine

## 2020-09-16 ENCOUNTER — Ambulatory Visit (INDEPENDENT_AMBULATORY_CARE_PROVIDER_SITE_OTHER): Payer: BC Managed Care – PPO | Admitting: Family Medicine

## 2020-09-16 VITALS — BP 110/80 | HR 89 | Temp 98.0°F | Resp 20 | Ht 62.0 in | Wt 245.0 lb

## 2020-09-16 DIAGNOSIS — E1122 Type 2 diabetes mellitus with diabetic chronic kidney disease: Secondary | ICD-10-CM | POA: Diagnosis not present

## 2020-09-16 DIAGNOSIS — N189 Chronic kidney disease, unspecified: Secondary | ICD-10-CM | POA: Diagnosis not present

## 2020-09-16 DIAGNOSIS — R079 Chest pain, unspecified: Secondary | ICD-10-CM

## 2020-09-16 DIAGNOSIS — R0789 Other chest pain: Secondary | ICD-10-CM | POA: Diagnosis present

## 2020-09-16 LAB — BASIC METABOLIC PANEL
Anion gap: 7 (ref 5–15)
BUN: 8 mg/dL (ref 6–20)
CO2: 29 mmol/L (ref 22–32)
Calcium: 9.4 mg/dL (ref 8.9–10.3)
Chloride: 104 mmol/L (ref 98–111)
Creatinine, Ser: 0.72 mg/dL (ref 0.44–1.00)
GFR, Estimated: >60 mL/min (ref >60)
Glucose, Bld: 94 mg/dL (ref 70–99)
Potassium: 4.4 mmol/L (ref 3.5–5.1)
Sodium: 140 mmol/L (ref 135–145)

## 2020-09-16 LAB — CBC
HCT: 41.3 % (ref 36.0–46.0)
Hemoglobin: 13 g/dL (ref 12.0–15.0)
MCH: 28.3 pg (ref 26.0–34.0)
MCHC: 31.5 g/dL (ref 30.0–36.0)
MCV: 90 fL (ref 80.0–100.0)
Platelets: 309 10*3/uL (ref 150–400)
RBC: 4.59 MIL/uL (ref 3.87–5.11)
RDW: 12.8 % (ref 11.5–15.5)
WBC: 7.9 10*3/uL (ref 4.0–10.5)
nRBC: 0 % (ref 0.0–0.2)

## 2020-09-16 LAB — I-STAT BETA HCG BLOOD, ED (MC, WL, AP ONLY): I-stat hCG, quantitative: <5 m[IU]/mL (ref <5)

## 2020-09-16 LAB — TROPONIN I (HIGH SENSITIVITY): Troponin I (High Sensitivity): <2 ng/L (ref <18)

## 2020-09-16 NOTE — ED Provider Notes (Signed)
Emergency Medicine Provider Triage Evaluation Note  Christy Gay , a 45 y.o. female  was evaluated in triage.  Pt complains of left sided chest pain with radiation to the back began yesterday, Advised by PCP to be seen. NO prior hx of CHF or CAD, no FAMILY HX of CAD. No prior cardiac workup.  Review of Systems  Positive: Chest pain, shortness of breath  Negative: Uri symptoms   Physical Exam  BP 129/82 (BP Location: Right Arm)   Pulse 85   Temp 97.6 F (36.4 C) (Oral)   Resp 17   SpO2 99%  Gen:   Awake, no distress   Resp:  Normal effort  MSK:   Moves extremities without difficulty  Other:  Lungs are diminished to auscultation.  Appears uncomfortable.  Medical Decision Making  Medically screening exam initiated at 7:26 PM.  Appropriate orders placed.  Christy Gay was informed that the remainder of the evaluation will be completed by another provider, this initial triage assessment does not replace that evaluation, and the importance of remaining in the ED until their evaluation is complete.  Patient here with left-sided chest pain that worsened tonight.  Advised by PCP to be seen here today.  Endorses some nausea along with pain radiating to the left arm.  No prior cardiac history, no family history of CAD.  Does have a history of asthma.   Claude Manges, PA-C 09/16/20 1927    Gilda Crease, MD 09/17/20 [phone]

## 2020-09-16 NOTE — Progress Notes (Signed)
Acute Office Visit  Subjective:    Patient ID: Christy Gay, female    DOB: 1975-12-24, 45 y.o.   MRN: [id number]  Chief Complaint  Patient presents with  . Chest Pain    Sob     HPI Patient is in today for for chest pain. This started last night. The pain is like a pressure of the left side of her chest. It is radiating to her left arm and neck. She also reports nausea, fatigue, dyspnea on exertion, and malaise. She reports she just suddenly didn't feel well well last night and felt very tired when the chest pain started. It has since be intermittent. She has been worked up a few different times and told it was anxiety related. That is why she did not seek immediate car. She reports that she has not had chest pain that radiated to her left arm before.   Past Medical History:  Diagnosis Date  . Arthritis    neck, upper back  . 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 Diabetes mellitus without complication (HCC)    gestational with first pregnancy  . Headache(784.0)    migraines  . Normal pregnancy, repeat 10/11/2013    Past Surgical History:  Procedure Laterality Date  . CALCANEAL OSTEOTOMY Left 10/14/2017   Procedure: Right Calcaneal Osteotomy;  Surgeon: Wylene Simmer, MD;  Location: [facility];  Service: Orthopedics;  Laterality: Left;  . CERVICAL ABLATION    . CESAREAN SECTION    . CESAREAN SECTION N/A 10/12/2013   Procedure: CESAREAN SECTION;  Surgeon: Janyth Contes, MD;  Location: [facility];  Service: Obstetrics;  Laterality: N/A;  . GASTROCNEMIUS RECESSION Right 10/14/2017   Procedure: Right Gastroc Recession;  Surgeon: Wylene Simmer, MD;  Location: [facility];  Service: Orthopedics;  Laterality: Right;  . KIDNEY SURGERY    . LIGAMENT REPAIR Right 10/14/2017   Procedure: Right Spring Ligament Repair;  Surgeon: Wylene Simmer, MD;  Location: [facility];  Service: Orthopedics;  Laterality: Right;  . TENOLYSIS  Right 10/14/2017   Procedure: Right Posterior Tibial Tenolysis;  Surgeon: Wylene Simmer, MD;  Location: [place];  Service: Orthopedics;  Laterality: Right;  . TONSILLECTOMY    . TUBAL LIGATION Bilateral 10/12/2013   Procedure: BILATERAL TUBAL LIGATION;  Surgeon: Janyth Contes, MD;  Location: [facility];  Service: Obstetrics;  Laterality: Bilateral;    Family History  Problem Relation Age of Onset  . Heart disease Mother        RHD    Social History   Socioeconomic History  . Marital status: Married    Spouse name: Not on file  . Number of children: Not on file  . Years of education: Not on file  . Highest education level: Not on file  Occupational History  . Not on file  Tobacco Use  . Smoking status: Never Smoker  . Smokeless tobacco: Never Used  Vaping Use  . Vaping Use: Never used  Substance and Sexual Activity  . Alcohol use: No  . Drug use: No  . Sexual activity: Not on file  Other Topics Concern  . 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     Social Determinants of Health   Financial Resource Strain: Not on file  Food Insecurity: Not on file  Transportation Needs: Not on file  Physical Activity: Not on file  Stress: Not on file  Social Connections: Not on file  Intimate Partner Violence: Not on file    Outpatient Medications Prior to Visit  Medication Sig Dispense Refill  . Dapsone 5 % topical gel Apply 1 application topically 2 (two) times a week.    Marland Kitchen liothyronine (CYTOMEL) 25 MCG tablet     . venlafaxine XR (EFFEXOR-XR) 150 MG 24 hr capsule Take 150 mg by mouth daily with breakfast.     No facility-administered medications prior to visit.    Allergies  Allergen Reactions  . Polyethylene Glycol Shortness Of Breath and Itching  . Covid-19 (Subunit) Vaccine   . Keflex [Cephalexin] Rash    Review of Systems As per HPI.     Objective:    Physical Exam Vitals  and nursing note reviewed.  Constitutional:      General: She is awake.     Appearance: She is diaphoretic.     Comments: Appears uncomfortable.  Cardiovascular:     Rate and Rhythm: Normal rate and regular rhythm.     Heart sounds: Normal heart sounds.  Pulmonary:     Effort: Tachypnea present. No accessory muscle usage or respiratory distress.     Breath sounds: Normal breath sounds.  Chest:     Chest wall: No mass, tenderness or edema.  Musculoskeletal:     Right lower leg: No edema.     Left lower leg: No edema.  Skin:    General: Skin is warm.  Neurological:     General: No focal deficit present.     Mental Status: She is alert and oriented to person, place, and time.  Psychiatric:        Behavior: Behavior normal.     BP 110/80   Pulse 89   Temp 98 F (36.7 C)   Resp 20   Ht _0  (1.575 m)   Wt 245 lb (111.1 kg)   SpO2 100%   BMI 44.81 kg/m  Wt Readings from Last 3 Encounters:  09/16/20 245 lb (111.1 kg)  09/06/20 245 lb (111.1 kg)  08/01/20 239 lb 2 oz (108.5 kg)    Health Maintenance Due  Topic Date Due  . HIV Screening  Never done  . Hepatitis C Screening  Never done  . COLONOSCOPY (Pts 45-29yr Insurance coverage will need to be confirmed)  Never done  . PAP SMEAR-Modifier  01/31/2019  . COVID-19 Vaccine (2 - Pfizer 3-dose series) 07/30/2019    There are no preventive care reminders to display for this patient.   Lab Results  Component Value Date   TSH 0.772 09/06/2020   Lab Results  Component Value Date   WBC 7.8 09/06/2020   HGB 13.4 09/06/2020   HCT 40.2 09/06/2020   MCV 86 09/06/2020   PLT 317 09/06/2020   Lab Results  Component Value Date   NA 142 09/06/2020   K 4.4 09/06/2020   CO2 23 09/06/2020   GLUCOSE 97 09/06/2020   BUN 10 09/06/2020   CREATININE 0.64 09/06/2020   BILITOT 0.2 09/06/2020   ALKPHOS 72 09/06/2020   AST 14 09/06/2020   ALT 17 09/06/2020   PROT 7.0 09/06/2020   ALBUMIN 4.2 09/06/2020   CALCIUM 9.6  09/06/2020   ANIONGAP 11 01/14/2020   EGFR 110 09/06/2020   No results found for: CHOL No results found for: HDL No results found for: LDLCALC No results found for: TRIG No results found for: [facility]  Component Value Date   HGBA1C  6.2 09/06/2020       Assessment & Plan:   Christy Gay was seen today for chest pain.  Diagnoses and all orders for this visit:  Chest pain, unspecified type Radiating to left arm and neck with nausea, diaphoresis, and fatigue since last night. No acute findings on EKG, however discussed need for further evaluation in ED to rule out MI. Patient declined EMS, she will have family drive her.  -     EKG 12-Lead  The patient indicates understanding of these issues and agrees with the plan.  Gwenlyn Perking, FNP

## 2020-09-16 NOTE — ED Triage Notes (Signed)
Pt states that she had CP and SOB that started last night, worse tonight, some nausea, pain radiates to L arm and neck

## 2020-09-16 NOTE — Patient Instructions (Signed)
Heart Attack The heart is a muscle that needs oxygen to survive. A heart attack is a condition that occurs when your heart does not get enough oxygen. When this happens, the heart muscle begins to die. This can cause permanent damage if not treated right away. A heart attack is a medical emergency. This condition may be called a myocardial infarction, or MI. It is also known as acute coronary syndrome (ACS). ACS is a term used to describe a group of conditions that affect blood flow to the heart. What are the causes? This condition may be caused by:  Atherosclerosis. This occurs when a fatty substance called plaque builds up in the arteries and blocks or reduces blood supply to the heart.  A blood clot. A blood clot can develop suddenly when plaque breaks up within an artery and blocks blood flow to the heart.  Low blood pressure.  An abnormal heartbeat (arrhythmia).  Conditions that cause a decrease of oxygen to the heart, such as anemiaorrespiratory failure.  A spasm, or severe tightening, of a blood vessel that cuts off blood flow to the heart.  Tearing of a coronary artery (spontaneous coronary artery dissection).  High blood pressure.   What increases the risk? The following factors may make you more likely to develop this condition:  Aging. The older you are, the higher your risk.  Having a personal or family history of chest pain, heart attack, stroke, or narrowing of the arteries in the legs, arms, head, or stomach (peripheral artery disease).  Being female.  Smoking.  Not getting regular exercise.  Being overweight or obese.  Having high blood pressure.  Having high cholesterol (hypercholesterolemia).  Having diabetes.  Drinking too much alcohol.  Using illegal drugs, such as cocaine or methamphetamine. What are the signs or symptoms? Symptoms of this condition may vary, depending on factors like gender and age. Symptoms may include:  Chest pain. It may feel  like: ? Crushing or squeezing. ? Tightness, pressure, fullness, or heaviness.  Pain in the arm, neck, jaw, back, or upper body.  Shortness of breath.  Heartburn or upset stomach.  Nausea.  Sudden cold sweats.  Feeling tired.  Sudden light-headedness. How is this diagnosed? This condition may be diagnosed through tests, such as:  Electrocardiogram (ECG) to measure the electrical activity of your heart.  Blood tests to check for cardiac markers. These chemicals are released by a damaged heart muscle.  A test to evaluate blood flow and heart function (coronary angiogram).  CT scan to see the heart more clearly.  A test to evaluate the pumping action of the heart (echocardiogram). How is this treated? A heart attack must be treated as soon as possible. Treatment may include:  Medicines to: ? Break up or dissolve blood clots (fibrinolytic therapy). ? Thin blood and help prevent blood clots. ? Treat blood pressure. ? Improve blood flow to the heart. ? Reduce pain. ? Reduce cholesterol.  Angioplasty and stent placement. These are procedures to widen a blocked artery and keep it open.  Coronary artery bypass graft, CABG, or open heart surgery. This enables blood to flow to the heart by going around the blocked part of the artery.  Oxygen therapy if needed.  Cardiac rehabilitation. This improves your health and well-being through exercise, education, and counseling.   Follow these instructions at home: Medicines  Take over-the-counter and prescription medicines only as told by your health care provider.  Do not take the following medicines unless your health care provider   says it is okay to take them: ? NSAIDs, such as ibuprofen. ? Supplements that contain vitamin A, vitamin E, or both. ? Hormone replacement therapy that contains estrogen with or without progestin. Lifestyle  Do not use any products that contain nicotine or tobacco, such as cigarettes, e-cigarettes,  and chewing tobacco. If you need help quitting, ask your health care provider.  Avoid secondhand smoke.  Exercise regularly. Ask your health care provider about participating in a cardiac rehabilitation program that helps you start exercising safely after a heart attack.  Eat a heart-healthy diet. Your health care provider will tell you what foods to eat.  Maintain a healthy weight.  Learn ways to manage stress.  Do not use illegal drugs.   Alcohol use  Do not drink alcohol if: ? Your health care provider tells you not to drink. ? You are pregnant, may be pregnant, or are planning to become pregnant.  If you drink alcohol: ? Limit how much you use to:  0-1 drink a day for women.  0-2 drinks a day for men. ? Be aware of how much alcohol is in your drink. In the [place] one drink equals one 12 oz bottle of beer (355 mL), one 5 oz glass of wine (148 mL), or one 1 oz glass of hard liquor (44 mL). General instructions  Work with your health care provider to manage any other conditions you have, such as high blood pressure or diabetes. These conditions affect your heart.  Get screened for depression, and seek treatment if needed.  Keep your vaccinations up to date. Get the flu vaccine every year.  Keep all follow-up visits as told by your health care provider. This is important. Contact a health care provider if:  You feel overwhelmed or sad.  You have trouble doing your daily activities. Get help right away if:  You have sudden, unexplained discomfort in your chest, arms, back, neck, jaw, or upper body.  You have shortness of breath.  You suddenly start to sweat or your skin gets clammy.  You feel nauseous or you vomit.  You have unexplained tiredness or weakness.  You suddenly feel light-headed or dizzy.  You notice your heart starts to beat fast or feels like it is skipping beats.  You have blood pressure that is higher than 180/120. These symptoms may represent a  serious problem that is an emergency. Do not wait to see if the symptoms will go away. Get medical help right away. Call your local emergency services (911 in the [place] Do not drive yourself to the hospital. Summary  A heart attack, also called myocardial infarction, is a condition that occurs when your heart does not get enough oxygen. This is caused by anything that blocks or reduces blood flow to the heart.  Treatment is a combination of medicines and surgeries, if needed, to open the blocked arteries and restore blood flow to the heart.  A heart attack is an emergency. Get help right away if you have sudden discomfort in your chest, arms, back, neck, jaw, or upper body. Seek help if you feel nauseous, you vomit, or you feel light-headed or dizzy. This information is not intended to replace advice given to you by your health care provider. Make sure you discuss any questions you have with your health care provider. Document Revised: 07/28/2018 Document Reviewed: 08/01/2018 Elsevier Patient Education  2021 Elsevier Inc.  

## 2020-09-17 ENCOUNTER — Other Ambulatory Visit: Payer: Self-pay | Admitting: Family Medicine

## 2020-09-17 ENCOUNTER — Telehealth: Payer: Self-pay | Admitting: *Deleted

## 2020-09-17 DIAGNOSIS — R0600 Dyspnea, unspecified: Secondary | ICD-10-CM

## 2020-09-17 DIAGNOSIS — R0609 Other forms of dyspnea: Secondary | ICD-10-CM

## 2020-09-17 DIAGNOSIS — R0789 Other chest pain: Secondary | ICD-10-CM

## 2020-09-17 LAB — TROPONIN I (HIGH SENSITIVITY): Troponin I (High Sensitivity): 3 ng/L (ref <18)

## 2020-09-17 NOTE — Discharge Instructions (Signed)
Take a low-dose aspirin daily.  Schedule follow-up with cardiology.   If you have worsening symptoms return to ER.

## 2020-09-17 NOTE — Progress Notes (Signed)
Can we check the echo that was ordered 09/06/20?  I don't see it scheduled.

## 2020-09-17 NOTE — Telephone Encounter (Signed)
Pt saw Tiffany yesterday for chest pain. She did go to ED. She said they didn't really help her and told her to see a cardiologist.  She said Dr. Reece Agar was going to get her an echo awhile back?  Patient's chest is hurting her so badly and very SOB. She needs some direction.
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
## 2020-09-17 NOTE — ED Notes (Signed)
Medications follow up appts reviewed w/ pt. Denies questions or concerns @ this time. Education on s/s of worsening and when to return. Evaluated for CP workup WNL aware cardiology follow up and to start taking aspirin 81mg  daily.. Left w/ even and steady gait. NAD noted. PIV removed and VSS.

## 2020-09-17 NOTE — Telephone Encounter (Signed)
I've cc'd Courtney.  Echo order in place but I don't see her on a schedule.

## 2020-09-18 ENCOUNTER — Telehealth: Payer: Self-pay

## 2020-09-18 ENCOUNTER — Other Ambulatory Visit: Payer: Self-pay

## 2020-09-18 ENCOUNTER — Ambulatory Visit (HOSPITAL_COMMUNITY)
Admission: RE | Admit: 2020-09-18 | Discharge: 2020-09-18 | Disposition: A | Discharge status: Home / Self Care | Payer: BC Managed Care – PPO | Source: Ambulatory Visit | Attending: Family Medicine | Admitting: Family Medicine

## 2020-09-18 DIAGNOSIS — R0609 Other forms of dyspnea: Secondary | ICD-10-CM | POA: Diagnosis not present

## 2020-09-18 DIAGNOSIS — R0600 Dyspnea, unspecified: Secondary | ICD-10-CM | POA: Insufficient documentation

## 2020-09-18 LAB — ECHOCARDIOGRAM COMPLETE
AR max vel: 1.92 cm2
AV Area VTI: 1.98 cm2
AV Area mean vel: 1.85 cm2
AV Mean grad: 5.1 mmHg
AV Peak grad: 10 mmHg
Ao pk vel: 1.58 m/s
Area-P 1/2: 3.65 cm2
S' Lateral: 3.1 cm

## 2020-09-18 NOTE — Progress Notes (Signed)
*  PRELIMINARY RESULTS* Echocardiogram 2D Echocardiogram has been performed.  Stacey Drain 09/18/2020, 11:21 AM

## 2020-09-19 NOTE — Telephone Encounter (Signed)
Left message to call back  

## 2020-09-19 NOTE — Telephone Encounter (Signed)
Appointment scheduled for first B12 shot on 09/23/20 at 4:00 pm
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
## 2020-09-20 ENCOUNTER — Other Ambulatory Visit: Payer: Self-pay

## 2020-09-20 ENCOUNTER — Other Ambulatory Visit: Payer: Self-pay | Admitting: Cardiology

## 2020-09-20 ENCOUNTER — Encounter: Payer: Self-pay | Admitting: Cardiology

## 2020-09-20 ENCOUNTER — Ambulatory Visit: Payer: BC Managed Care – PPO | Admitting: Cardiology

## 2020-09-20 VITALS — BP 120/82 | HR 82 | Ht 62.0 in | Wt 247.4 lb

## 2020-09-20 DIAGNOSIS — R072 Precordial pain: Secondary | ICD-10-CM | POA: Diagnosis not present

## 2020-09-20 DIAGNOSIS — R55 Syncope and collapse: Secondary | ICD-10-CM

## 2020-09-20 NOTE — Patient Instructions (Signed)
Medication Instructions:  Your physician recommends that you continue on your current medications as directed. Please refer to the Current Medication list given to you today.   *If you need a refill on your cardiac medications before your next appointment, please call your pharmacy*  Lab Work: SED RATE/D-DIMER/CRP TODAY   If you have labs (blood work) drawn today and your tests are completely normal, you will receive your results only by: Marland Kitchen MyChart Message (if you have MyChart) OR . A paper copy in the mail If you have any lab test that is abnormal or we need to change your treatment, we will call you to review the results.   Testing/Procedures: 2 WEEK MONITOR   Follow-Up: At [facility], you and your health needs are our priority.  As part of our continuing mission to provide you with exceptional heart care, we have created designated Provider Care Teams.  These Care Teams include your primary Cardiologist (physician) and Advanced Practice Providers (APPs -  Physician Assistants and Nurse Practitioners) who all work together to provide you with the care you need, when you need it.  We recommend signing up for the patient portal called "MyChart".  Sign up information is provided on this After Visit Summary.  MyChart is used to connect with patients for Virtual Visits (Telemedicine).  Patients are able to view lab/test results, encounter notes, upcoming appointments, etc.  Non-urgent messages can be sent to your provider as well.   To learn more about what you can do with MyChart, go to [email or web address].    Your next appointment:   AFTER TEST RESULTS COMPLETED   The format for your next appointment:   In Person  Provider:   You may see DR [facility] or one of the following Advanced Practice Providers on your designated Care Team:    Theodore Demark, PA-C  Joni Reining, DNP, ANP  Other Instructions  ZIO XT- Long Term Monitor Instructions   Your physician has requested  you wear a ZIO patch monitor for _14__ days.  This is a single patch monitor.   [employer] supplies one patch monitor per enrollment. Additional stickers are not available. Please do not apply patch if you will be having a Nuclear Stress Test, Echocardiogram, Cardiac CT, MRI, or Chest Xray during the period you would be wearing the monitor. The patch cannot be worn during these tests. You cannot remove and re-apply the ZIO XT patch monitor.  Your ZIO patch monitor will be sent Fed Ex from [employer] directly to your home address. It may take 3-5 days to receive your monitor after you have been enrolled.  Once you have received your monitor, please review the enclosed instructions. Your monitor has already been registered assigning a specific monitor serial # to you.  Billing and Patient Assistance Program Information   We have supplied [employer] with any of your insurance information on file for billing purposes. [employer] offers a sliding scale Patient Assistance Program for patients that do not have insurance, or whose insurance does not completely cover the cost of the ZIO monitor.   You must apply for the Patient Assistance Program to qualify for this discounted rate.     To apply, please call [employer] at [phone], select option 4, then select option 2, and ask to apply for Patient Assistance Program.  Meredeth Ide will ask your household income, and how many people are in your household.  They will quote your out-of-pocket cost based on that information.  [employer] will also be able  to set up a 40-month, interest-free payment plan if needed.  Applying the monitor   Shave hair from upper left chest.  Hold abrader disc by orange tab. Rub abrader in 40 strokes over the upper left chest as indicated in your monitor instructions.  Clean area with 4 enclosed alcohol pads. Let dry.  Apply patch as indicated in monitor instructions. Patch will be placed under collarbone on left side of chest with arrow  pointing upward.  Rub patch adhesive wings for 2 minutes. Remove white label marked "1". Remove the white label marked "2". Rub patch adhesive wings for 2 additional minutes.  While looking in a mirror, press and release button in center of patch. A small green light will flash 3-4 times. This will be your only indicator that the monitor has been turned on. ?  Do not shower for the first 24 hours. You may shower after the first 24 hours.  Press the button if you feel a symptom. You will hear a small click. Record Date, Time and Symptom in the Patient Logbook.  When you are ready to remove the patch, follow instructions on the last 2 pages of the Patient Logbook. Stick patch monitor onto the last page of Patient Logbook.  Place Patient Logbook in the blue and white box.  Use locking tab on box and tape box closed securely.  The blue and white box has prepaid postage on it. Please place it in the mailbox as soon as possible. Your physician should have your test results approximately 7 days after the monitor has been mailed back to [facility].  Call [facility] Customer Care at [phone] if you have questions regarding your ZIO XT patch monitor. Call them immediately if you see an orange light blinking on your monitor.  If your monitor falls off in less than 4 days, contact our Monitor department at [phone]. ?If your monitor becomes loose or falls off after 4 days call [employer] at [phone] for suggestions on securing your monitor.?

## 2020-09-21 LAB — SEDIMENTATION RATE: Sed Rate: 5 mm/hr (ref 0–32)

## 2020-09-21 LAB — D-DIMER, QUANTITATIVE: D-DIMER: <0.2 mg/L FEU (ref 0.00–0.49)

## 2020-09-21 LAB — C-REACTIVE PROTEIN: CRP: 6 mg/L (ref 0–10)

## 2020-09-23 ENCOUNTER — Other Ambulatory Visit: Payer: Self-pay

## 2020-09-23 ENCOUNTER — Ambulatory Visit (INDEPENDENT_AMBULATORY_CARE_PROVIDER_SITE_OTHER): Payer: BC Managed Care – PPO

## 2020-09-23 DIAGNOSIS — R072 Precordial pain: Secondary | ICD-10-CM

## 2020-09-23 DIAGNOSIS — R55 Syncope and collapse: Secondary | ICD-10-CM

## 2020-09-23 DIAGNOSIS — E538 Deficiency of other specified B group vitamins: Secondary | ICD-10-CM

## 2020-09-23 DIAGNOSIS — R5383 Other fatigue: Secondary | ICD-10-CM

## 2020-09-23 MED ORDER — CYANOCOBALAMIN 1000 MCG/ML IJ SOLN
1000.0000 ug | Freq: Once | INTRAMUSCULAR | Status: AC
  Administered 2020-09-23: 1000 ug via INTRAMUSCULAR

## 2020-09-23 NOTE — Progress Notes (Signed)
Cyanocobalamin injection given to left deltoid.  Patient tolerated well. 

## 2020-09-23 NOTE — Progress Notes (Unsigned)
Patient enrolled for [employer] to ship a 14 day ZIO XT monitor to her home. 

## 2020-09-24 ENCOUNTER — Other Ambulatory Visit: Payer: Self-pay | Admitting: Family Medicine

## 2020-09-24 DIAGNOSIS — E039 Hypothyroidism, unspecified: Secondary | ICD-10-CM

## 2020-09-24 LAB — THYROID PANEL WITH TSH
Free Thyroxine Index: 0.7 — ABNORMAL LOW (ref 1.2–4.9)
T3 Uptake Ratio: 22 % — ABNORMAL LOW (ref 24–39)
T4, Total: 3.4 ug/dL — ABNORMAL LOW (ref 4.5–12.0)
TSH: 3.34 u[IU]/mL (ref 0.450–4.500)

## 2020-09-24 MED ORDER — LEVOTHYROXINE SODIUM 50 MCG PO TABS: 50.0000 ug | ORAL_TABLET | Freq: Every day | ORAL | 1 refills | Status: DC

## 2020-09-26 ENCOUNTER — Ambulatory Visit: Payer: BC Managed Care – PPO | Admitting: Family Medicine

## 2020-10-01 DIAGNOSIS — R55 Syncope and collapse: Secondary | ICD-10-CM

## 2020-10-01 DIAGNOSIS — R072 Precordial pain: Secondary | ICD-10-CM

## 2020-10-02 ENCOUNTER — Other Ambulatory Visit: Payer: Self-pay

## 2020-10-02 ENCOUNTER — Ambulatory Visit (INDEPENDENT_AMBULATORY_CARE_PROVIDER_SITE_OTHER): Payer: BC Managed Care – PPO

## 2020-10-02 DIAGNOSIS — E538 Deficiency of other specified B group vitamins: Secondary | ICD-10-CM

## 2020-10-02 MED ORDER — CYANOCOBALAMIN 1000 MCG/ML IJ SOLN
1000.0000 ug | Freq: Once | INTRAMUSCULAR | Status: AC
Start: 2020-10-02 — End: 2020-10-02
  Administered 2020-10-02: 1000 ug via INTRAMUSCULAR

## 2020-10-02 NOTE — Progress Notes (Signed)
Cyanocobalamin injection given to right deltoid.  Patient tolerated well. 

## 2020-10-10 ENCOUNTER — Ambulatory Visit (INDEPENDENT_AMBULATORY_CARE_PROVIDER_SITE_OTHER): Payer: BC Managed Care – PPO

## 2020-10-10 ENCOUNTER — Other Ambulatory Visit: Payer: Self-pay

## 2020-10-10 DIAGNOSIS — E538 Deficiency of other specified B group vitamins: Secondary | ICD-10-CM | POA: Diagnosis not present

## 2020-10-10 MED ORDER — CYANOCOBALAMIN 1000 MCG/ML IJ SOLN
1000.0000 ug | Freq: Once | INTRAMUSCULAR | Status: AC
  Administered 2020-10-10: 1000 ug via INTRAMUSCULAR

## 2020-10-10 NOTE — Progress Notes (Signed)
B12 injection given and patient tolerated well.  

## 2020-10-11 ENCOUNTER — Telehealth: Payer: Self-pay | Admitting: Cardiology

## 2020-10-11 NOTE — Telephone Encounter (Signed)
Pt c/o of Chest Pain: STAT if CP now or developed within 24 hours  1. Are you having CP right now?  Yes  2. Are you experiencing any other symptoms (ex. SOB, nausea, vomiting, sweating)? Weakness, SOB, shakiness   3. How long have you been experiencing CP? Since last night   4. Is your CP continuous or coming and going?  continuous  5. Have you taken Nitroglycerin?  Patient states she has nitro, but has not taken any ?   Pt c/o Shortness Of Breath: STAT if SOB developed within the last 24 hours or pt is noticeably SOB on the phone  1. Are you currently SOB (can you hear that pt is SOB on the phone)?  No   2. How long have you been experiencing SOB?  Since last night   3. Are you SOB when sitting or when up moving around? Both   4. Are you currently experiencing any other symptoms?  Weakness and shaking, states she feels like she can pass out
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
## 2020-10-17 ENCOUNTER — Other Ambulatory Visit: Payer: Self-pay

## 2020-10-17 ENCOUNTER — Ambulatory Visit (INDEPENDENT_AMBULATORY_CARE_PROVIDER_SITE_OTHER): Payer: BC Managed Care – PPO | Admitting: Family Medicine

## 2020-10-17 DIAGNOSIS — E538 Deficiency of other specified B group vitamins: Secondary | ICD-10-CM

## 2020-10-17 MED ORDER — CYANOCOBALAMIN 1000 MCG/ML IJ SOLN
1000.0000 ug | Freq: Once | INTRAMUSCULAR | Status: AC
  Administered 2020-10-17: 1000 ug via INTRAMUSCULAR

## 2020-10-30 DIAGNOSIS — R42 Dizziness and giddiness: Secondary | ICD-10-CM | POA: Insufficient documentation

## 2020-10-30 DIAGNOSIS — R0602 Shortness of breath: Secondary | ICD-10-CM | POA: Insufficient documentation

## 2020-10-30 NOTE — Progress Notes (Signed)
Cardiology Office Note   Date:  11/01/2020   ID:  ELLYN RUBIANO, DOB 1976/03/14, MRN [id number]  PCP:  [name], DO  Cardiologist:   Rollene Rotunda, MD Referring:  [name], DO  Chief Complaint  Patient presents with   Chest Pain       History of Present Illness: Christy Gay is a 45 y.o. female who is referred by [name], DO for evaluation of chest pain.  She was in the ED a couple of days before for this.   I reviewed these records for this visit.   There was no evidence of ischemia.  Enzymes and EKG were negative.   I saw her in 2021 and she had a negative POET (Plain Old Exercise Treadmill), BNP and a normal echo.     Since I last saw her when she had this test she has had more chest discomfort.  She had a severe episode when she was at a graduation.  She is a Runner, broadcasting/film/video.  There was a cardiac arrest suffered by one of the audience.  This was very stressful.  During that episode she had some chest discomfort but she did not present to the emergency room until the next day.  She actually went to [facility] and I was able to see that an EKG was unremarkable.  Chest x-ray was unremarkable.  Enzymes x1 was normal.  She has continued to have chest discomfort.  This happens when she does activity such as climbing stairs.  She will get short of breath.  She gets dizzy and tired.  She will have some midsternal chest discomfort.  This will last for several minutes until she stops what she is doing.  She calls it moderate in intensity.  It interestingly does not happen with activities that she is doing like trying to walk or swim or do Zumba.  She is not having any resting shortness of breath, PND or orthopnea.  She has not had any new palpitations aside from these events.  She is not had any presyncope or syncope.    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   Diabetes mellitus without complication (HCC)    gestational with first pregnancy   Headache(784.0)    migraines   Normal pregnancy, repeat 10/11/2013    Past Surgical History:  Procedure Laterality Date   CALCANEAL OSTEOTOMY Left 10/14/2017   Procedure: Right Calcaneal Osteotomy;  Surgeon: Toni Arthurs, MD;  Location: [facility];  Service: Orthopedics;  Laterality: Left;   CERVICAL ABLATION     CESAREAN SECTION     CESAREAN SECTION N/A 10/12/2013   Procedure: CESAREAN SECTION;  Surgeon: Sherian Rein, MD;  Location: [facility];  Service: Obstetrics;  Laterality: N/A;   GASTROCNEMIUS RECESSION Right 10/14/2017   Procedure: Right Gastroc Recession;  Surgeon: Toni Arthurs, MD;  Location: [facility];  Service: Orthopedics;  Laterality: Right;   KIDNEY SURGERY     LIGAMENT REPAIR Right 10/14/2017   Procedure: Right Spring Ligament Repair;  Surgeon: Toni Arthurs, MD;  Location: [facility];  Service: Orthopedics;  Laterality: Right;   TENOLYSIS Right 10/14/2017   Procedure: Right Posterior Tibial Tenolysis;  Surgeon: Toni Arthurs, MD;  Location: [facility];  Service: Orthopedics;  Laterality: Right;   TONSILLECTOMY  TUBAL LIGATION Bilateral 10/12/2013   Procedure: BILATERAL TUBAL LIGATION;  Surgeon: Sherian Rein, MD;  Location: [facility];  Service: Obstetrics;  Laterality: Bilateral;     Current Outpatient Medications  Medication Sig Dispense Refill   Dapsone 5 % topical gel Apply 1 application topically 2 (two) times a week.     levothyroxine (SYNTHROID) 50 MCG tablet Take 1 tablet (50 mcg total) by mouth daily. Recheck labs in 1 month 30 tablet 1   metoprolol tartrate (LOPRESSOR) 50 MG tablet Take 2 hours prior to procedure 1 tablet 0   nitroGLYCERIN (NITROSTAT) 0.4 MG SL tablet Place under the tongue as needed.     venlafaxine XR (EFFEXOR-XR) 150 MG 24 hr capsule Take 150 mg by mouth daily with breakfast.      No current facility-administered medications for this visit.    Allergies:   Polyethylene glycol, Covid-19 (subunit) vaccine, and Keflex [cephalexin]    ROS:  Please see the history of present illness.   Otherwise, review of systems are positive for none.   All other systems are reviewed and negative.    PHYSICAL EXAM: VS:  BP 104/74 (BP Location: Left Arm, Patient Position: Sitting, Cuff Size: Large)   Pulse 79   Ht 5\' 2"  (1.575 m)   Wt 249 lb (112.9 kg)   SpO2 97%   BMI 45.54 kg/m  , BMI Body mass index is 45.54 kg/m. GENERAL:  Well appearing NECK:  No jugular venous distention, waveform within normal limits, carotid upstroke brisk and symmetric, no bruits, no thyromegaly LUNGS:  Clear to auscultation bilaterally CHEST:  Unremarkable HEART:  PMI not displaced or sustained,S1 and S2 within normal limits, no S3, no S4, no clicks, no rubs, no murmurs ABD:  Flat, positive bowel sounds normal in frequency in pitch, no bruits, no rebound, no guarding, no midline pulsatile mass, no hepatomegaly, no splenomegaly EXT:  2 plus pulses throughout, no edema, no cyanosis no clubbing   EKG:  EKG is not ordered today.   Recent Labs: 02/02/2020: NT-Pro BNP 62 09/06/2020: ALT 17 09/16/2020: BUN 8; Creatinine, Ser 0.72; Hemoglobin 13.0; Platelets 309; Potassium 4.4; Sodium 140 09/23/2020: TSH 3.340    Lipid Panel No results found for: CHOL, TRIG, HDL, CHOLHDL, VLDL, LDLCALC, LDLDIRECT    Wt Readings from Last 3 Encounters:  11/01/20 249 lb (112.9 kg)  09/20/20 247 lb 6.4 oz (112.2 kg)  09/16/20 245 lb (111.1 kg)      Other studies Reviewed: Additional studies/ records that were reviewed today include: Lexiscan Myoview. Review of the above records demonstrates:  Please see elsewhere in the note.     ASSESSMENT AND PLAN:  CHEST PAIN:     Given this chest discomfort persistent with activity I need to more definitively exclude obstructive coronary disease.  Therefore, she will have a  coronary CTA.   SOB:    BNP was normal.  Echo was normal.  She has had pulmonary function testing and work-up and this is unremarkable.  He might end up being weight and deconditioning but I will exclude obstructive coronary disease as described.   PRESYNCOPE: She has had no syncope recently.  An event monitor was unremarkable.  No further work-up for this is suggested.    Current medicines are reviewed at length with the patient today.  The patient does not have concerns regarding medicines.  The following changes have been made:  None  Labs/ tests ordered today include: None  Orders Placed This Encounter  Procedures   CT  CORONARY MORPH W/CTA COR W/SCORE W/CA W/CM &/OR WO/CM   Basic metabolic panel      Disposition:   FU with me based on the results of the above.    Signed, Rollene Rotunda, MD  11/01/2020 11:24 AM    [facility]

## 2020-11-01 ENCOUNTER — Other Ambulatory Visit: Payer: BC Managed Care – PPO

## 2020-11-01 ENCOUNTER — Ambulatory Visit: Payer: BC Managed Care – PPO | Admitting: Cardiology

## 2020-11-01 ENCOUNTER — Other Ambulatory Visit: Payer: Self-pay

## 2020-11-01 ENCOUNTER — Other Ambulatory Visit: Payer: Self-pay | Admitting: Family Medicine

## 2020-11-01 ENCOUNTER — Encounter: Payer: Self-pay | Admitting: Cardiology

## 2020-11-01 VITALS — BP 104/74 | HR 79 | Ht 62.0 in | Wt 249.0 lb

## 2020-11-01 DIAGNOSIS — R0602 Shortness of breath: Secondary | ICD-10-CM

## 2020-11-01 DIAGNOSIS — E039 Hypothyroidism, unspecified: Secondary | ICD-10-CM

## 2020-11-01 DIAGNOSIS — R072 Precordial pain: Secondary | ICD-10-CM

## 2020-11-01 DIAGNOSIS — Z01818 Encounter for other preprocedural examination: Secondary | ICD-10-CM

## 2020-11-01 DIAGNOSIS — R42 Dizziness and giddiness: Secondary | ICD-10-CM | POA: Diagnosis not present

## 2020-11-01 DIAGNOSIS — E538 Deficiency of other specified B group vitamins: Secondary | ICD-10-CM

## 2020-11-01 MED ORDER — METOPROLOL TARTRATE 50 MG PO TABS: ORAL_TABLET | ORAL | 0 refills | Status: DC

## 2020-11-01 NOTE — Patient Instructions (Signed)
Medication Instructions:  TAKE Metoprolol Tartrate (Lopressor) 50 mg 2 hours prior to Coronary CTA *If you need a refill on your cardiac medications before your next appointment, please call your pharmacy*  Lab Work: Your physician recommends that you return for lab work 1 week prior to Coronary CTA:  BMET   If you have labs (blood work) drawn today and your tests are completely normal, you will receive your results only by: MyChart Message (if you have MyChart) OR A paper copy in the mail If you have any lab test that is abnormal or we need to change your treatment, we will call you to review the results.  Testing/Procedures: Your physician has requested that you have cardiac CT. Cardiac computed tomography (CT) is a painless test that uses an x-ray machine to take clear, detailed pictures of your heart. For further information please visit [email or web address] Please follow instruction sheet as given.  Follow-Up: At [facility], you and your health needs are our priority.  As part of our continuing mission to provide you with exceptional heart care, we have created designated Provider Care Teams.  These Care Teams include your primary Cardiologist (physician) and Advanced Practice Providers (APPs -  Physician Assistants and Nurse Practitioners) who all work together to provide you with the care you need, when you need it.  Your next appointment:   As needed if symptoms worsen, fail to improve or new symptoms develop  The format for your next appointment:   In Person  Provider:   You may see Rollene Rotunda, MD or one of the following Advanced Practice Providers on your designated Care Team:   Theodore Demark, PA-C Joni Reining, DNP, ANP  Other Instructions      Your cardiac CT will be scheduled at one of the below locations:   [facility] [address] [place], [place] [address] [phone]  OR  [facility] [address] [place], [place] [address] [phone]  If scheduled at [facility], please arrive at the [facility] main entrance (entrance A) of [facility] 30 minutes prior to test start time. Proceed to the [facility] Radiology Department (first floor) to check-in and test prep.  If scheduled at [facility] please arrive 15 mins early for check-in and test prep.  Please follow these instructions carefully (unless otherwise directed):  Hold all erectile dysfunction medications at least 3 days (72 hrs) prior to test.  On the Night Before the Test: Be sure to Drink plenty of water. Do not consume any caffeinated/decaffeinated beverages or chocolate 12 hours prior to your test. Do not take any antihistamines 12 hours prior to your test. If the patient has contrast allergy: Patient will need a prescription for Prednisone and very clear instructions (as follows): Prednisone 50 mg - take 13 hours prior to test Take another Prednisone 50 mg 7 hours prior to test Take another Prednisone 50 mg 1 hour prior to test Take Benadryl 50 mg 1 hour prior to test Patient must complete all four doses of above prophylactic medications. Patient will need a ride after test due to Benadryl.  On the Day of the Test: Drink plenty of water until 1 hour prior to the test. Do not eat any food 4 hours prior to the test. You may take your regular medications prior to the test.  Take metoprolol (Lopressor) two hours prior to test. HOLD Furosemide/Hydrochlorothiazide morning of the test. FEMALES- please wear underwire-free  bra if available   *For Clinical Staff only. Please instruct patient the following:* Heart Rate Medication Recommendations for Cardiac CT  Resting HR < 50 bpm  No medication  Resting HR 50-60 bpm and BP >110/50 mmHG   Consider Metoprolol tartrate 25 mg PO 90-120 min prior to scan  Resting HR 60-65 bpm and BP >110/50 mmHG   Metoprolol tartrate 50 mg PO 90-120 minutes prior to scan   Resting HR > 65 bpm and BP >110/50 mmHG  Metoprolol tartrate 100 mg PO 90-120 minutes prior to scan  Consider Ivabradine 10-15 mg PO or a calcium channel blocker for resting HR >60 bpm and contraindication to metoprolol tartrate  Consider Ivabradine 10-15 mg PO in combination with metoprolol tartrate for HR >80 bpm         After the Test: Drink plenty of water. After receiving IV contrast, you may experience a mild flushed feeling. This is normal. On occasion, you may experience a mild rash up to 24 hours after the test. This is not dangerous. If this occurs, you can take Benadryl 25 mg and increase your fluid intake. If you experience trouble breathing, this can be serious. If it is severe call 911 IMMEDIATELY. If it is mild, please call our office. If you take any of these medications: Glipizide/Metformin, Avandament, Glucavance, please do not take 48 hours after completing test unless otherwise instructed.   Once we have confirmed authorization from your insurance company, we will call you to set up a date and time for your test. Based on how quickly your insurance processes prior authorizations requests, please allow up to 4 weeks to be contacted for scheduling your Cardiac CT appointment. Be advised that routine Cardiac CT appointments could be scheduled as many as 8 weeks after your provider has ordered it.  For non-scheduling related questions, please contact the cardiac imaging nurse navigator should you have any questions/concerns: Rockwell Alexandria, Cardiac Imaging Nurse Navigator Larey Brick, Cardiac Imaging Nurse Navigator [facility] Direct Office Dial: [phone]   For scheduling needs, including cancellations and rescheduling, please call [place], [phone].

## 2020-11-02 LAB — TSH: TSH: 0.617 u[IU]/mL (ref 0.450–4.500)

## 2020-11-02 LAB — T4, FREE: Free T4: 1.04 ng/dL (ref 0.82–1.77)

## 2020-11-02 LAB — VITAMIN B12: Vitamin B-12: 436 pg/mL (ref 232–1245)

## 2020-11-08 ENCOUNTER — Telehealth (HOSPITAL_COMMUNITY): Payer: Self-pay | Admitting: Emergency Medicine

## 2020-11-08 NOTE — Telephone Encounter (Signed)
Attempted to call patient regarding upcoming cardiac CT appointment. °Left message on voicemail with name and callback number °Koral Thaden RN Navigator Cardiac Imaging °[facility] °[phone] Office °[phone] Cell ° °

## 2020-11-11 ENCOUNTER — Other Ambulatory Visit: Payer: BC Managed Care – PPO

## 2020-11-11 ENCOUNTER — Telehealth (HOSPITAL_COMMUNITY): Payer: Self-pay | Admitting: Emergency Medicine

## 2020-11-11 ENCOUNTER — Other Ambulatory Visit: Payer: Self-pay

## 2020-11-11 NOTE — Telephone Encounter (Signed)
Pt returning phone call regarding upcoming cardiac imaging study; pt verbalizes understanding of appt date/time, parking situation and where to check in, pre-test NPO status and medications ordered, and verified current allergies; name and call back number provided for further questions should they arise Rockwell Alexandria RN Navigator Cardiac Imaging [facility] [phone] office [phone] cell  Denies iv issues  Denies claustro 50mg  metoprolol tart 2 hr prior to scan Adalyne Lovick

## 2020-11-12 ENCOUNTER — Ambulatory Visit (HOSPITAL_COMMUNITY)
Admission: RE | Admit: 2020-11-12 | Discharge: 2020-11-12 | Disposition: A | Discharge status: Home / Self Care | Payer: BC Managed Care – PPO | Source: Ambulatory Visit | Attending: Cardiology | Admitting: Cardiology

## 2020-11-12 DIAGNOSIS — R072 Precordial pain: Secondary | ICD-10-CM | POA: Insufficient documentation

## 2020-11-12 LAB — BASIC METABOLIC PANEL
BUN/Creatinine Ratio: 12 (ref 9–23)
BUN: 9 mg/dL (ref 6–24)
CO2: 21 mmol/L (ref 20–29)
Calcium: 9.4 mg/dL (ref 8.7–10.2)
Chloride: 99 mmol/L (ref 96–106)
Creatinine, Ser: 0.78 mg/dL (ref 0.57–1.00)
Glucose: 137 mg/dL — ABNORMAL HIGH (ref 65–99)
Potassium: 4.2 mmol/L (ref 3.5–5.2)
Sodium: 140 mmol/L (ref 134–144)
eGFR: 94 mL/min/{1.73_m2} (ref >59)

## 2020-11-12 MED ORDER — NITROGLYCERIN 0.4 MG SL SUBL
SUBLINGUAL_TABLET | SUBLINGUAL | Status: AC
  Filled 2020-11-12: qty 2

## 2020-11-12 MED ORDER — IOHEXOL 350 MG/ML SOLN
95.0000 mL | Freq: Once | INTRAVENOUS | Status: AC | PRN
  Administered 2020-11-12: 95 mL via INTRAVENOUS

## 2020-11-12 MED ORDER — NITROGLYCERIN 0.4 MG SL SUBL
0.8000 mg | SUBLINGUAL_TABLET | Freq: Once | SUBLINGUAL | Status: AC
  Administered 2020-11-12: 0.8 mg via SUBLINGUAL

## 2020-11-14 ENCOUNTER — Encounter: Payer: Self-pay | Admitting: Family Medicine

## 2020-11-15 ENCOUNTER — Other Ambulatory Visit: Payer: Self-pay | Admitting: Family Medicine

## 2020-11-15 DIAGNOSIS — E039 Hypothyroidism, unspecified: Secondary | ICD-10-CM

## 2020-11-25 ENCOUNTER — Ambulatory Visit (INDEPENDENT_AMBULATORY_CARE_PROVIDER_SITE_OTHER): Payer: BC Managed Care – PPO | Admitting: *Deleted

## 2020-11-25 ENCOUNTER — Other Ambulatory Visit: Payer: Self-pay

## 2020-11-25 DIAGNOSIS — E538 Deficiency of other specified B group vitamins: Secondary | ICD-10-CM

## 2020-11-25 MED ORDER — CYANOCOBALAMIN 1000 MCG/ML IJ SOLN
1000.0000 ug | INTRAMUSCULAR | Status: AC
  Administered 2020-11-25 – 2021-01-31 (×3): 1000 ug via INTRAMUSCULAR

## 2020-12-17 ENCOUNTER — Other Ambulatory Visit: Payer: Self-pay

## 2020-12-17 ENCOUNTER — Ambulatory Visit (INDEPENDENT_AMBULATORY_CARE_PROVIDER_SITE_OTHER): Payer: BC Managed Care – PPO | Admitting: Family Medicine

## 2020-12-17 ENCOUNTER — Encounter: Payer: Self-pay | Admitting: Family Medicine

## 2020-12-17 ENCOUNTER — Ambulatory Visit (INDEPENDENT_AMBULATORY_CARE_PROVIDER_SITE_OTHER): Payer: BC Managed Care – PPO

## 2020-12-17 VITALS — BP 112/75 | HR 89 | Temp 97.7°F | Ht 62.0 in | Wt 249.0 lb

## 2020-12-17 DIAGNOSIS — M79672 Pain in left foot: Secondary | ICD-10-CM

## 2020-12-17 DIAGNOSIS — S92502A Displaced unspecified fracture of left lesser toe(s), initial encounter for closed fracture: Secondary | ICD-10-CM

## 2020-12-17 NOTE — Progress Notes (Signed)
Subjective:  Patient ID: Christy Gay, female    DOB: 11/05/1975, 45 y.o.   MRN: [id number]  Patient Care Team: [name], DO as PCP - General (Family Medicine) Rollene Rotunda, MD as PCP - Cardiology (Cardiology)   Chief Complaint:  Foot Pain (Left side X2 weeks/)   HPI: Christy Gay is a 45 y.o. female presenting on 12/17/2020 for Foot Pain (Left side X2 weeks/)   Pt presents today with complaints of left distal lateral foot pain and left fourth and fifth toe pain. States she hit her toes on the leg of her bed two weeks ago.   Foot Pain This is a new problem. Episode onset: 2 weeks ago. The problem occurs constantly. The problem has been waxing and waning. Associated symptoms include arthralgias and joint swelling. Pertinent negatives include no abdominal pain, anorexia, change in bowel habit, chest pain, chills, congestion, coughing, diaphoresis, fatigue, fever, headaches, myalgias, nausea, neck pain, numbness, rash, sore throat, swollen glands, urinary symptoms, vertigo, visual change, vomiting or weakness. The symptoms are aggravated by walking and standing. She has tried acetaminophen, heat and ice for the symptoms. The treatment provided mild relief.    Relevant past medical, surgical, family, and social history reviewed and updated as indicated.  Allergies and medications reviewed and updated. Data reviewed: Chart in Epic.   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   Diabetes mellitus without complication (HCC)    gestational with first pregnancy   Headache(784.0)    migraines   Normal pregnancy, repeat 10/11/2013    Past Surgical History:  Procedure Laterality Date   CALCANEAL OSTEOTOMY Left 10/14/2017   Procedure: Right Calcaneal Osteotomy;  Surgeon: Toni Arthurs, MD;  Location: [facility];  Service: Orthopedics;  Laterality: Left;   CERVICAL  ABLATION     CESAREAN SECTION     CESAREAN SECTION N/A 10/12/2013   Procedure: CESAREAN SECTION;  Surgeon: Sherian Rein, MD;  Location: [facility];  Service: Obstetrics;  Laterality: N/A;   GASTROCNEMIUS RECESSION Right 10/14/2017   Procedure: Right Gastroc Recession;  Surgeon: Toni Arthurs, MD;  Location: [facility];  Service: Orthopedics;  Laterality: Right;   KIDNEY SURGERY     LIGAMENT REPAIR Right 10/14/2017   Procedure: Right Spring Ligament Repair;  Surgeon: Toni Arthurs, MD;  Location: [facility];  Service: Orthopedics;  Laterality: Right;   TENOLYSIS Right 10/14/2017   Procedure: Right Posterior Tibial Tenolysis;  Surgeon: Toni Arthurs, MD;  Location:  SURGERY CENTER;  Service: Orthopedics;  Laterality: Right;   TONSILLECTOMY     TUBAL LIGATION Bilateral 10/12/2013   Procedure: BILATERAL TUBAL LIGATION;  Surgeon: Sherian Rein, MD;  Location: [facility];  Service: Obstetrics;  Laterality: Bilateral;    Social History   Socioeconomic History   Marital status: Married    Spouse name: Not on file   Number of children: Not on file   Years of education: Not on file   Highest education level: Not on file  Occupational History   Not on file  Tobacco Use   Smoking status: Never   Smokeless tobacco: Never  Vaping Use   Vaping Use: Never used  Substance and Sexual Activity   Alcohol use: No   Drug use: No   Sexual activity: Not on file  Other Topics Concern   Not on file  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  Married.     Social Determinants of Health   Financial Resource Strain: Not on file  Food Insecurity: Not on file  Transportation Needs: Not on file  Physical Activity: Not on file  Stress: Not on file  Social Connections: Not on file  Intimate Partner Violence: Not on file    Outpatient Encounter Medications as of 12/17/2020  Medication Sig   Dapsone 5 % topical gel Apply  1 application topically 2 (two) times a week.   levothyroxine (SYNTHROID) 50 MCG tablet Take 1 tablet (50 mcg total) by mouth daily.   nitroGLYCERIN (NITROSTAT) 0.4 MG SL tablet Place under the tongue as needed.   venlafaxine XR (EFFEXOR-XR) 150 MG 24 hr capsule Take 150 mg by mouth daily with breakfast.   [DISCONTINUED] metoprolol tartrate (LOPRESSOR) 50 MG tablet Take 2 hours prior to procedure   Facility-Administered Encounter Medications as of 12/17/2020  Medication   cyanocobalamin ((VITAMIN B-12)) injection 1,000 mcg    Allergies  Allergen Reactions   Polyethylene Glycol Shortness Of Breath and Itching   Covid-19 (Subunit) Vaccine    Keflex [Cephalexin] Rash    Review of Systems  Constitutional:  Negative for activity change, appetite change, chills, diaphoresis, fatigue and fever.  HENT: Negative.  Negative for congestion and sore throat.   Eyes: Negative.   Respiratory:  Negative for cough, chest tightness and shortness of breath.   Cardiovascular:  Negative for chest pain, palpitations and leg swelling.  Gastrointestinal:  Negative for abdominal pain, anorexia, blood in stool, change in bowel habit, constipation, diarrhea, nausea and vomiting.  Endocrine: Negative.   Genitourinary:  Negative for dysuria, frequency and urgency.  Musculoskeletal:  Positive for arthralgias, gait problem and joint swelling. Negative for back pain, myalgias, neck pain and neck stiffness.  Skin: Negative.  Negative for rash.  Allergic/Immunologic: Negative.   Neurological:  Negative for dizziness, vertigo, weakness, numbness and headaches.  Hematological: Negative.   Psychiatric/Behavioral:  Negative for confusion, hallucinations, sleep disturbance and suicidal ideas.   All other systems reviewed and are negative.      Objective:  BP 112/75   Pulse 89   Temp 97.7 F (36.5 C)   Ht 5\' 2"  (1.575 m)   Wt 249 lb (112.9 kg)   SpO2 97%   BMI 45.54 kg/m    Wt Readings from Last 3 Encounters:   12/17/20 249 lb (112.9 kg)  11/01/20 249 lb (112.9 kg)  09/20/20 247 lb 6.4 oz (112.2 kg)    Physical Exam Vitals and nursing note reviewed.  Constitutional:      General: She is not in acute distress.    Appearance: Normal appearance. She is well-developed and well-groomed. She is obese. She is not ill-appearing, toxic-appearing or diaphoretic.  HENT:     Head: Normocephalic and atraumatic.     Jaw: There is normal jaw occlusion.     Right Ear: Hearing normal.     Left Ear: Hearing normal.     Nose: Nose normal.     Mouth/Throat:     Lips: Pink.     Mouth: Mucous membranes are moist.     Pharynx: Oropharynx is clear. Uvula midline.  Eyes:     General: Lids are normal.     Extraocular Movements: Extraocular movements intact.     Conjunctiva/sclera: Conjunctivae normal.     Pupils: Pupils are equal, round, and reactive to light.  Neck:  Thyroid: No thyroid mass, thyromegaly or thyroid tenderness.     Vascular: No carotid bruit or JVD.     Trachea: Trachea and phonation normal.  Cardiovascular:     Rate and Rhythm: Normal rate and regular rhythm.     Chest Wall: PMI is not displaced.     Pulses: Normal pulses.     Heart sounds: Normal heart sounds. No murmur heard.   No friction rub. No gallop.  Pulmonary:     Effort: Pulmonary effort is normal. No respiratory distress.     Breath sounds: Normal breath sounds. No wheezing.  Abdominal:     General: Bowel sounds are normal. There is no distension or abdominal bruit.     Palpations: Abdomen is soft. There is no hepatomegaly or splenomegaly.     Tenderness: There is no abdominal tenderness. There is no right CVA tenderness or left CVA tenderness.     Hernia: No hernia is present.  Musculoskeletal:     Cervical back: Normal range of motion and neck supple.     Right lower leg: No edema.     Left lower leg: No edema.     Right ankle: Normal.     Right Achilles Tendon: Normal.     Left ankle: Normal.     Left Achilles  Tendon: Normal.     Right foot: Normal.     Left foot: Decreased range of motion. Normal capillary refill. Swelling, tenderness and bony tenderness present. No deformity, bunion, Charcot foot, foot drop, prominent metatarsal heads, laceration or crepitus. Normal pulse.       Legs:  Lymphadenopathy:     Cervical: No cervical adenopathy.  Skin:    General: Skin is warm and dry.     Capillary Refill: Capillary refill takes less than 2 seconds.     Coloration: Skin is not cyanotic, jaundiced or pale.     Findings: No rash.  Neurological:     General: No focal deficit present.     Mental Status: She is alert and oriented to person, place, and time.     Cranial Nerves: Cranial nerves are intact.     Sensory: Sensation is intact.     Motor: Motor function is intact.     Coordination: Coordination is intact.     Gait: Gait is intact.     Deep Tendon Reflexes: Reflexes are normal and symmetric.  Psychiatric:        Attention and Perception: Attention and perception normal.        Mood and Affect: Mood and affect normal.        Speech: Speech normal.        Behavior: Behavior normal. Behavior is cooperative.        Thought Content: Thought content normal.        Cognition and Memory: Cognition and memory normal.        Judgment: Judgment normal.    Results for orders placed or performed in visit on 11/01/20  T4, free  Result Value Ref Range   Free T4 1.04 0.82 - 1.77 ng/dL  TSH  Result Value Ref Range   TSH 0.617 0.450 - 4.500 uIU/mL  Vitamin B12  Result Value Ref Range   Vitamin B-12 436 232 - 1,245 pg/mL     X-Ray: left foot: Minimally displaced fracture of left fifth toe. Preliminary x-ray reading by Kari Baars, FNP-C, [facility].   Pertinent labs & imaging results that were available during my care of the patient were  reviewed by me and considered in my medical decision making.  Assessment & Plan:  Sevin was seen today for foot pain.  Diagnoses and all orders for this  visit:  Left foot pain Closed fracture of phalanx of left fifth toe, initial encounter Imaging with noted left fifth toe fracture, minimally displaced. Will notify pt if radiology reading differs. Buddy taped toes and placed in postop shoe. Symptomatic care discussed in detail. Return in 4 weeks for reevaluation and repeat imaging. Will refer to ortho if warranted.  -     DG Foot Complete Left; Future   Continue all other maintenance medications.  Follow up plan: Return in about 4 weeks (around 01/14/2021), or if symptoms worsen or fail to improve.   Continue healthy lifestyle choices, including diet (rich in fruits, vegetables, and lean proteins, and low in salt and simple carbohydrates) and exercise (at least 30 minutes of moderate physical activity daily).  Educational handout given for toe fracture  The above assessment and management plan was discussed with the patient. The patient verbalized understanding of and has agreed to the management plan. Patient is aware to call the clinic if they develop any new symptoms or if symptoms persist or worsen. Patient is aware when to return to the clinic for a follow-up visit. Patient educated on when it is appropriate to go to the emergency department.   Kari Baars, FNP-C [facility] [phone]

## 2020-12-27 ENCOUNTER — Ambulatory Visit (INDEPENDENT_AMBULATORY_CARE_PROVIDER_SITE_OTHER): Payer: BC Managed Care – PPO | Admitting: *Deleted

## 2020-12-27 ENCOUNTER — Other Ambulatory Visit: Payer: Self-pay

## 2020-12-27 DIAGNOSIS — E538 Deficiency of other specified B group vitamins: Secondary | ICD-10-CM | POA: Diagnosis not present

## 2021-01-27 ENCOUNTER — Ambulatory Visit: Payer: BC Managed Care – PPO

## 2021-01-28 ENCOUNTER — Ambulatory Visit: Payer: BC Managed Care – PPO | Admitting: Family Medicine

## 2021-01-30 ENCOUNTER — Other Ambulatory Visit: Payer: Self-pay | Admitting: Family Medicine

## 2021-01-30 DIAGNOSIS — L700 Acne vulgaris: Secondary | ICD-10-CM

## 2021-01-31 ENCOUNTER — Ambulatory Visit (INDEPENDENT_AMBULATORY_CARE_PROVIDER_SITE_OTHER): Payer: BC Managed Care – PPO

## 2021-01-31 ENCOUNTER — Other Ambulatory Visit: Payer: Self-pay

## 2021-01-31 DIAGNOSIS — E538 Deficiency of other specified B group vitamins: Secondary | ICD-10-CM

## 2021-01-31 NOTE — Progress Notes (Signed)
Cyanocobalamin injection given to left upper arm.  Patient tolerated well.

## 2021-02-03 ENCOUNTER — Encounter: Payer: Self-pay | Admitting: Family

## 2021-02-03 ENCOUNTER — Other Ambulatory Visit: Payer: Self-pay

## 2021-02-03 ENCOUNTER — Ambulatory Visit (INDEPENDENT_AMBULATORY_CARE_PROVIDER_SITE_OTHER): Payer: BC Managed Care – PPO | Admitting: Family

## 2021-02-03 VITALS — BP 113/80 | HR 78 | Temp 97.4°F | Ht 62.0 in | Wt 253.0 lb

## 2021-02-03 DIAGNOSIS — Z09 Encounter for follow-up examination after completed treatment for conditions other than malignant neoplasm: Secondary | ICD-10-CM | POA: Diagnosis not present

## 2021-02-03 DIAGNOSIS — R519 Headache, unspecified: Secondary | ICD-10-CM | POA: Diagnosis not present

## 2021-02-03 DIAGNOSIS — R110 Nausea: Secondary | ICD-10-CM | POA: Diagnosis not present

## 2021-02-03 DIAGNOSIS — B0222 Postherpetic trigeminal neuralgia: Secondary | ICD-10-CM

## 2021-02-03 MED ORDER — PREDNISONE 10 MG (21) PO TBPK: ORAL_TABLET | ORAL | 0 refills | Status: DC

## 2021-02-03 MED ORDER — GABAPENTIN 300 MG PO CAPS: 300.0000 mg | ORAL_CAPSULE | Freq: Three times a day (TID) | ORAL | 0 refills | Status: DC

## 2021-02-03 MED ORDER — ONDANSETRON HCL 4 MG PO TABS: 4.0000 mg | ORAL_TABLET | Freq: Three times a day (TID) | ORAL | 0 refills | Status: DC | PRN

## 2021-02-03 NOTE — Progress Notes (Signed)
Subjective:    Patient ID: Christy Gay, female    DOB: [birth year], 45 y.o.   MRN: [id number]  Chief Complaint  Patient presents with   ER follow up     Patient was seen at [facility] on 9/25 for shingles around right eye   Pt presents to the office today for follow up on ED visit and shingles. She went to the ED on 01/26/21 for rash on right side of face and was diagnosed with shingles. She was given Valtrex 1000 mg TID for 10 days. She is still taking this.   She reports the swelling of her eye is improving, but having headaches, nausea, and photosensitivity.   Rash This is a new problem. The current episode started in the past 7 days. The problem has been gradually improving since onset. The affected locations include the face. Pertinent negatives include no vomiting.  Headache  This is a new problem. The current episode started in the past 7 days. The pain is located in the Right unilateral region. The pain quality is not similar to prior headaches. The quality of the pain is described as aching. The pain is at a severity of 7/10. Associated symptoms include nausea and photophobia. Pertinent negatives include no blurred vision, phonophobia, visual change or vomiting. She has tried NSAIDs (tylenol) for the symptoms. The treatment provided mild relief.     Review of Systems  Eyes:  Positive for photophobia. Negative for blurred vision.  Gastrointestinal:  Positive for nausea. Negative for vomiting.  Skin:  Positive for rash.  Neurological:  Positive for headaches.  All other systems reviewed and are negative.     Objective:   Physical Exam Vitals reviewed.  Constitutional:      General: She is not in acute distress.    Appearance: She is well-developed. She is obese.  HENT:     Head: Normocephalic and atraumatic.     Right Ear: Tympanic membrane normal.     Left Ear: Tympanic membrane normal.     Ears:     Comments: Sensitivity to light Eyes:     Pupils: Pupils are equal,  round, and reactive to light.  Neck:     Thyroid: No thyromegaly.  Cardiovascular:     Rate and Rhythm: Normal rate and regular rhythm.     Heart sounds: Normal heart sounds. No murmur heard. Pulmonary:     Effort: Pulmonary effort is normal. No respiratory distress.     Breath sounds: Normal breath sounds. No wheezing.  Abdominal:     General: Bowel sounds are normal. There is no distension.     Palpations: Abdomen is soft.     Tenderness: There is no abdominal tenderness.  Musculoskeletal:        General: No tenderness. Normal range of motion.     Cervical back: Normal range of motion and neck supple.  Skin:    General: Skin is warm and dry.     Findings: Rash present.          Comments: Dried erythemas rash  Neurological:     Mental Status: She is alert and oriented to person, place, and time.     Cranial Nerves: No cranial nerve deficit.     Deep Tendon Reflexes: Reflexes are normal and symmetric.  Psychiatric:        Behavior: Behavior normal.        Thought Content: Thought content normal.        Judgment: Judgment normal.  BP 113/80   Pulse 78   Temp (!) 97.4 F (36.3 C) (Temporal)   Ht 5\' 2"  (1.575 m)   Wt 253 lb (114.8 kg)   BMI 46.27 kg/m       Assessment & Plan:  Christy Gay comes in today with chief complaint of ER follow up  (Patient was seen at [facility] on 9/25 for shingles around right eye)   Diagnosis and orders addressed:  1. Trigeminal herpes zoster - predniSONE (STERAPRED UNI-PAK 21 TAB) 10 MG (21) TBPK tablet; Use as directed  Dispense: 21 tablet; Refill: 0 - gabapentin (NEURONTIN) 300 MG capsule; Take 1 capsule (300 mg total) by mouth 3 (three) times daily.  Dispense: 30 capsule; Refill: 0  2. Hospital discharge follow-up  3. Nonintractable headache, unspecified chronicity pattern, unspecified headache type  4. Nausea  - ondansetron (ZOFRAN) 4 MG tablet; Take 1 tablet (4 mg total) by mouth every 8 (eight) hours as needed for nausea  or vomiting.  Dispense: 20 tablet; Refill: 0   Continue Valtrex  Zofran as needed Will give prednisone She have gabapentin as needed for pain If symptoms continue or worsen may need CT scan given headache.  Go to ED with any changes in gait, speech, or vision.    10/25, FNP

## 2021-02-03 NOTE — Patient Instructions (Signed)
Shingles ?Shingles, which is also known as herpes zoster, is an infection that causes a painful skin rash and fluid-filled blisters. It is caused by a virus. ?Shingles only develops in people who: ?Have had chickenpox. ?Have been vaccinated against chickenpox. Shingles is rare in this group. ?What are the causes? ?Shingles is caused by varicella-zoster virus. This is the same virus that causes chickenpox. After a person is exposed to the virus, it stays in the body in an inactive (dormant) state. Shingles develops if the virus is reactivated. This can happen many years after the first (initial) exposure to the virus. It is not known what causes this virus to be reactivated. ?What increases the risk? ?People who have had chickenpox or received the chickenpox vaccine are at risk for shingles. Shingles infection is more common in people who: ?Are older than 47 years of age. ?Have a weakened disease-fighting system (immune system), such as people with: ?HIV (human immunodeficiency virus). ?AIDS (acquired immunodeficiency syndrome). ?Cancer. ?Are taking medicines that weaken the immune system, such as organ transplant medicines. ?Are experiencing a lot of stress. ?What are the signs or symptoms? ?Early symptoms of this condition include itching, tingling, and pain in an area on your skin. Pain may be described as burning, stabbing, or throbbing. ?A few days or weeks after early symptoms start, a painful red rash appears. The rash is usually on one side of the body and has a band-like or belt-like pattern. The rash eventually turns into fluid-filled blisters that break open, change into scabs, and dry up in about 2-3 weeks. ?At any time during the infection, you may also develop: ?A fever. ?Chills. ?A headache. ?Nausea. ?How is this diagnosed? ?This condition is diagnosed with a skin exam. Skin or fluid samples (a culture) may be taken from the blisters before a diagnosis is made. ?How is this treated? ?The rash may last  for several weeks. There is not a specific cure for this condition. Your health care provider may prescribe medicines to help you manage pain, recover more quickly, and avoid long-term problems. Medicines may include: ?Antiviral medicines. ?Anti-inflammatory medicines. ?Pain medicines. ?Anti-itching medicines (antihistamines). ?If the area involved is on your face, you may be referred to a specialist, such as an eye doctor (ophthalmologist) or an ear, nose, and throat (ENT) doctor (otorhinolaryngologist) to help you avoid eye problems, chronic pain, or disability. ?Follow these instructions at home: ?Medicines ?Take over-the-counter and prescription medicines only as told by your health care provider. ?Apply an anti-itch cream or numbing cream to the affected area as told by your health care provider. ?Relieving itching and discomfort ? ?Apply cold, wet cloths (cold compresses) to the area of the rash or blisters as told by your health care provider. ?Cool baths can be soothing. Try adding baking soda or dry oatmeal to the water to reduce itching. Do not bathe in hot water. ?Use calamine lotion as recommended by your health care provider. This is an over-the-counter lotion that helps to relieve itchiness. ?Blister and rash care ?Keep your rash covered with a loose bandage (dressing). Wear loose-fitting clothing to help ease the pain of material rubbing against the rash. ?Wash your hands with soap and water for at least 20 seconds before and after you change your dressing. If soap and water are not available, use hand sanitizer. ?Change your dressing as told by your health care provider. ?Keep your rash and blisters clean by washing the area with mild soap and cool water as told by your health   care provider. ?Check your rash every day for signs of infection. Check for: ?More redness, swelling, or pain. ?Fluid or blood. ?Warmth. ?Pus or a bad smell. ?Do not scratch your rash or pick at your blisters. To help avoid  scratching: ?Keep your fingernails clean and cut short. ?Wear gloves or mittens while you sleep, if scratching is a problem. ?General instructions ?Rest as told by your health care provider. ?Wash your hands often with soap and water for at least 20 seconds. If soap and water are not available, use hand sanitizer. Doing this lowers your chance of getting a bacterial skin infection. ?Before your blisters change into scabs, your shingles infection can cause chickenpox in people who have never had it or have never been vaccinated against it. To prevent this from happening, avoid contact with other people, especially: ?Babies. ?Pregnant women. ?Children who have eczema. ?Older people who have transplants. ?People who have chronic illnesses, such as cancer or AIDS. ?Keep all follow-up visits. This is important. ?How is this prevented? ?Getting vaccinated is the best way to prevent shingles and protect against shingles complications. If you have not been vaccinated, talk with your health care provider about getting the vaccine. ?Where to find more information ?Centers for Disease Control and Prevention: www.cdc.gov ?Contact a health care provider if: ?Your pain is not relieved with prescribed medicines. ?Your pain does not get better after the rash heals. ?You have any of these signs of infection: ?More redness, swelling, or pain around the rash. ?Fluid or blood coming from the rash. ?Warmth coming from your rash. ?Pus or a bad smell coming from the rash. ?A fever. ?Get help right away if: ?The rash is on your face or nose. ?You have facial pain, pain around your eye area, or loss of feeling on one side of your face. ?You have difficulty seeing. ?You have ear pain or have ringing in your ear. ?You have a loss of taste. ?Your condition gets worse. ?Summary ?Shingles, also known as herpes zoster, is an infection that causes a painful skin rash and fluid-filled blisters. ?This condition is diagnosed with a skin exam. Skin or  fluid samples (a culture) may be taken from the blisters. ?Keep your rash covered with a loose bandage (dressing). Wear loose-fitting clothing to help ease the pain of material rubbing against the rash. ?Before your blisters change into scabs, your shingles infection can cause chickenpox in people who have never had it or have never been vaccinated against it. ?This information is not intended to replace advice given to you by your health care provider. Make sure you discuss any questions you have with your health care provider. ?Document Revised: 04/15/2020 Document Reviewed: 04/15/2020 ?Elsevier Patient Education ? 2022 Elsevier Inc. ? ?

## 2021-02-12 ENCOUNTER — Other Ambulatory Visit: Payer: Self-pay

## 2021-02-12 ENCOUNTER — Other Ambulatory Visit: Payer: BC Managed Care – PPO

## 2021-02-12 DIAGNOSIS — E039 Hypothyroidism, unspecified: Secondary | ICD-10-CM

## 2021-02-13 LAB — THYROID PANEL WITH TSH
Free Thyroxine Index: 1.8 (ref 1.2–4.9)
T3 Uptake Ratio: 27 % (ref 24–39)
T4, Total: 6.6 ug/dL (ref 4.5–12.0)
TSH: 0.973 u[IU]/mL (ref 0.450–4.500)

## 2021-04-01 ENCOUNTER — Ambulatory Visit: Payer: BC Managed Care – PPO | Admitting: Family Medicine

## 2021-04-01 ENCOUNTER — Encounter: Payer: Self-pay | Admitting: Family Medicine

## 2021-04-01 VITALS — BP 131/87 | HR 96 | Temp 98.2°F | Ht 62.0 in | Wt 255.2 lb

## 2021-04-01 DIAGNOSIS — E039 Hypothyroidism, unspecified: Secondary | ICD-10-CM | POA: Diagnosis not present

## 2021-04-01 DIAGNOSIS — E538 Deficiency of other specified B group vitamins: Secondary | ICD-10-CM

## 2021-04-01 NOTE — Patient Instructions (Signed)
Saxenda: once daily injection for weight loss Wegovy: once weekly injection  See if your insurance covers either

## 2021-04-01 NOTE — Progress Notes (Signed)
Subjective: Christy Gay up PCP: [name], DO [id number] B Landmark is a 45 y.o. female presenting to clinic today for:  1.  Hypothyroidism/morbid obesity Patient is compliant with Synthroid 50 mcg daily.  She reports some improvement in energy but admits she continues to have issues with trying to lose weight.  2.  Vitamin B12 deficiency Patient was being treated with B12 injection for B12 deficiency.  Last injection was back in September.  She is not currently taking any oral B12 and it hoped that B12 level will be checked.  Unfortunately only her thyroid labs were checked in October.  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   Diabetes mellitus without complication (HCC)    gestational with first pregnancy   Headache(784.0)    migraines   Normal pregnancy, repeat 10/11/2013    Current Outpatient Medications:    Dapsone 5 % topical gel, APPLY PEA SIZED AMOUNT TO THE FACE TWICE DAILY FOR ACNE AS DIRECTED., Disp: 60 g, Rfl: 12   gabapentin (NEURONTIN) 300 MG capsule, Take 1 capsule (300 mg total) by mouth 3 (three) times daily., Disp: 30 capsule, Rfl: 0   levothyroxine (SYNTHROID) 50 MCG tablet, Take 1 tablet (50 mcg total) by mouth daily., Disp: 90 tablet, Rfl: 3   nitroGLYCERIN (NITROSTAT) 0.4 MG SL tablet, Place under the tongue as needed., Disp: , Rfl:    ondansetron (ZOFRAN) 4 MG tablet, Take 1 tablet (4 mg total) by mouth every 8 (eight) hours as needed for nausea or vomiting., Disp: 20 tablet, Rfl: 0   valACYclovir (VALTREX) 1000 MG tablet, Take 1,000 mg by mouth 3 (three) times daily., Disp: , Rfl:    venlafaxine XR (EFFEXOR-XR) 150 MG 24 hr capsule, Take 150 mg by  mouth daily with breakfast., Disp: , Rfl:  Social History   Socioeconomic History   Marital status: Married    Spouse name: Not on file   Number of children: Not on file   Years of education: Not on file   Highest education level: Not on file  Occupational History   Not on file  Tobacco Use   Smoking status: Never   Smokeless tobacco: Never  Vaping Use   Vaping Use: Never used  Substance and Sexual Activity   Alcohol use: No   Drug use: No   Sexual activity: Not on file  Other Topics Concern   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     Social Determinants of Health   Financial Resource Strain: Not on file  Food Insecurity: Not on file  Transportation Needs: Not on file  Physical Activity: Not on file  Stress: Not on file  Social Connections: Not on file  Intimate Partner Violence: Not on file   Family History  Problem Relation Age of Onset   Heart disease Mother        RHD    Objective: Office vital signs reviewed. BP 131/87   Pulse 96   Temp 98.2 F (36.8 C)   Ht 5\' 2"  (1.575 m)   Wt 255 lb 3.2 oz (  115.8 kg)   SpO2 96%   BMI 46.68 kg/m   Physical Examination:  General: Awake, alert, morbidly obese, No acute distress HEENT: No exophthalmos or goiter Cardio: regular rate and rhythm, S1S2 heard, no murmurs appreciated Pulm: clear to auscultation bilaterally, no wheezes, rhonchi or rales; normal work of breathing on room air Neuro: Vibratory sensation intact  Assessment/ Plan: 45 y.o. female   Acquired hypothyroidism  B12 deficiency - Plan: Vitamin B12  Morbid obesity (HCC)  Continues to struggle with weight but thyroid labs are normal.  B12 level collected today.  Anticipate need for oral B12 going forward  Pertaining to her morbid obesity, we discussed consideration for injection therapy.  I advised her to contact her insurance company to see if [place] or [facility] might have her.   I will be glad to initiate this for her as I do think weight loss would overall improve her health  No orders of the defined types were placed in this encounter.  No orders of the defined types were placed in this encounter.    [name], DO [facility] [phone]

## 2021-04-02 LAB — VITAMIN B12: Vitamin B-12: 351 pg/mL (ref 232–1245)

## 2021-05-03 LAB — HM PAP SMEAR: HPV, high-risk: NEGATIVE

## 2021-05-06 ENCOUNTER — Other Ambulatory Visit: Payer: Self-pay | Admitting: Family Medicine

## 2021-05-06 ENCOUNTER — Telehealth: Payer: Self-pay | Admitting: Family Medicine

## 2021-05-06 DIAGNOSIS — B0222 Postherpetic trigeminal neuralgia: Secondary | ICD-10-CM

## 2021-05-06 MED ORDER — GABAPENTIN 300 MG PO CAPS: 300.0000 mg | ORAL_CAPSULE | Freq: Three times a day (TID) | ORAL | 3 refills | Status: DC

## 2021-05-06 NOTE — Telephone Encounter (Signed)
°  Prescription Request  05/06/2021  Is this a "Controlled Substance" medicine? no  Have you seen your PCP in the last 2 weeks? No, pt has appt on 05/09/21 she wants this medication for a headache she has because of shingles she was made aware she ntbs   If YES, route message to pool  -  If NO, patient needs to be scheduled for appointment.  What is the name of the medication or equipment? gabapentin (NEURONTIN) 300 MG capsule   Have you contacted your pharmacy to request a refill? YES   Which pharmacy would you like this sent to? [facility]    Patient notified that their request is being sent to the clinical staff for review and that they should receive a response within 2 business days.

## 2021-05-06 NOTE — Telephone Encounter (Signed)
Pt aware meds has been sent to [facility]

## 2021-05-06 NOTE — Telephone Encounter (Signed)
Pt aware meds has

## 2021-05-07 ENCOUNTER — Encounter: Payer: Self-pay | Admitting: Family Medicine

## 2021-05-09 ENCOUNTER — Encounter: Payer: Self-pay | Admitting: Nurse Practitioner

## 2021-05-09 ENCOUNTER — Ambulatory Visit: Payer: BC Managed Care – PPO | Admitting: Nurse Practitioner

## 2021-05-09 VITALS — BP 138/86 | HR 92 | Temp 97.9°F | Ht 62.0 in | Wt 257.1 lb

## 2021-05-09 DIAGNOSIS — G500 Trigeminal neuralgia: Secondary | ICD-10-CM | POA: Insufficient documentation

## 2021-05-09 NOTE — Assessment & Plan Note (Signed)
Symptoms not well controlled.  Pain worsening after facial shingles outbreak.  Patient is currently on gabapentin 300 mg tablet 1 capsule by mouth 3 times daily.  Patient reports that she had not started taking her refill but wanted to make sure that her headache was not just a regular migraine.  After assessment symptoms presents as trigeminal neuralgia.  Advised patient to continue gabapentin as prescribed.  And follow-up with worsening unresolved symptoms, patient verbalized understanding.

## 2021-05-09 NOTE — Patient Instructions (Signed)
Trigeminal Neuralgia Trigeminal neuralgia is a nerve disorder that causes severe pain on one side of the face. The pain may last from a few seconds to several minutes, but it can happen hundreds of times a day. The pain is usually only on one side of the face. Symptoms may occur for days, weeks, or months and then go away for months or years. The pain may return and be worse than before. What are the causes? This condition may be caused by: Damage or pressure to a nerve in the head that is called the trigeminal nerve. An attack can be triggered by: Talking or chewing. Putting on makeup. Washing, shaving, or touching your face. Brushing your teeth. Blasts of hot or cold air. Primary demyelinating disorders, such as multiple sclerosis. Tumors. What increases the risk? You are more likely to develop this condition if: You are 50-60 years old. You are female. What are the signs or symptoms? The main symptom of this condition is severe pain in the jaw, lips, eyes, nose, scalp, forehead, and face. How is this diagnosed? This condition is diagnosed with a physical exam. A CT scan or an MRI may be done to rule out other conditions that can cause facial pain. How is this treated? This condition may be treated with: Measures to avoid the things that trigger your symptoms. Prescription medicines such as anticonvulsants. Procedures such as ablation, thermal, or radiation therapy. Cognitive or behavioral therapy. Complementary therapies such as: Gentle, regular exercise or yoga. Meditation. Aromatherapy. Acupuncture. Surgery. This may be done in severe cases if other medical treatment does not provide relief. It may take up to one month for treatment to start relieving the pain. Follow these instructions at home: Managing pain  Learn as much as you can about how to manage your pain. Ask your health care provider if a pain specialist would be helpful. Consider talking with a mental health  care provider about how to cope with the pain. Consider joining a pain support group. General instructions Take over-the-counter and prescription medicines only as told by your health care provider. Avoid the things that trigger your symptoms. It may help to: Chew on the unaffected side of your mouth. Avoid touching your face. Avoid blasts of hot or cold air. Keep all follow-up visits. Where to find more information Facial Pain Association: facepain.org Contact a health care provider if: Your medicine is not helping your symptoms. You have side effects from the medicine used for treatment. You develop new, unexplained symptoms, such as: Double vision. Facial weakness or numbness. Changes in hearing or balance. You feel depressed. Get help right away if: Your pain is severe and is not getting better. You develop suicidal thoughts. If you ever feel like you may hurt yourself or others, or have thoughts about taking your own life, get help right away. Go to your nearest emergency department or: Call your local emergency services (911 in the [place] Call a suicide crisis helpline, such as the National Suicide Prevention Lifeline at 1-800-273-8255 or 988 in the [place] This is open 24 hours a day in the [place] Text the Crisis Text Line at 741741 (in the [place] Summary Trigeminal neuralgia is a nerve disorder that causes severe pain on one side of the face. The pain may last from a few seconds to several minutes. This condition is caused by damage or pressure to a nerve in the head that is called the trigeminal nerve. Treatment may include avoiding the things that trigger your symptoms, taking   medicines, or having procedures or surgery. It may take up to one month for treatment to start relieving the pain. Keep all follow-up visits. This information is not intended to replace advice given to you by your health care provider. Make sure you discuss any questions you have with your health care  provider. Document Revised: 11/14/2020 Document Reviewed: 10/14/2020 Elsevier Patient Education  2022 Elsevier Inc.  
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
## 2021-05-29 ENCOUNTER — Ambulatory Visit: Payer: BC Managed Care – PPO | Admitting: Family Medicine

## 2021-09-17 ENCOUNTER — Ambulatory Visit: Payer: BC Managed Care – PPO | Admitting: Family Medicine

## 2021-09-17 ENCOUNTER — Encounter: Payer: Self-pay | Admitting: Family Medicine

## 2021-09-17 VITALS — BP 125/84 | HR 88 | Temp 98.1°F | Ht 62.0 in | Wt 260.6 lb

## 2021-09-17 DIAGNOSIS — E538 Deficiency of other specified B group vitamins: Secondary | ICD-10-CM | POA: Diagnosis not present

## 2021-09-17 DIAGNOSIS — G500 Trigeminal neuralgia: Secondary | ICD-10-CM

## 2021-09-17 DIAGNOSIS — S99921A Unspecified injury of right foot, initial encounter: Secondary | ICD-10-CM

## 2021-09-17 DIAGNOSIS — E039 Hypothyroidism, unspecified: Secondary | ICD-10-CM | POA: Diagnosis not present
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
## 2021-09-17 NOTE — Patient Instructions (Signed)
Start your shot ASAP.  Have to lose 5% of body weight in first 4 months for insurance to continue covering ?Go to the [employer] for the $0 coupon card. ? ?Tips for success with Wegovy (and by success, how not to be super sick on your stomach): ?Eat small meals ?AVOID heavy foods (fried/ high in carbs like bread, pasta, rice) ?AVOID carbonated beverages (soda/ beer, as these can increase bloating) ?DOUBLE your water intake (will help you avoid constipation/ dehydration) ? ?Wegovy CAN cause: ?Nausea ?Abdominal pain ?Increased acid reflux (sometimes presents as "sour burps") ?Constipation OR Diarrhea ?Fatigue (especially when you first start it) ?Toe Fracture ?A toe fracture is a break in one of the toe bones (phalanges). This may happen if you: ?Drop a heavy object on your toe. ?Stub your toe. ?Twist your toe. ?Exercise the same way too much. ?What are the signs or symptoms? ?The main symptoms are swelling and pain in the toe. You may also have: ?Bruising. ?Stiffness. ?Numbness. ?A change in the way the toe looks. ?Broken bones that poke through the skin. ?Blood under the toenail. ?How is this treated? ?Treatments may include: ?Taping the broken toe to a toe that is next to it (buddy taping). ?Wearing a shoe that has a wide, rigid sole to protect the toe and to limit its movement. ?Wearing a cast. ?Surgery. This may be needed if the: ?Pieces of broken bone are out of place. ?Bone pokes through the skin. ?Physical therapy. ?Follow these instructions at home: ?If you have a shoe: ?Wear the shoe as told by your doctor. Remove it only as told by your doctor. ?Loosen the shoe if your toes tingle, become numb, or turn cold and blue. ?Keep the shoe clean and dry. ?If you have a cast: ?Do not put pressure on any part of the cast until it is fully hardened. This may take a few hours. ?Do not stick anything inside the cast to scratch your skin. ?Check the skin around the cast every day. Tell your doctor about any  concerns. ?You may put lotion on dry skin around the edges of the cast. ?Do not put lotion on the skin under the cast. ?Keep the cast clean and dry. ?Bathing ?Do not take baths, swim, or use a hot tub until your doctor says it is okay. Ask your doctor if you can take showers. ?If the shoe or cast is not waterproof: ?Do not let it get wet. ?Cover it with a watertight covering when you take a bath or a shower. ?Activity ?Do not use your foot to support your body weight until your doctor says it is okay. ?Use crutches as told by your doctor. ?Ask your doctor what activities are safe for you during recovery. ?Avoid activities as told by your doctor. ?Do exercises as told by your doctor or therapist. ?Driving ?Do not drive or use heavy machinery while taking pain medicine. ?Do not drive while wearing a cast on a foot that you use for driving. ?Managing pain, stiffness, and swelling ? ?Put ice on the injured area if told by your doctor: ?Put ice in a plastic bag. ?Place a towel between your skin and the bag. ?If you have a shoe, remove it as told by your doctor. ?If you have a cast, place a towel between your cast and the bag. ?Leave the ice on for 20 minutes, 2-3 times per day. ?Raise (elevate) the injured area above the level of your heart while you are sitting or lying down. ?  General instructions ?If your toe was taped to a toe that is next to it, follow your doctor's instructions for changing the gauze and tape. Change it more often: ?If the gauze and tape get wet. If this happens, dry the space between the toes. ?If the gauze and tape are too tight and they cause your toe to become pale or to lose feeling (go numb). ?If your doctor did not give you a protective shoe, wear sturdy shoes that support your foot. Your shoes should not: ?Pinch your toes. ?Fit tightly against your toes. ?Do not use any tobacco products, including cigarettes, chewing tobacco, or e-cigarettes. These can delay bone healing. If you need help  quitting, ask your doctor. ?Take medicines only as told by your doctor. ?Keep all follow-up visits as told by your doctor. This is important. ?Contact a doctor if: ?Your pain medicine is not helping. ?You have a fever. ?You notice a bad smell coming from your cast. ?Get help right away if: ?You lose feeling (have numbness) in your toe or foot, and it is getting worse. ?Your toe or your foot tingles. ?Your toe or your foot gets cold or turns blue. ?You have redness or swelling in your toe or foot, and it is getting worse. ?You have very bad pain. ?Summary ?A toe fracture is a break in one of the toe bones. ?Use ice and raise your foot. This will help lessen pain and swelling. ?Use crutches as told by your doctor. ?This information is not intended to replace advice given to you by your health care provider. Make sure you discuss any questions you have with your health care provider. ?Document Revised: 09/23/2020 Document Reviewed: 09/23/2020 ?Elsevier Patient Education ? [place]. ? ?

## 2021-09-18 LAB — BAYER DCA HB A1C WAIVED: HB A1C (BAYER DCA - WAIVED): 6.3 % — ABNORMAL HIGH (ref 4.8–5.6)

## 2021-09-18 LAB — CMP14+EGFR
ALT: 25 IU/L (ref 0–32)
AST: 20 IU/L (ref 0–40)
Albumin/Globulin Ratio: 1.6 (ref 1.2–2.2)
Albumin: 4.2 g/dL (ref 3.8–4.8)
Alkaline Phosphatase: 80 IU/L (ref 44–121)
BUN/Creatinine Ratio: 15 (ref 9–23)
BUN: 11 mg/dL (ref 6–24)
Bilirubin Total: 0.2 mg/dL (ref 0.0–1.2)
CO2: 19 mmol/L — ABNORMAL LOW (ref 20–29)
Calcium: 9.6 mg/dL (ref 8.7–10.2)
Chloride: 104 mmol/L (ref 96–106)
Creatinine, Ser: 0.71 mg/dL (ref 0.57–1.00)
Globulin, Total: 2.6 g/dL (ref 1.5–4.5)
Glucose: 132 mg/dL — ABNORMAL HIGH (ref 70–99)
Potassium: 4.1 mmol/L (ref 3.5–5.2)
Sodium: 142 mmol/L (ref 134–144)
Total Protein: 6.8 g/dL (ref 6.0–8.5)
eGFR: 105 mL/min/{1.73_m2} (ref >59)

## 2021-09-18 LAB — CBC
Hematocrit: 38.6 % (ref 34.0–46.6)
Hemoglobin: 13.4 g/dL (ref 11.1–15.9)
MCH: 29.5 pg (ref 26.6–33.0)
MCHC: 34.7 g/dL (ref 31.5–35.7)
MCV: 85 fL (ref 79–97)
Platelets: 317 10*3/uL (ref 150–450)
RBC: 4.55 x10E6/uL (ref 3.77–5.28)
RDW: 13.5 % (ref 11.7–15.4)
WBC: 7.6 10*3/uL (ref 3.4–10.8)

## 2021-09-18 LAB — LDL CHOLESTEROL, DIRECT: LDL Direct: 142 mg/dL — ABNORMAL HIGH (ref 0–99)

## 2021-09-18 LAB — VITAMIN B12: Vitamin B-12: 515 pg/mL (ref 232–1245)

## 2021-09-18 LAB — T4, FREE: Free T4: 0.91 ng/dL (ref 0.82–1.77)

## 2021-09-18 LAB — TSH: TSH: 1.53 u[IU]/mL (ref 0.450–4.500)

## 2021-11-14 ENCOUNTER — Other Ambulatory Visit: Payer: Self-pay | Admitting: Family Medicine

## 2021-11-14 DIAGNOSIS — E039 Hypothyroidism, unspecified: Secondary | ICD-10-CM

## 2021-12-23 ENCOUNTER — Ambulatory Visit: Payer: BC Managed Care – PPO | Admitting: Family Medicine

## 2021-12-23 ENCOUNTER — Encounter: Payer: Self-pay | Admitting: Family Medicine

## 2022-01-08 ENCOUNTER — Encounter: Payer: Self-pay | Admitting: Family Medicine

## 2022-01-08 DIAGNOSIS — R7303 Prediabetes: Secondary | ICD-10-CM

## 2022-01-16 ENCOUNTER — Other Ambulatory Visit: Payer: BC Managed Care – PPO

## 2022-02-18 ENCOUNTER — Encounter: Payer: Self-pay | Admitting: Family Medicine

## 2022-05-12 ENCOUNTER — Other Ambulatory Visit: Payer: BC Managed Care – PPO

## 2022-06-01 ENCOUNTER — Telehealth: Payer: Self-pay | Admitting: Family Medicine

## 2022-06-01 NOTE — Telephone Encounter (Signed)
She needs to be seen.

## 2022-06-01 NOTE — Telephone Encounter (Signed)
Spoke with mom , she did stay she would wait another day to see if it gets better

## 2022-07-17 ENCOUNTER — Other Ambulatory Visit: Payer: BC Managed Care – PPO

## 2022-07-24 ENCOUNTER — Other Ambulatory Visit: Payer: BC Managed Care – PPO

## 2022-07-31 ENCOUNTER — Other Ambulatory Visit: Payer: Self-pay | Admitting: Specialist

## 2022-07-31 DIAGNOSIS — R519 Headache, unspecified: Secondary | ICD-10-CM

## 2022-08-14 ENCOUNTER — Encounter (INDEPENDENT_AMBULATORY_CARE_PROVIDER_SITE_OTHER): Payer: BC Managed Care – PPO | Admitting: Family Medicine

## 2022-08-14 DIAGNOSIS — B3731 Acute candidiasis of vulva and vagina: Secondary | ICD-10-CM

## 2022-08-17 MED ORDER — FLUCONAZOLE 150 MG PO TABS: 150.0000 mg | ORAL_TABLET | Freq: Once | ORAL | 0 refills | Status: AC

## 2022-08-17 NOTE — Telephone Encounter (Signed)

## 2022-08-22 ENCOUNTER — Ambulatory Visit
Admission: RE | Admit: 2022-08-22 | Discharge: 2022-08-22 | Disposition: A | Discharge status: Home / Self Care | Payer: BC Managed Care – PPO | Source: Ambulatory Visit | Attending: Specialist | Admitting: Specialist

## 2022-08-22 DIAGNOSIS — R519 Headache, unspecified: Secondary | ICD-10-CM

## 2022-10-16 ENCOUNTER — Other Ambulatory Visit: Payer: Self-pay | Admitting: Family Medicine

## 2022-10-16 ENCOUNTER — Ambulatory Visit (INDEPENDENT_AMBULATORY_CARE_PROVIDER_SITE_OTHER): Payer: BC Managed Care – PPO | Admitting: Family Medicine

## 2022-10-16 ENCOUNTER — Encounter: Payer: Self-pay | Admitting: Family Medicine

## 2022-10-16 VITALS — BP 117/81 | HR 71 | Temp 98.7°F | Ht 62.0 in | Wt 237.0 lb

## 2022-10-16 DIAGNOSIS — L700 Acne vulgaris: Secondary | ICD-10-CM

## 2022-10-16 DIAGNOSIS — E039 Hypothyroidism, unspecified: Secondary | ICD-10-CM | POA: Diagnosis not present

## 2022-10-16 DIAGNOSIS — E538 Deficiency of other specified B group vitamins: Secondary | ICD-10-CM

## 2022-10-16 DIAGNOSIS — M25571 Pain in right ankle and joints of right foot: Secondary | ICD-10-CM

## 2022-10-16 DIAGNOSIS — E1169 Type 2 diabetes mellitus with other specified complication: Secondary | ICD-10-CM | POA: Diagnosis not present

## 2022-10-16 DIAGNOSIS — E119 Type 2 diabetes mellitus without complications: Secondary | ICD-10-CM | POA: Insufficient documentation

## 2022-10-16 DIAGNOSIS — E785 Hyperlipidemia, unspecified: Secondary | ICD-10-CM

## 2022-10-16 LAB — BAYER DCA HB A1C WAIVED: HB A1C (BAYER DCA - WAIVED): 6.4 % — ABNORMAL HIGH (ref 4.8–5.6)
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
## 2022-10-16 NOTE — Patient Instructions (Addendum)
We will get you scheduled for a diabetic retinal exam here in office in the next month or so.  Diabetes Mellitus and Standards of Medical Care Living with and managing diabetes (diabetes mellitus) can be complicated. Your diabetes treatment may be managed by a team of health care providers, including: A physician who specializes in diabetes (endocrinologist). You might also have visits with a nurse practitioner or physician assistant. Nurses. A registered dietitian. A certified diabetes care and education specialist. An exercise specialist. A pharmacist. An eye doctor. A foot specialist (podiatrist). A dental care provider. A primary care provider. A mental health care provider. How to manage your diabetes You can do many things to successfully manage your diabetes. Your health care providers will follow guidelines to help you get the best quality of care. Here are general guidelines for your diabetes management plan. Your health care providers may give you more specific instructions. Physical exams When you are diagnosed with diabetes, and each year after that, your health care provider will ask about your medical and family history. You will have a physical exam, which may include: Measuring your height, weight, and body mass index (BMI). Checking your blood pressure. This will be done at every routine medical visit. Your target blood pressure may vary depending on your medical conditions, your age, and other factors. A thyroid exam. A skin exam. Screening for nerve damage (peripheral neuropathy). This may include checking the pulse in your legs and feet and the level of sensation in your hands and feet. A foot exam to inspect the structure and skin of your feet, including checking for cuts, bruises, redness, blisters, sores, or other problems. Screening for blood vessel (vascular) problems. This may include checking the pulse in your legs and feet and checking your temperature. Blood  tests Depending on your treatment plan and your personal needs, you may have the following tests: Hemoglobin A1C (HbA1C). This test provides information about blood sugar (glucose) control over the previous 2-3 months. It is used to adjust your treatment plan, if needed. This test will be done: At least 2 times a year, if you are meeting your treatment goals. 4 times a year, if you are not meeting your treatment goals or if your goals have changed. Lipid testing, including total cholesterol, LDL and HDL cholesterol, and triglyceride levels. The goal for LDL is less than 100 mg/dL (5.5 mmol/L). If you are at high risk for complications, the goal is less than 70 mg/dL (3.9 mmol/L). The goal for HDL is 40 mg/dL (2.2 mmol/L) or higher for men, and 50 mg/dL (2.8 mmol/L) or higher for women. An HDL cholesterol of 60 mg/dL (3.3 mmol/L) or higher gives some protection against heart disease. The goal for triglycerides is less than 150 mg/dL (8.3 mmol/L). Liver function tests. Kidney function tests. Thyroid function tests.  Dental and eye exams  Visit your dentist two times a year. If you have type 1 diabetes, your health care provider may recommend an eye exam within 5 years after you are diagnosed, and then once a year after your first exam. For children with type 1 diabetes, the health care provider may recommend an eye exam when your child is age 49 or older and has had diabetes for 3-5 years. After the first exam, your child should get an eye exam once a year. If you have type 2 diabetes, your health care provider may recommend an eye exam as soon as you are diagnosed, and then every 1-2 years after your  first exam. Immunizations A yearly flu (influenza) vaccine is recommended annually for everyone 6 months or older. This is especially important if you have diabetes. The pneumonia (pneumococcal) vaccine is recommended for everyone 2 years or older who has diabetes. If you are age 49 or older, you may  get the pneumonia vaccine as a series of two separate shots. The hepatitis B vaccine is recommended for adults shortly after being diagnosed with diabetes. Adults and children with diabetes should receive all other vaccines according to age-specific recommendations from the Centers for Disease Control and Prevention (CDC). Mental and emotional health Screening for symptoms of eating disorders, anxiety, and depression is recommended at the time of diagnosis and after as needed. If your screening shows that you have symptoms, you may need more evaluation. You may work with a mental health care provider. Follow these instructions at home: Treatment plan You will monitor your blood glucose levels and may give yourself insulin. Your treatment plan will be reviewed at every medical visit. You and your health care provider will discuss: How you are taking your medicines, including insulin. Any side effects you have. Your blood glucose level target goals. How often you monitor your blood glucose level. Lifestyle habits, such as activity level and tobacco, alcohol, and substance use. Education Your health care provider will assess how well you are monitoring your blood glucose levels and whether you are taking your insulin and medicines correctly. He or she may refer you to: A certified diabetes care and education specialist to manage your diabetes throughout your life, starting at diagnosis. A registered dietitian who can create and review your personal nutrition plan. An exercise specialist who can discuss your activity level and exercise plan. General instructions Take over-the-counter and prescription medicines only as told by your health care provider. Keep all follow-up visits. This is important. Where to find support There are many diabetes support networks, including: American Diabetes Association (ADA): diabetes.org Defeat Diabetes Foundation: defeatdiabetes.org Where to find more  information American Diabetes Association (ADA): www.diabetes.org Association of Diabetes Care & Education Specialists (ADCES): diabeteseducator.org International Diabetes Federation (IDF): http://hill.biz/ Summary Managing diabetes (diabetes mellitus) can be complicated. Your diabetes treatment may be managed by a team of health care providers. Your health care providers follow guidelines to help you get the best quality care. You should have physical exams, blood tests, blood pressure monitoring, immunizations, and screening tests regularly. Stay updated on how to manage your diabetes. Your health care providers may also give you more specific instructions based on your individual health. This information is not intended to replace advice given to you by your health care provider. Make sure you discuss any questions you have with your health care provider. Document Revised: 10/26/2019 Document Reviewed: 10/26/2019 Elsevier Patient Education  2024 [employer].

## 2022-10-17 LAB — CMP14+EGFR
ALT: 16 IU/L (ref 0–32)
AST: 13 IU/L (ref 0–40)
Albumin: 4.5 g/dL (ref 3.9–4.9)
Alkaline Phosphatase: 85 IU/L (ref 44–121)
BUN/Creatinine Ratio: 16 (ref 9–23)
BUN: 13 mg/dL (ref 6–24)
Bilirubin Total: 0.4 mg/dL (ref 0.0–1.2)
CO2: 25 mmol/L (ref 20–29)
Calcium: 9.6 mg/dL (ref 8.7–10.2)
Chloride: 105 mmol/L (ref 96–106)
Creatinine, Ser: 0.79 mg/dL (ref 0.57–1.00)
Globulin, Total: 2.4 g/dL (ref 1.5–4.5)
Glucose: 109 mg/dL — ABNORMAL HIGH (ref 70–99)
Potassium: 4.9 mmol/L (ref 3.5–5.2)
Sodium: 142 mmol/L (ref 134–144)
Total Protein: 6.9 g/dL (ref 6.0–8.5)
eGFR: 92 mL/min/{1.73_m2} (ref >59)

## 2022-10-17 LAB — MICROALBUMIN / CREATININE URINE RATIO
Creatinine, Urine: 120.3 mg/dL
Microalb/Creat Ratio: 3 mg/g creat (ref 0–29)
Microalbumin, Urine: 4.1 ug/mL

## 2022-10-17 LAB — VITAMIN D 25 HYDROXY (VIT D DEFICIENCY, FRACTURES): Vit D, 25-Hydroxy: 46.1 ng/mL (ref 30.0–100.0)

## 2022-10-17 LAB — LIPID PANEL
Chol/HDL Ratio: 3.2 ratio (ref 0.0–4.4)
Cholesterol, Total: 181 mg/dL (ref 100–199)
HDL: 57 mg/dL (ref >39)
LDL Chol Calc (NIH): 104 mg/dL — ABNORMAL HIGH (ref 0–99)
Triglycerides: 109 mg/dL (ref 0–149)
VLDL Cholesterol Cal: 20 mg/dL (ref 5–40)

## 2022-10-17 LAB — T4, FREE: Free T4: 1.08 ng/dL (ref 0.82–1.77)

## 2022-10-17 LAB — VITAMIN B12: Vitamin B-12: 504 pg/mL (ref 232–1245)

## 2022-10-17 LAB — TSH: TSH: 0.926 u[IU]/mL (ref 0.450–4.500)

## 2022-10-17 MED ORDER — DAPSONE 5 % EX GEL: CUTANEOUS | 12 refills | Status: DC

## 2022-10-22 ENCOUNTER — Ambulatory Visit: Payer: BC Managed Care – PPO

## 2022-10-23 ENCOUNTER — Encounter: Payer: Self-pay | Admitting: Family Medicine

## 2022-10-23 DIAGNOSIS — G500 Trigeminal neuralgia: Secondary | ICD-10-CM

## 2022-10-23 DIAGNOSIS — B0222 Postherpetic trigeminal neuralgia: Secondary | ICD-10-CM

## 2022-10-23 NOTE — Telephone Encounter (Signed)
Can you give me more information on Emgality?  She is worried about one of the ingredients in it

## 2022-11-07 ENCOUNTER — Other Ambulatory Visit: Payer: Self-pay | Admitting: Family Medicine

## 2022-11-07 DIAGNOSIS — E039 Hypothyroidism, unspecified: Secondary | ICD-10-CM

## 2022-11-13 ENCOUNTER — Other Ambulatory Visit: Payer: BC Managed Care – PPO

## 2023-02-03 ENCOUNTER — Other Ambulatory Visit: Payer: Self-pay

## 2023-02-03 ENCOUNTER — Emergency Department (HOSPITAL_BASED_OUTPATIENT_CLINIC_OR_DEPARTMENT_OTHER)
Admission: EM | Admit: 2023-02-03 | Discharge: 2023-02-03 | Disposition: A | Discharge status: Home / Self Care | Payer: BC Managed Care – PPO | Attending: Emergency Medicine | Admitting: Emergency Medicine

## 2023-02-03 ENCOUNTER — Encounter (HOSPITAL_BASED_OUTPATIENT_CLINIC_OR_DEPARTMENT_OTHER): Payer: Self-pay

## 2023-02-03 ENCOUNTER — Ambulatory Visit (INDEPENDENT_AMBULATORY_CARE_PROVIDER_SITE_OTHER): Payer: BC Managed Care – PPO | Admitting: Nurse Practitioner

## 2023-02-03 ENCOUNTER — Encounter: Payer: Self-pay | Admitting: Nurse Practitioner

## 2023-02-03 VITALS — BP 106/73 | HR 79 | Temp 97.0°F | Ht 62.0 in | Wt 234.6 lb

## 2023-02-03 DIAGNOSIS — W5501XA Bitten by cat, initial encounter: Secondary | ICD-10-CM | POA: Diagnosis not present

## 2023-02-03 DIAGNOSIS — S51851A Open bite of right forearm, initial encounter: Secondary | ICD-10-CM | POA: Insufficient documentation

## 2023-02-03 DIAGNOSIS — Z2914 Encounter for prophylactic rabies immune globin: Secondary | ICD-10-CM | POA: Diagnosis not present

## 2023-02-03 DIAGNOSIS — T148XXA Other injury of unspecified body region, initial encounter: Secondary | ICD-10-CM

## 2023-02-03 DIAGNOSIS — L03113 Cellulitis of right upper limb: Secondary | ICD-10-CM | POA: Insufficient documentation

## 2023-02-03 DIAGNOSIS — S61250A Open bite of right index finger without damage to nail, initial encounter: Secondary | ICD-10-CM

## 2023-02-03 DIAGNOSIS — Z23 Encounter for immunization: Secondary | ICD-10-CM

## 2023-02-03 DIAGNOSIS — Z203 Contact with and (suspected) exposure to rabies: Secondary | ICD-10-CM | POA: Insufficient documentation

## 2023-02-03 DIAGNOSIS — S61451A Open bite of right hand, initial encounter: Secondary | ICD-10-CM | POA: Diagnosis not present

## 2023-02-03 DIAGNOSIS — S59911A Unspecified injury of right forearm, initial encounter: Secondary | ICD-10-CM | POA: Diagnosis present

## 2023-02-03 DIAGNOSIS — S61259A Open bite of unspecified finger without damage to nail, initial encounter: Secondary | ICD-10-CM | POA: Insufficient documentation

## 2023-02-03 LAB — PREGNANCY, URINE: Preg Test, Ur: NEGATIVE

## 2023-02-03 MED ORDER — AMOXICILLIN-POT CLAVULANATE 875-125 MG PO TABS: 1.0000 | ORAL_TABLET | Freq: Two times a day (BID) | ORAL | 0 refills | Status: DC

## 2023-02-03 MED ORDER — RABIES IMMUNE GLOBULIN 150 UNIT/ML IM INJ
20.0000 [IU]/kg | INJECTION | Freq: Once | INTRAMUSCULAR | Status: AC
  Administered 2023-02-03: 2100 [IU] via INTRAMUSCULAR
  Filled 2023-02-03: qty 2
  Filled 2023-02-03: qty 14

## 2023-02-03 MED ORDER — AMOXICILLIN-POT CLAVULANATE 875-125 MG PO TABS
1.0000 | ORAL_TABLET | Freq: Once | ORAL | Status: AC
  Administered 2023-02-03: 1 via ORAL
  Filled 2023-02-03: qty 1

## 2023-02-03 MED ORDER — RABIES VACCINE, PCEC IM SUSR
1.0000 mL | Freq: Once | INTRAMUSCULAR | Status: AC
  Administered 2023-02-03: 1 mL via INTRAMUSCULAR
  Filled 2023-02-03: qty 1

## 2023-02-03 MED ORDER — DOXYCYCLINE HYCLATE 100 MG PO CAPS
100.0000 mg | ORAL_CAPSULE | Freq: Two times a day (BID) | ORAL | 0 refills | Status: DC
Start: 2023-02-03 — End: 2023-04-26

## 2023-02-03 NOTE — ED Provider Notes (Signed)
[facility] EMERGENCY DEPARTMENT AT [facility] Provider Note   CSN: [id number] Arrival date & time: 02/03/23  2015     History  Chief Complaint  Patient presents with   Animal Bite    Christy Gay is a 47 y.o. female, no pertinent past medical history, who presents to the ED secondary to a cat bite, cat scratch that occurred about 2 days ago.  She states she was playing with a new cat, and was about to take her to bed, to get her shots, when she got scratched and bit by the cat.  The cat is a stray, not up-to-date on his immunizations.  States that since then, her hand has gotten swelling, and she has a red rash on it.  When to see her primary care doctor today and was prescribed doxycycline, and given a tetanus shot and instructed to follow-up with health department for rabies shot.  She states that the health department did not have rabies shots, thus she came here.  Denies any fevers, chills.    Home Medications Prior to Admission medications   Medication Sig Start Date End Date Taking? Authorizing Provider  amoxicillin-clavulanate (AUGMENTIN) 875-125 MG tablet Take 1 tablet by mouth every 12 (twelve) hours. 02/03/23  Yes Alaija Ruble L, PA  Dapsone 5 % topical gel APPLY PEA SIZED AMOUNT TO THE FACE TWICE DAILY FOR ACNE AS DIRECTED. 10/17/22   Delynn Flavin M, DO  doxycycline (VIBRAMYCIN) 100 MG capsule Take 1 capsule (100 mg total) by mouth 2 (two) times daily. 02/03/23   St Vena Austria, NP  levothyroxine (SYNTHROID) 50 MCG tablet TAKE 1 TABLET BY MOUTH EVERY DAY 11/09/22   Delynn Flavin M, DO  nitroGLYCERIN (NITROSTAT) 0.4 MG SL tablet Place under the tongue as needed.    [provider]  rosuvastatin (CRESTOR) 10 MG tablet Take 10 mg by mouth daily. 08/15/22   [provider]  tiZANidine (ZANAFLEX) 4 MG tablet Take 4 mg by mouth 2 (two) times daily as needed. 06/08/22   [provider]  venlafaxine XR (EFFEXOR-XR) 150 MG 24 hr  capsule Take 75 mg by mouth daily with breakfast.    [provider]  zonisamide (ZONEGRAN) 25 MG capsule Take 25 mg by mouth daily.    [provider]      Allergies    Polyethylene glycol, Ozempic (0.25 or 0.5 mg-dose) [semaglutide(0.25 or 0.5mg -dos)], Covid-19 (subunit) vaccine, Jardiance [empagliflozin], and Keflex [cephalexin]    Review of Systems   Review of Systems  Constitutional:  Negative for fever.  Skin:  Positive for wound.    Physical Exam Updated Vital Signs BP 114/76 (BP Location: Right Arm)   Pulse 74   Temp 97.6 F (36.4 C)   Resp 18   Ht 5\' 2"  (1.575 m)   Wt 106.4 kg   SpO2 96%   BMI 42.90 kg/m  Physical Exam Vitals and nursing note reviewed.  Constitutional:      General: She is not in acute distress.    Appearance: She is well-developed.  HENT:     Head: Normocephalic and atraumatic.  Eyes:     General:        Right eye: No discharge.        Left eye: No discharge.     Conjunctiva/sclera: Conjunctivae normal.  Pulmonary:     Effort: No respiratory distress.  Skin:    General: Skin is warm.     Capillary Refill: Capillary refill takes less than  2 seconds.     Comments: Bites to the right anterior forearm, as well as right hand.  Swelling of the second digit, of the entire phalanx.  Erythema overlying.  Able to flex and extend all digits and joints.  No snuffbox tenderness to palpation.  No fluctuant mass.  Swelling of the anterior forearm as well with overlying erythema  Neurological:     Mental Status: She is alert.     Comments: Clear speech.   Psychiatric:        Behavior: Behavior normal.        Thought Content: Thought content normal.     ED Results / Procedures / Treatments   Labs (all labs ordered are listed, but only abnormal results are displayed) Labs Reviewed  PREGNANCY, URINE    EKG None  Radiology No results found.  Procedures Procedures    Medications Ordered in ED Medications  rabies immune  globulin (HYPERRAB/KEDRAB) injection 2,100 Units (2,100 Units Intramuscular Given 02/03/23 2330)  rabies vaccine (RABAVERT) injection 1 mL (1 mL Intramuscular Given 02/03/23 2324)  amoxicillin-clavulanate (AUGMENTIN) 875-125 MG per tablet 1 tablet (1 tablet Oral Given 02/03/23 2324)    ED Course/ Medical Decision Making/ A&P                                 Medical Decision Making Patient is a 47 year old female, here for cat scratch, and bite, that occurred about 2 days ago, she states since then her arm and hand has become red, swollen and tender to the touch.  Was started on doxycycline by her primary care doctor, and told [facility], for her rabies vaccines.  She is requesting rabies shot, as animal is unvaccinated.  She does appear to have cellulitis, but she has good range of motion, especially digits, even the one digit is more swollen.  No evidence of any kind of abscesses or drainable areas.  We will augment her doxycycline with an Augmentin, given the animal bite, rabies vaccine ordered, and we discussed strict return precautions.  She also will had a tetanus shot done earlier this morning, thus up to date.    Amount and/or Complexity of Data Reviewed Labs: ordered.  Risk Prescription drug management.    Final Clinical Impression(s) / ED Diagnoses Final diagnoses:  Animal bite  Cellulitis of right upper extremity    Rx / DC Orders ED Discharge Orders          Ordered    amoxicillin-clavulanate (AUGMENTIN) 875-125 MG tablet  Every 12 hours        02/03/23 2335              Jaymie Mckiddy L, PA 02/03/23 2341    Rozelle Logan, DO 02/04/23 0001

## 2023-02-03 NOTE — Addendum Note (Signed)
Addended by: Daisy Blossom on: 02/03/2023 11:17 AM   Modules accepted: Orders

## 2023-02-03 NOTE — Progress Notes (Addendum)
Acute Office Visit  Subjective:     Patient ID: Christy Gay, female    DOB: [birth year], 47 y.o.   MRN: [id number]  Chief Complaint  Patient presents with   Animal Bite    Cat bite yesterday morning on right hand    HPI Cat Bite Initial Encounter Christy Gay is a 47 year old female who presents with a cat bite to the right index finger. The bite occurred while attempting to catch a stray cat that has been in her house since May for spaying. The patient reports that the cat was biting during the capture process. She has not had her tetanus vaccination updated and denies any fever, chills, or shortness of breath.  No fever, chills, N/V, SOB chest pain  Active Ambulatory Problems    Diagnosis Date Noted   Normal pregnancy in multigravida 10/11/2013   S/P cesarean section 10/12/2013   Ankle pain 09/13/2017   Obesity 01/15/2020   Premenstrual tension syndrome 04/03/2014   Precordial chest pain 01/30/2020   Dizziness 10/30/2020   SOB (shortness of breath) 10/30/2020   Trigeminal neuralgia 05/09/2021   Diabetes mellitus (HCC) 10/16/2022   Hyperlipidemia associated with type 2 diabetes mellitus (HCC) 10/16/2022   Cat bite of finger 02/03/2023   Resolved Ambulatory Problems    Diagnosis Date Noted   Educated about COVID-19 virus infection 01/30/2020   Past Medical History:  Diagnosis Date   Arthritis    Chronic kidney disease    Diabetes mellitus without complication (HCC)    Headache(784.0)    Normal pregnancy, repeat 10/11/2013    Review of Systems  Constitutional:  Negative for chills, fever and malaise/fatigue.  HENT:  Negative for congestion and sore throat.   Respiratory:  Negative for cough and shortness of breath.   Cardiovascular:  Negative for chest pain and leg swelling.  Gastrointestinal:  Negative for nausea and vomiting.  Genitourinary:  Negative for frequency and urgency.  Musculoskeletal:  Negative for back pain and myalgias.  Skin:  Negative for  itching and rash.  Neurological:  Negative for dizziness, weakness and headaches.  Psychiatric/Behavioral:  Positive for depression. Negative for hallucinations. The patient does not have insomnia.        On medication   Negative unless indicated in HPI    Objective:    BP 106/73   Pulse 79   Temp (!) 97 F (36.1 C) (Temporal)   Ht 5\' 2"  (1.575 m)   Wt 234 lb 9.6 oz (106.4 kg)   SpO2 99%   BMI 42.91 kg/m  BP Readings from Last 3 Encounters:  02/03/23 106/73  10/16/22 117/81  09/17/21 125/84   Wt Readings from Last 3 Encounters:  02/03/23 234 lb 9.6 oz (106.4 kg)  10/16/22 237 lb (107.5 kg)  09/17/21 260 lb 9.6 oz (118.2 kg)      Physical Exam Vitals and nursing note reviewed.  Constitutional:      Appearance: Normal appearance. She is obese.  HENT:     Head: Normocephalic and atraumatic.  Eyes:     Extraocular Movements: Extraocular movements intact.     Conjunctiva/sclera: Conjunctivae normal.     Pupils: Pupils are equal, round, and reactive to light.  Cardiovascular:     Rate and Rhythm: Normal rate and regular rhythm.  Pulmonary:     Effort: Pulmonary effort is normal.     Breath sounds: Normal breath sounds.  Musculoskeletal:        General: Normal range of motion.  Cervical back: Normal range of motion and neck supple. No rigidity or tenderness.     Right lower leg: No edema.     Left lower leg: No edema.  Skin:    General: Skin is warm.     Findings: Erythema and wound present.     Comments: Right index finger from cat bite  Neurological:     Mental Status: She is alert and oriented to person, place, and time. Mental status is at baseline.    No results found for any visits on 02/03/23.     Assessment & Plan:  Cat bite of finger, initial encounter -     Doxycycline Hyclate; Take 1 capsule (100 mg total) by mouth 2 (two) times daily.  Dispense: 14 capsule; Refill: 0   Delrae was seen today for cat bite Tetanus administered Advise to go to  the help department for Rabies vac series since we don't have it in stock at te clinic, agree to go later after work Tylenol for pain/fever She understands that the incident will reported and animal control may come and get the cat.  Encourage healthy lifestyle choices, including diet (rich in fruits, vegetables, and lean proteins, and low in salt and simple carbohydrates) and exercise (at least 30 minutes of moderate physical activity daily).     The above assessment and management plan was discussed with the patient. The patient verbalized understanding of and has agreed to the management plan. Patient is aware to call the clinic if they develop any new symptoms or if symptoms persist or worsen. Patient is aware when to return to the clinic for a follow-up visit. Patient educated on when it is appropriate to go to the emergency department.  Return if symptoms worsen or fail to improve.  Arrie Aran Santa Lighter, DNP [facility] [address] [place], [place] [address] [phone]

## 2023-02-03 NOTE — ED Triage Notes (Signed)
Pov from home, A&O x 4, gcs 15, amb to triage  Bit by cat on right index finger yesterday, sts recommended to get rabies due to cat not being vaccinated.

## 2023-02-03 NOTE — Discharge Instructions (Signed)
Please follow-up with your primary care doctor, for further evaluation.  I do believe that you are developing cellulitis from the animal bite, take the Augmentin, as prescribed, and you can also take the doxycycline, for concomitant infection control.  Your doxycycline was prescribed by your primary care doctor.  Additionally you are up-to-date on your tetanus shot as you inform me that you have got it today.  You will need to return to the health department, or an urgent care, for your further rabies vaccines.  I provided you information, for your follow-up.  If your redness, swelling becomes worse return to the ER immediately.                                   RABIES VACCINE FOLLOW UP  Patient's Name: Christy Gay                     Original Order Date:02/03/2023  Medical Record Number: [id number]  ED Physician: Rozelle Logan, DO Primary Diagnosis: Rabies Exposure       PCP: [name], DO  Patient Phone Number: (home) [phone] (home)    (cell)  Telephone Information:  Mobile [phone]    (work) There is no work phone number on file. Species of Animal: Cat   You have been seen in the Emergency Department for a possible rabies exposure. It's very important you return for the additional vaccine doses.  Please call the clinic listed below for hours of operation.   Clinic that will administer your rabies vaccines: Other (Comment)  DAY 0:  02/03/2023      DAY 3:  02/06/2023       DAY 7:  02/10/2023     DAY 14:  02/17/2023         The 5th vaccine injection is considered for immune compromised patients only.  DAY 28:  03/03/2023

## 2023-02-04 ENCOUNTER — Telehealth: Payer: Self-pay | Admitting: Family Medicine

## 2023-02-06 ENCOUNTER — Encounter (HOSPITAL_BASED_OUTPATIENT_CLINIC_OR_DEPARTMENT_OTHER): Payer: Self-pay

## 2023-02-06 ENCOUNTER — Emergency Department (HOSPITAL_BASED_OUTPATIENT_CLINIC_OR_DEPARTMENT_OTHER)
Admission: EM | Admit: 2023-02-06 | Discharge: 2023-02-06 | Disposition: A | Discharge status: Home / Self Care | Payer: BC Managed Care – PPO | Attending: Emergency Medicine | Admitting: Emergency Medicine

## 2023-02-06 ENCOUNTER — Other Ambulatory Visit: Payer: Self-pay

## 2023-02-06 DIAGNOSIS — Z23 Encounter for immunization: Secondary | ICD-10-CM | POA: Diagnosis not present

## 2023-02-06 DIAGNOSIS — S61451A Open bite of right hand, initial encounter: Secondary | ICD-10-CM | POA: Insufficient documentation

## 2023-02-06 DIAGNOSIS — T148XXA Other injury of unspecified body region, initial encounter: Secondary | ICD-10-CM

## 2023-02-06 DIAGNOSIS — S51851A Open bite of right forearm, initial encounter: Secondary | ICD-10-CM | POA: Insufficient documentation

## 2023-02-06 DIAGNOSIS — E119 Type 2 diabetes mellitus without complications: Secondary | ICD-10-CM | POA: Diagnosis not present

## 2023-02-06 DIAGNOSIS — W5501XA Bitten by cat, initial encounter: Secondary | ICD-10-CM | POA: Insufficient documentation

## 2023-02-06 MED ORDER — RABIES VACCINE, PCEC IM SUSR
1.0000 mL | Freq: Once | INTRAMUSCULAR | Status: AC
  Administered 2023-02-06: 1 mL via INTRAMUSCULAR
  Filled 2023-02-06: qty 1

## 2023-02-06 NOTE — ED Provider Notes (Addendum)
[facility] EMERGENCY DE[facility]MENT AT [facility] Provider Note   CSN: [id number] Arrival date & time: 02/06/23  1847     History  Chief Complaint  Patient presents with   Rabies Vaccine    Christy Gay is a 47 y.o. female diabetes, trigeminal neuralgia, cat bite of finger presents today for her second dose of the rabies series.  She denies fever, chills, shortness of breath.  She endorses pain with flexion.  She is currently taking doxycycline for infection prevention.  HPI     Home Medications Prior to Admission medications   Medication Sig Start Date End Date Taking? Authorizing Provider  amoxicillin-clavulanate (AUGMENTIN) 875-125 MG tablet Take 1 tablet by mouth every 12 (twelve) hours. 02/03/23   Small, Brooke L, PA  Dapsone 5 % topical gel APPLY PEA SIZED AMOUNT TO THE FACE TWICE DAILY FOR ACNE AS DIRECTED. 10/17/22   Delynn Flavin M, DO  doxycycline (VIBRAMYCIN) 100 MG capsule Take 1 capsule (100 mg total) by mouth 2 (two) times daily. 02/03/23   St Vena Austria, NP  levothyroxine (SYNTHROID) 50 MCG tablet TAKE 1 TABLET BY MOUTH EVERY DAY 11/09/22   Delynn Flavin M, DO  nitroGLYCERIN (NITROSTAT) 0.4 MG SL tablet Place under the tongue as needed.    [provider]  rosuvastatin (CRESTOR) 10 MG tablet Take 10 mg by mouth daily. 08/15/22   [provider]  tiZANidine (ZANAFLEX) 4 MG tablet Take 4 mg by mouth 2 (two) times daily as needed. 06/08/22   [provider]  venlafaxine XR (EFFEXOR-XR) 150 MG 24 hr capsule Take 75 mg by mouth daily with breakfast.    [provider]  zonisamide (ZONEGRAN) 25 MG capsule Take 25 mg by mouth daily.    [provider]      Allergies    Polyethylene glycol, Ozempic (0.25 or 0.5 mg-dose) [semaglutide(0.25 or 0.5mg -dos)], Covid-19 (subunit) vaccine, Jardiance [empagliflozin], and Keflex [cephalexin]    Review of Systems   Review of Systems  Constitutional:  Negative for  chills and fever.  Musculoskeletal:  Positive for arthralgias.  Skin:  Positive for wound.    Physical Exam Updated Vital Signs Temp 97.9 F (36.6 C) (Oral)   Ht 5\' 2"  (1.575 m)   Wt 102.1 kg   BMI 41.15 kg/m  Physical Exam Vitals and nursing note reviewed.  Constitutional:      General: She is not in acute distress.    Appearance: She is well-developed.  HENT:     Head: Normocephalic and atraumatic.  Eyes:     Conjunctiva/sclera: Conjunctivae normal.  Cardiovascular:     Rate and Rhythm: Normal rate and regular rhythm.     Heart sounds: No murmur heard. Pulmonary:     Effort: Pulmonary effort is normal. No respiratory distress.     Breath sounds: Normal breath sounds.  Abdominal:     General: There is no distension.     Palpations: Abdomen is soft.  Musculoskeletal:        General: Swelling and signs of injury present.     Cervical back: Neck supple.     Comments: Bite to right anterior forearm and right hand.  Swelling of the entire second digit with mild erythema. Able to flex and extend all digits and joints, although second digit has pain.  Skin:    General: Skin is warm and dry.     Capillary Refill: Capillary refill takes less than 2 seconds.     Findings: Erythema present.  Neurological:     Mental Status: She is alert.  Psychiatric:        Mood and Affect: Mood normal.     ED Results / Procedures / Treatments   Labs (all labs ordered are listed, but only abnormal results are displayed) Labs Reviewed - No data to display  EKG None  Radiology No results found.  Procedures Procedures    Medications Ordered in ED Medications  rabies vaccine (RABAVERT) injection 1 mL (has no administration in time range)    ED Course/ Medical Decision Making/ A&P                                 Medical Decision Making Risk Prescription drug management.   This patient presents to the ED with chief complaint(s) of rabies vaccine with pertinent past medical  history of cat bite which further complicates the presenting complaint. The complaint involves an extensive differential diagnosis and also carries with it a high risk of complications and morbidity.    The differential diagnosis includes animal bite  ED Course and Reassessment: Patient given second dose of rabies vaccine and monitored for vaccine reaction.  Consultation: - Consulted or discussed management/test interpretation w/ external professional: None  Consideration for admission or further workup: Patient advised to follow-up with urgent care to receive third and fourth vaccine according to schedule.        Final Clinical Impression(s) / ED Diagnoses Final diagnoses:  Animal bite    Rx / DC Orders ED Discharge Orders     None         Dolphus Jenny, PA-C 02/06/23 1928    Dolphus Jenny, PA-C 02/06/23 1928    Terald Sleeper, MD 02/06/23 2330

## 2023-02-06 NOTE — Discharge Instructions (Addendum)
Today you were given your second dose of the rabies series.  Please return on day 7 and day 14 to complete your vaccination series.  Thank you for letting us treat you today. Return to the Emergency Room if pain becomes severe or symptoms worsen.  If you have worsening symptoms please follow-up with hand surgery.

## 2023-02-06 NOTE — ED Triage Notes (Signed)
Pt presents for a follow up to receive her 2nd rabies vaccine in the series. The 1st one was admin on 10/2.

## 2023-02-08 NOTE — Telephone Encounter (Signed)
Left detailed message.   

## 2023-02-08 NOTE — Telephone Encounter (Signed)
The health department ought to be able to direct her where she can go.  Perhaps the ER has them?  I'm not really sure. I would def call the HD and see where they recommend.  Where did she get the first?

## 2023-02-10 ENCOUNTER — Encounter (HOSPITAL_BASED_OUTPATIENT_CLINIC_OR_DEPARTMENT_OTHER): Payer: Self-pay

## 2023-02-10 ENCOUNTER — Other Ambulatory Visit: Payer: Self-pay

## 2023-02-10 ENCOUNTER — Emergency Department (HOSPITAL_BASED_OUTPATIENT_CLINIC_OR_DEPARTMENT_OTHER)
Admission: EM | Admit: 2023-02-10 | Discharge: 2023-02-10 | Disposition: A | Discharge status: Home / Self Care | Payer: BC Managed Care – PPO | Attending: Emergency Medicine | Admitting: Emergency Medicine

## 2023-02-10 DIAGNOSIS — W5501XD Bitten by cat, subsequent encounter: Secondary | ICD-10-CM | POA: Insufficient documentation

## 2023-02-10 DIAGNOSIS — Z2914 Encounter for prophylactic rabies immune globin: Secondary | ICD-10-CM | POA: Insufficient documentation

## 2023-02-10 DIAGNOSIS — Z23 Encounter for immunization: Secondary | ICD-10-CM | POA: Diagnosis not present

## 2023-02-10 DIAGNOSIS — Z79899 Other long term (current) drug therapy: Secondary | ICD-10-CM | POA: Insufficient documentation

## 2023-02-10 DIAGNOSIS — E119 Type 2 diabetes mellitus without complications: Secondary | ICD-10-CM | POA: Diagnosis not present

## 2023-02-10 DIAGNOSIS — S61250D Open bite of right index finger without damage to nail, subsequent encounter: Secondary | ICD-10-CM | POA: Insufficient documentation

## 2023-02-10 MED ORDER — RABIES VACCINE, PCEC IM SUSR
1.0000 mL | Freq: Once | INTRAMUSCULAR | Status: AC
  Administered 2023-02-10: 1 mL via INTRAMUSCULAR
  Filled 2023-02-10: qty 1

## 2023-02-10 NOTE — ED Provider Notes (Signed)
[place] EMERGENCY DEPARTMENT AT [facility] Provider Note   CSN: [id number] Arrival date & time: 02/10/23  1627     History  Chief Complaint  Patient presents with   Rabies Injection    Christy Gay is a 47 y.o. female with a past medical history of diabetes, trigeminal neuralgia, cat bite of right index finger presents today for third dose of rabies series.  She reports that she has some pain with flexion of digit but is able to flex more than originally.  She is also taking Doxy for infection coverage. She denies fever, chills, shortness of breath.   HPI     Home Medications Prior to Admission medications   Medication Sig Start Date End Date Taking? Authorizing Provider  amoxicillin-clavulanate (AUGMENTIN) 875-125 MG tablet Take 1 tablet by mouth every 12 (twelve) hours. 02/03/23   Small, Brooke L, PA  Dapsone 5 % topical gel APPLY PEA SIZED AMOUNT TO THE FACE TWICE DAILY FOR ACNE AS DIRECTED. 10/17/22   Delynn Flavin M, DO  doxycycline (VIBRAMYCIN) 100 MG capsule Take 1 capsule (100 mg total) by mouth 2 (two) times daily. 02/03/23   St Vena Austria, NP  levothyroxine (SYNTHROID) 50 MCG tablet TAKE 1 TABLET BY MOUTH EVERY DAY 11/09/22   Delynn Flavin M, DO  nitroGLYCERIN (NITROSTAT) 0.4 MG SL tablet Place under the tongue as needed.    [provider]  rosuvastatin (CRESTOR) 10 MG tablet Take 10 mg by mouth daily. 08/15/22   [provider]  tiZANidine (ZANAFLEX) 4 MG tablet Take 4 mg by mouth 2 (two) times daily as needed. 06/08/22   [provider]  venlafaxine XR (EFFEXOR-XR) 150 MG 24 hr capsule Take 75 mg by mouth daily with breakfast.    [provider]  zonisamide (ZONEGRAN) 25 MG capsule Take 25 mg by mouth daily.    [provider]      Allergies    Polyethylene glycol, Ozempic (0.25 or 0.5 mg-dose) [semaglutide(0.25 or 0.5mg -dos)], Covid-19 (subunit) vaccine, Jardiance [empagliflozin], and Keflex  [cephalexin]    Review of Systems   Review of Systems  Constitutional:  Negative for chills, fatigue and fever.  Respiratory:  Negative for cough, chest tightness, shortness of breath and wheezing.   Cardiovascular:  Negative for chest pain and palpitations.  Gastrointestinal:  Negative for abdominal pain, constipation, diarrhea, nausea and vomiting.  Neurological:  Negative for dizziness, seizures, weakness, light-headedness, numbness and headaches.    Physical Exam Updated Vital Signs BP (!) 131/90 (BP Location: Right Arm)   Pulse 87   Temp (!) 97.5 F (36.4 C)   Resp 17   SpO2 98%  Physical Exam Vitals and nursing note reviewed.  Constitutional:      General: She is not in acute distress.    Appearance: Normal appearance.  HENT:     Head: Normocephalic and atraumatic.  Eyes:     Conjunctiva/sclera: Conjunctivae normal.  Cardiovascular:     Rate and Rhythm: Normal rate.  Pulmonary:     Effort: Pulmonary effort is normal. No respiratory distress.  Skin:    Coloration: Skin is not jaundiced or pale.     Comments: Skin of right index finger seems to be healing appropriately No erythema, redness, warmth, swelling to the digit (see media)  Neurological:     Mental Status: She is alert. Mental status is at baseline.     ED Results / Procedures / Treatments   Labs (all labs ordered are listed, but only  abnormal results are displayed) Labs Reviewed - No data to display  EKG None  Radiology No results found.  Procedures Procedures    Medications Ordered in ED Medications  rabies vaccine (RABAVERT) injection 1 mL (1 mL Intramuscular Given 02/10/23 1741)    ED Course/ Medical Decision Making/ A&P                                 Medical Decision Making    Patient presents to the ED for 3 of 4 dose of rabies vaccine.  Co morbidities that complicate the patient evaluation  None   Additional history obtained:  Additional history obtained from Nursing  and Outside Medical Records   External records from outside source obtained upon chart review    Medicines ordered and prescription drug management:  I ordered medication including rabies vaccine for dose 3 of 4 Reevaluation of the patient after these medicines showed that the patient improved I have reviewed the patients home medicines and have made adjustments as needed    Problem List / ED Course:  Cat bite, subsequent encounter   Reevaluation:  After the interventions noted above, I reevaluated the patient and found that they have :improved    Dispostion:  Patient is resting comfortably in chair.  She does not appear physical signs of distress.  She speaks in full and complete sentences.  See HPI.  Upon assessment, there is no erythema, warmth, swelling of right index finger.  She reports that she is able to fully flex finger difficulty now.  She reports that she has been compliant with doxycycline for infection prophylaxis.  Denies fever.  Patient is to return in 7 days (02/17/2023) for fourth and final rabies vaccination following unvaccinated cat bite.  Discussed disposition and return precautions to patient expresses understanding agrees with plan.         Final Clinical Impression(s) / ED Diagnoses Final diagnoses:  Cat bite, subsequent encounter    Rx / DC Orders ED Discharge Orders     None         Judithann Sheen, PA 02/10/23 1752    Benjiman Core, MD 02/10/23 2310

## 2023-02-10 NOTE — Discharge Instructions (Addendum)
Thank you for letting us keep you your third of fourth rabies injection.  Please return on 10/16 for your final rabies vaccine.  Return to emergency department if you experience worsening swelling, redness, warmth, pain to finger, worsening with flexion of the joint

## 2023-02-10 NOTE — ED Triage Notes (Signed)
Patient arrives via POV from home for repeat rabies vaccine injection. Patient is getting her 3rd vaccine. Reports minimal pain site.

## 2023-02-17 ENCOUNTER — Encounter (HOSPITAL_BASED_OUTPATIENT_CLINIC_OR_DEPARTMENT_OTHER): Payer: Self-pay | Admitting: Emergency Medicine

## 2023-02-17 ENCOUNTER — Other Ambulatory Visit: Payer: Self-pay

## 2023-02-17 ENCOUNTER — Emergency Department (HOSPITAL_BASED_OUTPATIENT_CLINIC_OR_DEPARTMENT_OTHER)
Admission: EM | Admit: 2023-02-17 | Discharge: 2023-02-17 | Disposition: A | Discharge status: Home / Self Care | Payer: BC Managed Care – PPO | Attending: Emergency Medicine | Admitting: Emergency Medicine

## 2023-02-17 DIAGNOSIS — Z203 Contact with and (suspected) exposure to rabies: Secondary | ICD-10-CM | POA: Insufficient documentation

## 2023-02-17 DIAGNOSIS — Z23 Encounter for immunization: Secondary | ICD-10-CM | POA: Insufficient documentation

## 2023-02-17 DIAGNOSIS — S61250S Open bite of right index finger without damage to nail, sequela: Secondary | ICD-10-CM | POA: Diagnosis present

## 2023-02-17 DIAGNOSIS — E119 Type 2 diabetes mellitus without complications: Secondary | ICD-10-CM | POA: Diagnosis not present

## 2023-02-17 DIAGNOSIS — W5501XS Bitten by cat, sequela: Secondary | ICD-10-CM | POA: Diagnosis not present

## 2023-02-17 DIAGNOSIS — S61250D Open bite of right index finger without damage to nail, subsequent encounter: Secondary | ICD-10-CM | POA: Diagnosis present

## 2023-02-17 MED ORDER — RABIES VACCINE, PCEC IM SUSR
1.0000 mL | Freq: Once | INTRAMUSCULAR | Status: AC
  Administered 2023-02-17: 1 mL via INTRAMUSCULAR
  Filled 2023-02-17: qty 1

## 2023-02-17 NOTE — ED Provider Notes (Signed)
[place] EMERGENCY DEPARTMENT AT [facility] Provider Note   CSN: [id number] Arrival date & time: 02/17/23  1704     History  No chief complaint on file.   Christy Gay is a 47 y.o. female with a history of diabetes mellitus, hyperlipidemia, and obesity who presents the ED today for rabies vaccine.  Patient was bit by a cat on the right index finger on 02/03/2023 and was started on cyclic doxycycline as well as given the rabies vaccination and immunoglobulin.  She is here today for her fourth and final shot.  She reports some stiffness with movement but denies swelling, fevers, weakness, or loss of sensation.  Denies any other complaints or concerns at this time.    Home Medications Prior to Admission medications   Medication Sig Start Date End Date Taking? Authorizing Provider  amoxicillin-clavulanate (AUGMENTIN) 875-125 MG tablet Take 1 tablet by mouth every 12 (twelve) hours. 02/03/23   Small, Brooke L, PA  Dapsone 5 % topical gel APPLY PEA SIZED AMOUNT TO THE FACE TWICE DAILY FOR ACNE AS DIRECTED. 10/17/22   Delynn Flavin M, DO  doxycycline (VIBRAMYCIN) 100 MG capsule Take 1 capsule (100 mg total) by mouth 2 (two) times daily. 02/03/23   St Vena [place], NP  levothyroxine (SYNTHROID) 50 MCG tablet TAKE 1 TABLET BY MOUTH EVERY DAY 11/09/22   Delynn Flavin M, DO  nitroGLYCERIN (NITROSTAT) 0.4 MG SL tablet Place under the tongue as needed.    [provider]  rosuvastatin (CRESTOR) 10 MG tablet Take 10 mg by mouth daily. 08/15/22   [provider]  tiZANidine (ZANAFLEX) 4 MG tablet Take 4 mg by mouth 2 (two) times daily as needed. 06/08/22   [provider]  venlafaxine XR (EFFEXOR-XR) 150 MG 24 hr capsule Take 75 mg by mouth daily with breakfast.    [provider]  zonisamide (ZONEGRAN) 25 MG capsule Take 25 mg by mouth daily.    [provider]      Allergies    Polyethylene glycol, Ozempic (0.25 or 0.5 mg-dose)  [semaglutide(0.25 or 0.5mg -dos)], Covid-19 (subunit) vaccine, Jardiance [empagliflozin], and Keflex [cephalexin]    Review of Systems   Review of Systems  Constitutional:        Needs rabies vaccine  All other systems reviewed and are negative.   Physical Exam Updated Vital Signs BP 116/81   Pulse 66   Temp 98 F (36.7 C)   Resp 18   SpO2 98%  Physical Exam Vitals and nursing note reviewed.  Constitutional:      General: She is not in acute distress.    Appearance: Normal appearance.  HENT:     Head: Normocephalic and atraumatic.     Mouth/Throat:     Mouth: Mucous membranes are moist.  Eyes:     Conjunctiva/sclera: Conjunctivae normal.     Pupils: Pupils are equal, round, and reactive to light.  Cardiovascular:     Rate and Rhythm: Normal rate and regular rhythm.     Pulses: Normal pulses.     Heart sounds: Normal heart sounds.  Pulmonary:     Effort: Pulmonary effort is normal.     Breath sounds: Normal breath sounds.  Musculoskeletal:        General: Tenderness present. No swelling.     Comments: Some tenderness to the PIP joint of the index finger of the right hand  Skin:    General: Skin is warm and dry.     Capillary Refill:  Capillary refill takes less than 2 seconds.     Findings: No rash.     Comments: Some peeling skin around the palmar PIP of patient's right index finger.  No erythema or swelling.  Neurological:     General: No focal deficit present.     Mental Status: She is alert.     Sensory: No sensory deficit.     Motor: No weakness.  Psychiatric:        Mood and Affect: Mood normal.        Behavior: Behavior normal.    ED Results / Procedures / Treatments   Labs (all labs ordered are listed, but only abnormal results are displayed) Labs Reviewed - No data to display  EKG None  Radiology No results found.  Procedures Procedures: not indicated.   Medications Ordered in ED Medications  rabies vaccine (RABAVERT) injection 1 mL (1 mL  Intramuscular Given 02/17/23 2040)    ED Course/ Medical Decision Making/ A&P                                 Medical Decision Making Risk Prescription drug management.   This patient presents to the ED for concern of rabies, this involves an extensive number of treatment options, and is a complaint that carries with it a high risk of complications and morbidity.   Differential diagnosis includes: Fourth rabies vaccine, etc.   Comorbidities  See HPI above   Additional History  Additional history obtained from previous ED notes.   Lab Tests  Not indicated.   Imaging Studies  Not indicated.   Problem List / ED Course / Critical Interventions / Medication Management  Needs fourth rabies vaccine after cat bite I ordered medications including: Rabies vaccine I have reviewed the patients home medicines and have made adjustments as needed. Right index finger healing well with no swelling, erythema, or discharge.   Social Determinants of Health  Housing   Test / Admission - Considered  Patient is hemodynamically stable and safe for discharge home. Return precautions provided.       Final Clinical Impression(s) / ED Diagnoses Final diagnoses:  Cat bite, sequela    Rx / DC Orders ED Discharge Orders     None         Maxwell Marion, PA-C 02/17/23 2055    Royanne Foots, DO 02/22/23 [phone]

## 2023-02-17 NOTE — Discharge Instructions (Signed)
Follow-up with your PCP for reevaluation in 1 week.  Get help right away if: You have a red streak going away from your wound. You have any of these coming from your wound: Non-clear fluid. More blood. Pus or a bad smell. You have trouble moving your injured area. You lose feeling (have numbness) or feel tingling anywhere on your body.

## 2023-02-17 NOTE — ED Triage Notes (Signed)
Needs 4th rabies shot

## 2023-02-17 NOTE — ED Notes (Signed)
Reviewed AVS/discharge instruction with patient. Time allotted for and all questions answered. Patient is agreeable for d/c and escorted to ed exit by staff.  

## 2023-03-03 ENCOUNTER — Encounter: Payer: Self-pay | Admitting: Family Medicine

## 2023-03-31 ENCOUNTER — Telehealth: Payer: Self-pay | Admitting: Family Medicine

## 2023-04-05 IMAGING — CT CT HEART MORP W/ CTA COR W/ SCORE W/ CA W/CM &/OR W/O CM
1 of 9 series · 3 of 20 positions shown, 4 images · IV contrast (APPLIED)
Comparison: Chest radiograph 09/16/2020.
COMPARISON: Chest radiograph 09/16/2020.

Addendum:
EXAM:
OVER-READ INTERPRETATION  CT CHEST

The following report is an over-read performed by radiologist Dr.
Nazareth Jumper [[email or web address]] on 11/12/2020. This over-read
does not include interpretation of cardiac or coronary anatomy or
pathology. The coronary CTA interpretation by the cardiologist is
attached.
CLINICAL DATA: 45 yo female with dyspnea and chest pain
Cardiac/Coronary CTA
TECHNIQUE: A non-contrast, gated CT scan was obtained with axial slices of 3 mm
through the heart for calcium scoring. Calcium scoring was performed
using the Agatston method. A 120 kV prospective, gated, contrast
cardiac scan was obtained. Gantry rotation speed was 250 msecs and
collimation was 0.6 mm. Two sublingual nitroglycerin tablets (0.8
mg) were given. The 3D data set was reconstructed in 5% intervals of
the 35-75% of the R-R cycle. Diastolic phases were analyzed on a
dedicated workstation using MPR, MIP, and VRT modes. The patient
received 95 cc of contrast.

[Series 8: best syst · axial · 0.39mm/px · z∈[-254,-150]mm · 3 of 263 slices shown, 4 images]
[im 1/263  vessel]
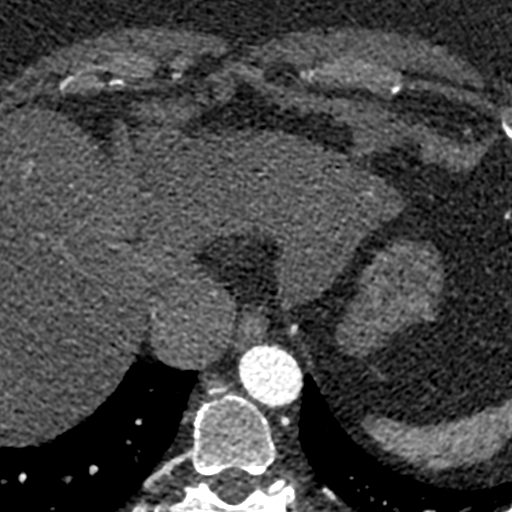
[im 1/263  lung]
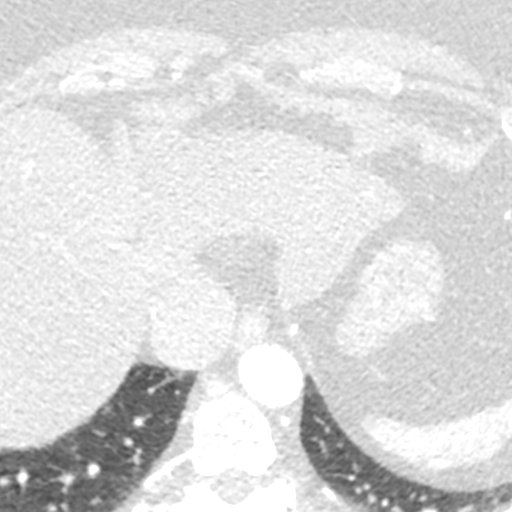
[im 132/263  vessel]
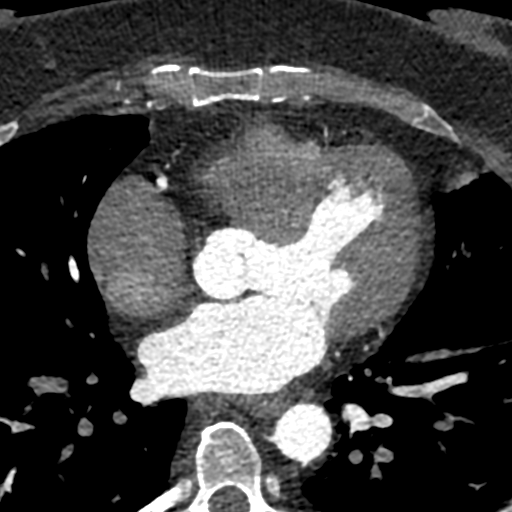
[im 263/263  vessel]
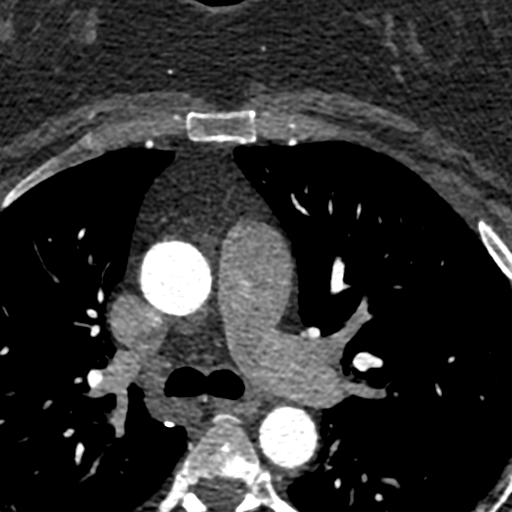

[3 of 20 positions shown; findings below may reference images not displayed]

FINDINGS: Vascular: Normal aortic caliber. Pulmonary arteries not well
opacified secondary to bolus timing.

Mediastinum/Nodes: No imaged thoracic adenopathy.

Lungs/Pleura: No pleural fluid.  Lingular scarring.

Upper Abdomen: Hepatic steatosis. Normal imaged portions of the
spleen, stomach.

Musculoskeletal: Left hemidiaphragm elevation. No acute osseous
abnormality.
IMPRESSION: No acute findings in the imaged extracardiac chest.

Hepatic steatosis.
FINDINGS: Image quality: Average.

Misregistration noted due to respiratory artifact

Coronary Arteries:  Normal coronary origin.  Right dominance.

Left main: The left main is a large caliber vessel with a normal
take off from the left coronary cusp that bifurcates to form a left
anterior descending artery and a left circumflex artery. There is no
plaque or stenosis.

Left anterior descending artery: The LAD is patent without evidence
of plaque or stenosis.

Left circumflex artery: The LCX is non-dominant and patent with no
evidence of plaque or stenosis. The LCX gives off 1 patent obtuse
marginal branches. Significant misregistration in OM.

Right coronary artery: The RCA is dominant with normal take off from
the right coronary cusp. There is no evidence of plaque or stenosis.
The RCA terminates as a PDA and small right posterolateral branch
without evidence of plaque or stenosis.

Right Atrium: Right atrial size is within normal limits.

Right Ventricle: The right ventricular cavity is within normal
limits.

Left Atrium: Left atrial size is normal in size with no left atrial
appendage filling defect.

Left Ventricle: The ventricular cavity size is within normal limits.
There are no stigmata of prior infarction. There is no abnormal
filling defect.

Pulmonary arteries: Normal in size without proximal filling defect.

Pulmonary veins: Normal pulmonary venous drainage.

Pericardium: Normal thickness with no significant effusion or
calcium present.

Cardiac valves: The aortic valve is trileaflet without significant
calcification. The mitral valve is normal structure without
significant calcification.

Aorta: Normal caliber with no significant disease.

Extra-cardiac findings: See attached radiology report for
non-cardiac structures.
IMPRESSION: 1. Coronary calcium score of 0. This was 0 percentile for age-, sex,
and race-matched controls.

2. Normal coronary origin with right dominance.

3. Normal coronary arteries.

RECOMMENDATIONS:
1. CAD-RADS 0: No evidence of CAD (0%). Consider non-atherosclerotic
causes of chest pain.

*** End of Addendum ***
EXAM:
OVER-READ INTERPRETATION  CT CHEST

The following report is an over-read performed by radiologist Dr.
Nazareth Jumper [REDACTED] on 11/12/2020. This over-read
does not include interpretation of cardiac or coronary anatomy or
pathology. The coronary CTA interpretation by the cardiologist is
attached.
FINDINGS: Vascular: Normal aortic caliber. Pulmonary arteries not well
opacified secondary to bolus timing.

Mediastinum/Nodes: No imaged thoracic adenopathy.

Lungs/Pleura: No pleural fluid.  Lingular scarring.

Upper Abdomen: Hepatic steatosis. Normal imaged portions of the
spleen, stomach.

Musculoskeletal: Left hemidiaphragm elevation. No acute osseous
abnormality.
IMPRESSION: No acute findings in the imaged extracardiac chest.

Hepatic steatosis.

## 2023-04-26 ENCOUNTER — Ambulatory Visit: Payer: BC Managed Care – PPO | Admitting: Family Medicine

## 2023-04-26 ENCOUNTER — Encounter: Payer: Self-pay | Admitting: Family Medicine

## 2023-04-26 VITALS — BP 121/77 | HR 80 | Temp 98.8°F | Ht 62.0 in | Wt 235.0 lb

## 2023-04-26 DIAGNOSIS — R5383 Other fatigue: Secondary | ICD-10-CM

## 2023-04-26 DIAGNOSIS — E785 Hyperlipidemia, unspecified: Secondary | ICD-10-CM

## 2023-04-26 DIAGNOSIS — Z1159 Encounter for screening for other viral diseases: Secondary | ICD-10-CM | POA: Diagnosis not present

## 2023-04-26 DIAGNOSIS — Z114 Encounter for screening for human immunodeficiency virus [HIV]: Secondary | ICD-10-CM | POA: Diagnosis not present

## 2023-04-26 DIAGNOSIS — E039 Hypothyroidism, unspecified: Secondary | ICD-10-CM | POA: Diagnosis not present

## 2023-04-26 DIAGNOSIS — E1169 Type 2 diabetes mellitus with other specified complication: Secondary | ICD-10-CM

## 2023-04-26 MED ORDER — BLOOD GLUCOSE MONITORING SUPPL DEVI: 0 refills | Status: DC

## 2023-04-26 MED ORDER — LANCETS MISC. MISC: 3 refills | Status: DC

## 2023-04-26 MED ORDER — LANCET DEVICE MISC
0 refills | Status: AC
Start: 2023-04-26 — End: ?

## 2023-04-26 MED ORDER — BLOOD GLUCOSE TEST VI STRP: ORAL_STRIP | 3 refills | Status: DC
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
## 2023-04-27 LAB — BASIC METABOLIC PANEL
BUN/Creatinine Ratio: 18 (ref 9–23)
BUN: 14 mg/dL (ref 6–24)
CO2: 23 mmol/L (ref 20–29)
Calcium: 8.8 mg/dL (ref 8.7–10.2)
Chloride: 104 mmol/L (ref 96–106)
Creatinine, Ser: 0.77 mg/dL (ref 0.57–1.00)
Glucose: 128 mg/dL — ABNORMAL HIGH (ref 70–99)
Potassium: 3.8 mmol/L (ref 3.5–5.2)
Sodium: 141 mmol/L (ref 134–144)
eGFR: 95 mL/min/{1.73_m2} (ref >59)

## 2023-04-27 LAB — VITAMIN B12: Vitamin B-12: 705 pg/mL (ref 232–1245)

## 2023-04-27 LAB — TSH+FREE T4
Free T4: 0.97 ng/dL (ref 0.82–1.77)
TSH: 0.961 u[IU]/mL (ref 0.450–4.500)

## 2023-04-27 LAB — HIV ANTIBODY (ROUTINE TESTING W REFLEX): HIV Screen 4th Generation wRfx: NONREACTIVE

## 2023-04-27 LAB — HEPATITIS C ANTIBODY: Hep C Virus Ab: NONREACTIVE

## 2023-04-29 LAB — BAYER DCA HB A1C WAIVED: HB A1C (BAYER DCA - WAIVED): 6.4 % — ABNORMAL HIGH (ref 4.8–5.6)

## 2023-04-30 ENCOUNTER — Ambulatory Visit (INDEPENDENT_AMBULATORY_CARE_PROVIDER_SITE_OTHER): Payer: BC Managed Care – PPO | Admitting: *Deleted

## 2023-04-30 DIAGNOSIS — E1169 Type 2 diabetes mellitus with other specified complication: Secondary | ICD-10-CM

## 2023-04-30 NOTE — Progress Notes (Signed)
Christy Gay arrived 04/30/2023 and has given verbal consent to obtain images and complete their overdue diabetic retinal screening.  The images have been sent to an ophthalmologist or optometrist for review and interpretation.  Results will be sent back to [name], DO for review.  Patient has been informed they will be contacted when we receive the results via telephone or MyChart

## 2023-05-06 LAB — HM MAMMOGRAPHY

## 2023-05-24 ENCOUNTER — Encounter: Payer: Self-pay | Admitting: Family Medicine

## 2023-06-07 ENCOUNTER — Encounter: Payer: Self-pay | Admitting: Nurse Practitioner

## 2023-06-07 ENCOUNTER — Telehealth: Payer: 59 | Admitting: Nurse Practitioner

## 2023-06-07 DIAGNOSIS — J101 Influenza due to other identified influenza virus with other respiratory manifestations: Secondary | ICD-10-CM | POA: Insufficient documentation

## 2023-06-07 DIAGNOSIS — R0981 Nasal congestion: Secondary | ICD-10-CM

## 2023-06-07 DIAGNOSIS — J3489 Other specified disorders of nose and nasal sinuses: Secondary | ICD-10-CM | POA: Diagnosis not present

## 2023-06-07 DIAGNOSIS — R6889 Other general symptoms and signs: Secondary | ICD-10-CM | POA: Insufficient documentation

## 2023-06-07 LAB — VERITOR FLU A/B WAIVED
Influenza A: POSITIVE — AB
Influenza B: NEGATIVE

## 2023-06-07 MED ORDER — OSELTAMIVIR PHOSPHATE 75 MG PO CAPS
75.0000 mg | ORAL_CAPSULE | Freq: Two times a day (BID) | ORAL | 0 refills | Status: AC
Start: 2023-06-07 — End: 2023-06-12

## 2023-06-07 MED ORDER — FLUTICASONE PROPIONATE 50 MCG/ACT NA SUSP
2.0000 | Freq: Every day | NASAL | 6 refills | Status: DC
Start: 2023-06-07 — End: 2023-11-08
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
## 2023-07-09 ENCOUNTER — Ambulatory Visit: Payer: Self-pay

## 2023-07-09 VITALS — Ht 62.0 in | Wt 235.0 lb

## 2023-07-09 DIAGNOSIS — Z1211 Encounter for screening for malignant neoplasm of colon: Secondary | ICD-10-CM

## 2023-07-09 MED ORDER — SUTAB 1479-225-188 MG PO TABS: 12.0000 | ORAL_TABLET | ORAL | 0 refills | Status: DC

## 2023-07-09 MED ORDER — SUTAB 1479-225-188 MG PO TABS
12.0000 | ORAL_TABLET | ORAL | 0 refills | Status: DC
Start: 2023-07-09 — End: 2023-07-30

## 2023-07-09 NOTE — Progress Notes (Signed)
 No egg or soy allergy known to patient  No issues known to pt with past sedation with any surgeries or procedures Patient denies ever being told they had issues or difficulty with intubation  No FH of Malignant Hyperthermia Pt is not on diet pills Pt is not on  home 02  Pt is not on blood thinners  Pt denies issues with constipation  No A fib or A flutter Have any cardiac testing pending--no   [facility] with [facility]--ALLERGY TO POLYETHYLENE GLYCOL  Patient's chart reviewed by Cathlyn Parsons CRNA prior to previsit and patient appropriate for the [facility].  Previsit completed and red dot placed by patient's name on their procedure day (on provider's schedule).

## 2023-07-26 ENCOUNTER — Encounter: Payer: Self-pay | Admitting: Certified Registered Nurse Anesthetist

## 2023-07-26 ENCOUNTER — Encounter: Payer: Self-pay | Admitting: Gastroenterology

## 2023-07-30 ENCOUNTER — Encounter: Payer: Self-pay | Admitting: Gastroenterology

## 2023-07-30 ENCOUNTER — Ambulatory Visit (AMBULATORY_SURGERY_CENTER): Payer: Self-pay | Admitting: Gastroenterology

## 2023-07-30 VITALS — BP 121/75 | HR 64 | Temp 98.1°F | Resp 15 | Ht 62.0 in | Wt 235.0 lb

## 2023-07-30 DIAGNOSIS — Z1211 Encounter for screening for malignant neoplasm of colon: Secondary | ICD-10-CM | POA: Diagnosis present

## 2023-07-30 DIAGNOSIS — K648 Other hemorrhoids: Secondary | ICD-10-CM

## 2023-07-30 DIAGNOSIS — D125 Benign neoplasm of sigmoid colon: Secondary | ICD-10-CM | POA: Diagnosis not present

## 2023-07-30 HISTORY — DX: Morbid (severe) obesity due to excess calories: E66.01

## 2023-07-30 MED ORDER — SODIUM CHLORIDE 0.9 % IV SOLN: 500.0000 mL | Freq: Once | INTRAVENOUS | Status: DC

## 2023-07-30 NOTE — Op Note (Signed)
 [facility] Patient Name: Christy Gay Procedure Date: 07/30/2023 10:00 AM MRN: [id number] Endoscopist: Viviann Spare P. Adela Lank , MD, [id number] Age: 48 Referring MD:  Date of Birth: Aug 27, 1975 Gender: Female Account #: [id number] Procedure:                Colonoscopy Indications:              Screening for colorectal malignant neoplasm, This                            is the patient's first colonoscopy Medicines:                Monitored Anesthesia Care Procedure:                Pre-Anesthesia Assessment:                           - Prior to the procedure, a History and Physical                            was performed, and patient medications and                            allergies were reviewed. The patient's tolerance of                            previous anesthesia was also reviewed. The risks                            and benefits of the procedure and the sedation                            options and risks were discussed with the patient.                            All questions were answered, and informed consent                            was obtained. Prior Anticoagulants: The patient has                            taken no anticoagulant or antiplatelet agents. ASA                            Grade Assessment: III - A patient with severe                            systemic disease. After reviewing the risks and                            benefits, the patient was deemed in satisfactory                            condition to undergo the procedure.  After obtaining informed consent, the colonoscope                            was passed under direct vision. Throughout the                            procedure, the patient's blood pressure, pulse, and                            oxygen saturations were monitored continuously. The                            Olympus Scope [id number] was introduced through the                            anus and  advanced to the the terminal ileum, with                            identification of the appendiceal orifice and IC                            valve. The colonoscopy was performed without                            difficulty. The patient tolerated the procedure                            well. The quality of the bowel preparation was                            good. The ileocecal valve, appendiceal orifice, and                            rectum were photographed. Scope In: 10:07:29 AM Scope Out: 10:31:08 AM Scope Withdrawal Time: 0 hours 21 minutes 14 seconds  Total Procedure Duration: 0 hours 23 minutes 39 seconds  Findings:                 The perianal and digital rectal examinations were                            normal.                           The terminal ileum appeared normal.                           A 3 mm polyp was found in the sigmoid colon. The                            polyp was sessile and located behind a fold,                            difficult to visualize. When snare was placed, lost  location of the polyp, had to make multiple passes                            through the sigmoid to locate it again, which                            prolonged the exam. The polyp was removed with a                            cold snare. Resection and retrieval were complete.                           Internal hemorrhoids were found during retroflexion.                           The exam was otherwise without abnormality. Complications:            No immediate complications. Estimated blood loss:                            Minimal. Estimated Blood Loss:     Estimated blood loss was minimal. Impression:               - The examined portion of the ileum was normal.                           - One 3 mm polyp in the sigmoid colon, removed with                            a cold snare. Resected and retrieved.                           - Internal hemorrhoids.                            - The examination was otherwise normal. Recommendation:           - Patient has a contact number available for                            emergencies. The signs and symptoms of potential                            delayed complications were discussed with the                            patient. Return to normal activities tomorrow.                            Written discharge instructions were provided to the                            patient.                           -  Resume previous diet.                           - Continue present medications.                           - Await pathology results. Viviann Spare P. Adela Lank, MD 07/30/2023 10:35:47 AM This report has been signed electronically.

## 2023-07-30 NOTE — Progress Notes (Signed)
 Pt's states no medical or surgical changes since previsit or office visit.

## 2023-07-30 NOTE — Progress Notes (Signed)
 Report given to PACU, vss
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
## 2023-07-30 NOTE — Progress Notes (Signed)
 Called to room to assist during endoscopic procedure.  Patient ID and intended procedure confirmed with present staff. Received instructions for my participation in the procedure from the performing physician.

## 2023-07-30 NOTE — Patient Instructions (Signed)
 -  Resume previous diet. - Continue present medications. - Await pathology results. - Patient given educational handouts related to procedure.   YOU HAD AN ENDOSCOPIC PROCEDURE TODAY AT THE [facility]:   Refer to the procedure report that was given to you for any specific questions about what was found during the examination.  If the procedure report does not answer your questions, please call your gastroenterologist to clarify.  If you requested that your care partner not be given the details of your procedure findings, then the procedure report has been included in a sealed envelope for you to review at your convenience later.  YOU SHOULD EXPECT: Some feelings of bloating in the abdomen. Passage of more gas than usual.  Walking can help get rid of the air that was put into your GI tract during the procedure and reduce the bloating. If you had a lower endoscopy (such as a colonoscopy or flexible sigmoidoscopy) you may notice spotting of blood in your stool or on the toilet paper. If you underwent a bowel prep for your procedure, you may not have a normal bowel movement for a few days.  Please Note:  You might notice some irritation and congestion in your nose or some drainage.  This is from the oxygen used during your procedure.  There is no need for concern and it should clear up in a day or so.  SYMPTOMS TO REPORT IMMEDIATELY:  Following lower endoscopy (colonoscopy or flexible sigmoidoscopy):  Excessive amounts of blood in the stool  Significant tenderness or worsening of abdominal pains  Swelling of the abdomen that is new, acute  Fever of 100F or higher  For urgent or emergent issues, a gastroenterologist can be reached at any hour by calling [phone]. Do not use MyChart messaging for urgent concerns.    DIET:  We do recommend a small meal at first, but then you may proceed to your regular diet.  Drink plenty of fluids but you should avoid alcoholic beverages for 24  hours.  ACTIVITY:  You should plan to take it easy for the rest of today and you should NOT DRIVE or use heavy machinery until tomorrow (because of the sedation medicines used during the test).    FOLLOW UP: Our staff will call the number listed on your records the next business day following your procedure.  We will call around 7:15- 8:00 am to check on you and address any questions or concerns that you may have regarding the information given to you following your procedure. If we do not reach you, we will leave a message.     If any biopsies were taken you will be contacted by phone or by letter within the next 1-3 weeks.  Please call us at [phone] if you have not heard about the biopsies in 3 weeks.    SIGNATURES/CONFIDENTIALITY: You and/or your care partner have signed paperwork which will be entered into your electronic medical record.  These signatures attest to the fact that that the information above on your After Visit Summary has been reviewed and is understood.  Full responsibility of the confidentiality of this discharge information lies with you and/or your care-partner.

## 2023-08-01 ENCOUNTER — Emergency Department (HOSPITAL_BASED_OUTPATIENT_CLINIC_OR_DEPARTMENT_OTHER)

## 2023-08-01 ENCOUNTER — Other Ambulatory Visit: Payer: Self-pay

## 2023-08-01 ENCOUNTER — Emergency Department (HOSPITAL_BASED_OUTPATIENT_CLINIC_OR_DEPARTMENT_OTHER)
Admission: EM | Admit: 2023-08-01 | Discharge: 2023-08-01 | Disposition: A | Discharge status: Home / Self Care | Attending: Emergency Medicine | Admitting: Emergency Medicine

## 2023-08-01 ENCOUNTER — Encounter (HOSPITAL_BASED_OUTPATIENT_CLINIC_OR_DEPARTMENT_OTHER): Payer: Self-pay

## 2023-08-01 DIAGNOSIS — R1084 Generalized abdominal pain: Secondary | ICD-10-CM | POA: Diagnosis present

## 2023-08-01 LAB — CBC WITH DIFFERENTIAL/PLATELET
Abs Immature Granulocytes: 0.01 10*3/uL (ref 0.00–0.07)
Basophils Absolute: 0.1 10*3/uL (ref 0.0–0.1)
Basophils Relative: 1 %
Eosinophils Absolute: 0.2 10*3/uL (ref 0.0–0.5)
Eosinophils Relative: 3 %
HCT: 40.7 % (ref 36.0–46.0)
Hemoglobin: 13.2 g/dL (ref 12.0–15.0)
Immature Granulocytes: 0 %
Lymphocytes Relative: 38 %
Lymphs Abs: 3.2 10*3/uL (ref 0.7–4.0)
MCH: 28.9 pg (ref 26.0–34.0)
MCHC: 32.4 g/dL (ref 30.0–36.0)
MCV: 89.3 fL (ref 80.0–100.0)
Monocytes Absolute: 0.6 10*3/uL (ref 0.1–1.0)
Monocytes Relative: 7 %
Neutro Abs: 4.4 10*3/uL (ref 1.7–7.7)
Neutrophils Relative %: 51 %
Platelets: 288 10*3/uL (ref 150–400)
RBC: 4.56 MIL/uL (ref 3.87–5.11)
RDW: 13.3 % (ref 11.5–15.5)
WBC: 8.4 10*3/uL (ref 4.0–10.5)
nRBC: 0 % (ref 0.0–0.2)

## 2023-08-01 LAB — COMPREHENSIVE METABOLIC PANEL WITH GFR
ALT: 13 U/L (ref 0–44)
AST: 12 U/L — ABNORMAL LOW (ref 15–41)
Albumin: 4.2 g/dL (ref 3.5–5.0)
Alkaline Phosphatase: 64 U/L (ref 38–126)
Anion gap: 8 (ref 5–15)
BUN: 14 mg/dL (ref 6–20)
CO2: 27 mmol/L (ref 22–32)
Calcium: 9.6 mg/dL (ref 8.9–10.3)
Chloride: 106 mmol/L (ref 98–111)
Creatinine, Ser: 0.73 mg/dL (ref 0.44–1.00)
GFR, Estimated: >60 mL/min (ref >60)
Glucose, Bld: 89 mg/dL (ref 70–99)
Potassium: 4.1 mmol/L (ref 3.5–5.1)
Sodium: 141 mmol/L (ref 135–145)
Total Bilirubin: 0.3 mg/dL (ref 0.0–1.2)
Total Protein: 7.1 g/dL (ref 6.5–8.1)

## 2023-08-01 LAB — URINALYSIS, W/ REFLEX TO CULTURE (INFECTION SUSPECTED)
Bilirubin Urine: NEGATIVE
Glucose, UA: NEGATIVE mg/dL
Hgb urine dipstick: NEGATIVE
Ketones, ur: NEGATIVE mg/dL
Leukocytes,Ua: NEGATIVE
Nitrite: NEGATIVE
Protein, ur: NEGATIVE mg/dL
Specific Gravity, Urine: 1.019 (ref 1.005–1.030)
pH: 6 (ref 5.0–8.0)

## 2023-08-01 LAB — LIPASE, BLOOD: Lipase: 14 U/L (ref 11–51)

## 2023-08-01 MED ORDER — IOHEXOL 300 MG/ML  SOLN
100.0000 mL | Freq: Once | INTRAMUSCULAR | Status: AC | PRN
  Administered 2023-08-01: 100 mL via INTRAVENOUS

## 2023-08-01 MED ORDER — DICYCLOMINE HCL 10 MG PO CAPS
10.0000 mg | ORAL_CAPSULE | Freq: Once | ORAL | Status: DC
  Filled 2023-08-01: qty 1

## 2023-08-01 MED ORDER — DICYCLOMINE HCL 10 MG PO CAPS
10.0000 mg | ORAL_CAPSULE | Freq: Once | ORAL | Status: AC
  Administered 2023-08-01: 10 mg via ORAL
  Filled 2023-08-01: qty 1

## 2023-08-01 MED ORDER — DICYCLOMINE HCL 20 MG PO TABS: 20.0000 mg | ORAL_TABLET | Freq: Two times a day (BID) | ORAL | 0 refills | Status: DC

## 2023-08-01 NOTE — ED Triage Notes (Signed)
 Pt c/o R sided abd pain onset "mostly yesterday," colonoscopy Friday, reports "small polyp taken but besides that, everything is fine." Denies urinary complaints, hx kidney stones, endorses nausea "& just not wanting to eat." Last BM Friday afternoon

## 2023-08-01 NOTE — ED Provider Notes (Signed)
  EMERGENCY DEPARTMENT AT [facility] Provider Note   CSN: [id number] Arrival date & time: 08/01/23  1823     History  Chief Complaint  Patient presents with   Abdominal Pain    Christy Gay is a 48 y.o. female.  Patient is a 48 year old female who presents with abdominal pain.  She had a colonoscopy 2 days ago.  She was doing well at that time.  She said they took a small polyp out.  She was not have any pain that day but yesterday started having some pain in her abdomen.  She feels bloated.  She says the pain is could have moving around.  It got worse today.  She has had some nausea but no vomiting.  No fevers.  No urinary symptoms.  She said she had a bowel movement after the colonoscopy on Friday that was loose but has not had one since that time.  No prior abdominal surgeries.       Home Medications Prior to Admission medications   Medication Sig Start Date End Date Taking? Authorizing Provider  dicyclomine (BENTYL) 20 MG tablet Take 1 tablet (20 mg total) by mouth 2 (two) times daily. 08/01/23  Yes Rolan Bucco, MD  Blood Glucose Monitoring Suppl (ACCU-CHEK GUIDE ME) w/Device KIT CHECK BLOOD GLUCOSE DAILY E11.69. Patient not taking: Reported on 07/30/2023 05/03/23   [provider]  Blood Glucose Monitoring Suppl DEVI Check BGs daily E11.69. May substitute to any manufacturer covered by patient's insurance. Patient not taking: Reported on 07/30/2023 04/26/23   Delynn Flavin M, DO  Dapsone 5 % topical gel APPLY PEA SIZED AMOUNT TO THE FACE TWICE DAILY FOR ACNE AS DIRECTED. 10/17/22   Delynn Flavin M, DO  fluticasone (FLONASE) 50 MCG/ACT nasal spray Place 2 sprays into both nostrils daily. 06/07/23   St Vena Austria, NP  Glucose Blood (BLOOD GLUCOSE TEST STRIPS) STRP Check BGs daily E11.69. May substitute to any manufacturer covered by patient's insurance. Patient not taking: Reported on 07/30/2023 04/26/23   [name], DO   Lancet Device MISC Check BGs daily E11.69.May substitute to any manufacturer covered by patient's insurance. Patient not taking: Reported on 07/30/2023 04/26/23   Delynn Flavin M, DO  Lancets [facility] DELICA PLUS [place]) MISC Apply topically daily. Patient not taking: Reported on 07/30/2023 04/26/23   [provider]  Lancets Misc. MISC Check BGs daily E11.69.May substitute to any manufacturer covered by patient's insurance. Patient not taking: Reported on 07/30/2023 04/26/23   [name], DO  levothyroxine (SYNTHROID) 50 MCG tablet TAKE 1 TABLET BY MOUTH EVERY DAY 11/09/22   Delynn Flavin M, DO  meloxicam (MOBIC) 7.5 MG tablet Take 1 tablet PO twice a day for 2 weeks and then PRN 04/30/23   [provider]  tiZANidine (ZANAFLEX) 4 MG tablet Take 4 mg by mouth 2 (two) times daily as needed. 06/08/22   [provider]  venlafaxine XR (EFFEXOR-XR) 150 MG 24 hr capsule Take 75 mg by mouth daily with breakfast.    [provider]  venlafaxine XR (EFFEXOR-XR) 75 MG 24 hr capsule Take 75 mg by mouth daily.    [provider]  zonisamide (ZONEGRAN) 100 MG capsule Take 2 capsules by mouth daily.    [provider]  zonisamide (ZONEGRAN) 25 MG capsule Take 25 mg by mouth daily.    [provider]      Allergies    Covid-19 (subunit) vaccine, Polyethylene glycol, Ozempic (0.25 or 0.5  mg-dose) [semaglutide(0.25 or 0.5mg -dos)], Jardiance [empagliflozin], and Keflex [cephalexin]    Review of Systems   Review of Systems  Constitutional:  Negative for chills, diaphoresis, fatigue and fever.  HENT:  Negative for congestion, rhinorrhea and sneezing.   Eyes: Negative.   Respiratory:  Negative for cough, chest tightness and shortness of breath.   Cardiovascular:  Negative for chest pain and leg swelling.  Gastrointestinal:  Positive for abdominal pain and nausea. Negative for blood in stool, diarrhea and vomiting.  Genitourinary:   Negative for difficulty urinating, flank pain, frequency and hematuria.  Musculoskeletal:  Negative for arthralgias and back pain.  Skin:  Negative for rash.  Neurological:  Negative for dizziness, speech difficulty, weakness, numbness and headaches.    Physical Exam Updated Vital Signs BP 127/84   Pulse 64   Temp 97.8 F (36.6 C)   Resp 18   LMP 01/07/2020   SpO2 96%  Physical Exam Constitutional:      Appearance: She is well-developed.  HENT:     Head: Normocephalic and atraumatic.  Eyes:     Pupils: Pupils are equal, round, and reactive to light.  Cardiovascular:     Rate and Rhythm: Normal rate and regular rhythm.     Heart sounds: Normal heart sounds.  Pulmonary:     Effort: Pulmonary effort is normal. No respiratory distress.     Breath sounds: Normal breath sounds. No wheezing or rales.  Chest:     Chest wall: No tenderness.  Abdominal:     General: Bowel sounds are normal.     Palpations: Abdomen is soft.     Tenderness: There is abdominal tenderness in the right upper quadrant and left upper quadrant. There is no guarding or rebound.  Musculoskeletal:        General: Normal range of motion.     Cervical back: Normal range of motion and neck supple.  Lymphadenopathy:     Cervical: No cervical adenopathy.  Skin:    General: Skin is warm and dry.     Findings: No rash.  Neurological:     Mental Status: She is alert and oriented to person, place, and time.     ED Results / Procedures / Treatments   Labs (all labs ordered are listed, but only abnormal results are displayed) Labs Reviewed  COMPREHENSIVE METABOLIC PANEL WITH GFR - Abnormal; Notable for the following components:      Result Value   AST 12 (*)    All other components within normal limits  URINALYSIS, W/ REFLEX TO CULTURE (INFECTION SUSPECTED) - Abnormal; Notable for the following components:   Bacteria, UA RARE (*)    All other components within normal limits  LIPASE, BLOOD  CBC WITH  DIFFERENTIAL/PLATELET    EKG None  Radiology CT ABDOMEN PELVIS W CONTRAST Result Date: 08/01/2023 CLINICAL DATA:  Acute nonlocalized abdominal pain. Colonoscopy last Friday. EXAM: CT ABDOMEN AND PELVIS WITH CONTRAST TECHNIQUE: Multidetector CT imaging of the abdomen and pelvis was performed using the standard protocol following bolus administration of intravenous contrast. RADIATION DOSE REDUCTION: This exam was performed according to the departmental dose-optimization program which includes automated exposure control, adjustment of the mA and/or kV according to patient size and/or use of iterative reconstruction technique. CONTRAST:  OMNIPAQUE IOHEXOL 300 MG/ML  SOLN COMPARISON:  CT 12/17/2011 FINDINGS: Lower chest: No acute abnormality. Hepatobiliary: Hepatic steatosis. Normal gallbladder. No biliary dilation. Pancreas: Unremarkable. Spleen: Unremarkable. Adrenals/Urinary Tract: Normal adrenal glands. No urinary calculi or hydronephrosis. Bladder is unremarkable.  Stomach/Bowel: Normal caliber large and small bowel. No bowel wall thickening. The appendix is normal.Stomach is within normal limits. Vascular/Lymphatic: No significant vascular findings are present. No enlarged abdominal or pelvic lymph nodes. Reproductive: Unremarkable. Other: No free intraperitoneal fluid or air. Musculoskeletal: No acute fracture. IMPRESSION: 1. No acute abnormality in the abdomen or pelvis. 2. Hepatic steatosis. Electronically Signed   By: Minerva Fester M.D.   On: 08/01/2023 21:03    Procedures Procedures    Medications Ordered in ED Medications  dicyclomine (BENTYL) capsule 10 mg (has no administration in time range)  iohexol (OMNIPAQUE) 300 MG/ML solution 100 mL (100 mLs Intravenous Contrast Given 08/01/23 2046)  dicyclomine (BENTYL) capsule 10 mg (10 mg Oral Given 08/01/23 2132)    ED Course/ Medical Decision Making/ A&P                                 Medical Decision Making Amount and/or  Complexity of Data Reviewed Labs: ordered. Radiology: ordered.  Risk Prescription drug management.   Patient is a 48 year old female who presents with abdominal pain that started a day after she had a colonoscopy.  She has some tenderness across the upper abdomen.  No vomiting.  No fevers.  No peritoneal symptoms.  Her labs reviewed and are nonconcerning.  No evidence of pancreatitis.  Liver enzymes are normal.  CT scan does not show any evidence of perforation, colitis, obstruction or other acute abnormality.  She is overall well-appearing with a benign abdomen.  Will start her on some Bentyl.  Encouraged her to call her gastroenterologist if her symptoms are not improving in the next day or 2.  Return precautions were given.  Final Clinical Impression(s) / ED Diagnoses Final diagnoses:  Generalized abdominal pain    Rx / DC Orders ED Discharge Orders          Ordered    dicyclomine (BENTYL) 20 MG tablet  2 times daily        08/01/23 2125              Rolan Bucco, MD 08/01/23 2140

## 2023-08-02 ENCOUNTER — Telehealth: Payer: Self-pay | Admitting: *Deleted

## 2023-08-02 NOTE — Telephone Encounter (Signed)
 Pt contacted by phone regarding ED visit after [facility] procedure on Friday.  Pt stated that she had pain on Sat and Sun - pt stated pain rating 6/7 out of 10.  ED MD prescribed Bentyl and CT scan performed.  Pt able to return to work today and rating pain 2/10.  Per Dr. Adela Lank, pain may be related to bowel spasm.  Advised pt to contact [facility] office if pain does not subside in day or two.  Education provided to pt - pt verbalized understanding.

## 2023-08-02 NOTE — Telephone Encounter (Signed)
Telephone note created in error. 

## 2023-08-02 NOTE — Telephone Encounter (Signed)
 Attempted post procedure follow up call.  No answer - LVM.

## 2023-08-03 LAB — SURGICAL PATHOLOGY

## 2023-08-09 ENCOUNTER — Encounter: Payer: Self-pay | Admitting: Gastroenterology

## 2023-08-19 ENCOUNTER — Other Ambulatory Visit: Payer: Self-pay | Admitting: Family Medicine

## 2023-08-19 DIAGNOSIS — E039 Hypothyroidism, unspecified: Secondary | ICD-10-CM

## 2023-08-27 ENCOUNTER — Telehealth: Payer: Self-pay

## 2023-08-27 NOTE — Telephone Encounter (Signed)
 Attempted to call pt to get updated information regarding her last pap - she is due for one   If pt has not had one in the last 5 years - please get her to set one up with her obgyn office if she has one or set a apt up with us 

## 2023-10-28 ENCOUNTER — Other Ambulatory Visit: Payer: Self-pay

## 2023-10-28 DIAGNOSIS — E1169 Type 2 diabetes mellitus with other specified complication: Secondary | ICD-10-CM

## 2023-11-03 ENCOUNTER — Encounter: Payer: Self-pay | Admitting: Family Medicine

## 2023-11-03 ENCOUNTER — Ambulatory Visit: Admitting: Family Medicine

## 2023-11-03 VITALS — BP 110/77 | HR 83 | Temp 97.6°F | Ht 62.0 in | Wt 249.4 lb

## 2023-11-03 DIAGNOSIS — H53149 Visual discomfort, unspecified: Secondary | ICD-10-CM | POA: Diagnosis not present

## 2023-11-03 DIAGNOSIS — R519 Headache, unspecified: Secondary | ICD-10-CM | POA: Diagnosis not present

## 2023-11-03 DIAGNOSIS — B0229 Other postherpetic nervous system involvement: Secondary | ICD-10-CM

## 2023-11-03 DIAGNOSIS — F40298 Other specified phobia: Secondary | ICD-10-CM

## 2023-11-03 DIAGNOSIS — G500 Trigeminal neuralgia: Secondary | ICD-10-CM | POA: Diagnosis not present

## 2023-11-03 MED ORDER — KETOROLAC TROMETHAMINE 60 MG/2ML IM SOLN
60.0000 mg | Freq: Once | INTRAMUSCULAR | Status: AC
  Administered 2023-11-03: 60 mg via INTRAMUSCULAR

## 2023-11-03 MED ORDER — PREDNISONE 20 MG PO TABS: 40.0000 mg | ORAL_TABLET | Freq: Every day | ORAL | 0 refills | Status: DC

## 2023-11-03 MED ORDER — ONDANSETRON HCL 4 MG PO TABS: 4.0000 mg | ORAL_TABLET | Freq: Three times a day (TID) | ORAL | 0 refills | Status: DC | PRN
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
## 2023-11-08 ENCOUNTER — Encounter (INDEPENDENT_AMBULATORY_CARE_PROVIDER_SITE_OTHER): Payer: Self-pay | Admitting: Family Medicine

## 2023-11-08 ENCOUNTER — Encounter: Payer: Self-pay | Admitting: Family Medicine

## 2023-11-08 DIAGNOSIS — G500 Trigeminal neuralgia: Secondary | ICD-10-CM | POA: Diagnosis not present

## 2023-11-08 DIAGNOSIS — H53149 Visual discomfort, unspecified: Secondary | ICD-10-CM

## 2023-11-08 DIAGNOSIS — F40298 Other specified phobia: Secondary | ICD-10-CM

## 2023-11-08 DIAGNOSIS — R519 Headache, unspecified: Secondary | ICD-10-CM

## 2023-11-08 DIAGNOSIS — B0229 Other postherpetic nervous system involvement: Secondary | ICD-10-CM

## 2023-11-08 NOTE — Telephone Encounter (Signed)
 Duplicate. Sent to pcp. LS

## 2023-11-09 ENCOUNTER — Encounter: Payer: Self-pay | Admitting: Neurology

## 2023-11-10 ENCOUNTER — Other Ambulatory Visit: Payer: Self-pay | Admitting: Family Medicine

## 2023-11-10 DIAGNOSIS — R519 Headache, unspecified: Secondary | ICD-10-CM

## 2023-11-10 DIAGNOSIS — G479 Sleep disorder, unspecified: Secondary | ICD-10-CM

## 2023-11-10 NOTE — Telephone Encounter (Signed)
 Please see the MyChart message reply(ies) for my assessment and plan.    This patient gave consent for this Medical Advice Message and is aware that it may result in a bill to [employer], as well as the possibility of receiving a bill for a co-payment or deductible. They are an established patient, but are not seeking medical advice exclusively about a problem treated during an in person or video visit in the last seven days. I did not recommend an in person or video visit within seven days of my reply.    I spent a total of 10 minutes cumulative time within 7 days through [employer].  Delynn Flavin, DO

## 2023-11-10 NOTE — Addendum Note (Signed)
 Addended by: JOLINDA NORENE HERO on: 11/10/2023 04:45 PM   Modules accepted: Orders

## 2023-11-19 ENCOUNTER — Ambulatory Visit: Payer: BC Managed Care – PPO | Admitting: Family Medicine

## 2023-11-19 ENCOUNTER — Encounter: Payer: Self-pay | Admitting: Family Medicine

## 2023-11-19 VITALS — BP 124/77 | HR 79 | Temp 98.5°F | Ht 62.0 in | Wt 248.0 lb

## 2023-11-19 DIAGNOSIS — Z1321 Encounter for screening for nutritional disorder: Secondary | ICD-10-CM

## 2023-11-19 DIAGNOSIS — N912 Amenorrhea, unspecified: Secondary | ICD-10-CM | POA: Diagnosis not present

## 2023-11-19 DIAGNOSIS — G43001 Migraine without aura, not intractable, with status migrainosus: Secondary | ICD-10-CM | POA: Diagnosis not present

## 2023-11-19 DIAGNOSIS — Z0000 Encounter for general adult medical examination without abnormal findings: Secondary | ICD-10-CM | POA: Diagnosis not present

## 2023-11-19 DIAGNOSIS — E785 Hyperlipidemia, unspecified: Secondary | ICD-10-CM

## 2023-11-19 DIAGNOSIS — F418 Other specified anxiety disorders: Secondary | ICD-10-CM

## 2023-11-19 DIAGNOSIS — R232 Flushing: Secondary | ICD-10-CM | POA: Diagnosis not present

## 2023-11-19 DIAGNOSIS — Z789 Other specified health status: Secondary | ICD-10-CM

## 2023-11-19 DIAGNOSIS — Z1211 Encounter for screening for malignant neoplasm of colon: Secondary | ICD-10-CM

## 2023-11-19 DIAGNOSIS — Z23 Encounter for immunization: Secondary | ICD-10-CM | POA: Diagnosis not present

## 2023-11-19 DIAGNOSIS — E1169 Type 2 diabetes mellitus with other specified complication: Secondary | ICD-10-CM

## 2023-11-19 DIAGNOSIS — E039 Hypothyroidism, unspecified: Secondary | ICD-10-CM

## 2023-11-19 LAB — BAYER DCA HB A1C WAIVED: HB A1C (BAYER DCA - WAIVED): 6.6 % — ABNORMAL HIGH (ref 4.8–5.6)

## 2023-11-19 MED ORDER — VENLAFAXINE HCL ER 37.5 MG PO CP24: ORAL_CAPSULE | ORAL | 0 refills | Status: DC

## 2023-11-19 MED ORDER — NURTEC 75 MG PO TBDP: ORAL_TABLET | ORAL | Status: DC
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
## 2023-11-20 ENCOUNTER — Other Ambulatory Visit: Payer: Self-pay | Admitting: Family Medicine

## 2023-11-20 DIAGNOSIS — B0229 Other postherpetic nervous system involvement: Secondary | ICD-10-CM

## 2023-11-20 DIAGNOSIS — H53149 Visual discomfort, unspecified: Secondary | ICD-10-CM

## 2023-11-20 DIAGNOSIS — F40298 Other specified phobia: Secondary | ICD-10-CM

## 2023-11-20 DIAGNOSIS — G500 Trigeminal neuralgia: Secondary | ICD-10-CM

## 2023-11-20 DIAGNOSIS — R519 Headache, unspecified: Secondary | ICD-10-CM

## 2023-11-20 LAB — CMP14+EGFR
ALT: 22 IU/L (ref 0–32)
AST: 15 IU/L (ref 0–40)
Albumin: 4.3 g/dL (ref 3.9–4.9)
Alkaline Phosphatase: 76 IU/L (ref 44–121)
BUN/Creatinine Ratio: 13 (ref 9–23)
BUN: 9 mg/dL (ref 6–24)
Bilirubin Total: 0.3 mg/dL (ref 0.0–1.2)
CO2: 22 mmol/L (ref 20–29)
Calcium: 9.7 mg/dL (ref 8.7–10.2)
Chloride: 104 mmol/L (ref 96–106)
Creatinine, Ser: 0.69 mg/dL (ref 0.57–1.00)
Globulin, Total: 2.5 g/dL (ref 1.5–4.5)
Glucose: 136 mg/dL — ABNORMAL HIGH (ref 70–99)
Potassium: 4.6 mmol/L (ref 3.5–5.2)
Sodium: 145 mmol/L — ABNORMAL HIGH (ref 134–144)
Total Protein: 6.8 g/dL (ref 6.0–8.5)
eGFR: 106 mL/min/1.73 (ref >59)

## 2023-11-20 LAB — LIPID PANEL
Chol/HDL Ratio: 5 ratio — ABNORMAL HIGH (ref 0.0–4.4)
Cholesterol, Total: 281 mg/dL — ABNORMAL HIGH (ref 100–199)
HDL: 56 mg/dL (ref >39)
LDL Chol Calc (NIH): 199 mg/dL — ABNORMAL HIGH (ref 0–99)
Triglycerides: 142 mg/dL (ref 0–149)
VLDL Cholesterol Cal: 26 mg/dL (ref 5–40)

## 2023-11-20 LAB — FSH/LH
FSH: 36.8 m[IU]/mL
LH: 23.5 m[IU]/mL

## 2023-11-20 LAB — HEPATITIS B SURFACE ANTIBODY, QUANTITATIVE: Hepatitis B Surf Ab Quant: 247 m[IU]/mL

## 2023-11-20 LAB — MICROALBUMIN / CREATININE URINE RATIO
Creatinine, Urine: 161.1 mg/dL
Microalb/Creat Ratio: 3 mg/g{creat} (ref 0–29)
Microalbumin, Urine: 4.5 ug/mL

## 2023-11-20 LAB — TSH+FREE T4
Free T4: 1.11 ng/dL (ref 0.82–1.77)
TSH: 1.19 u[IU]/mL (ref 0.450–4.500)

## 2023-11-22 ENCOUNTER — Ambulatory Visit: Payer: Self-pay | Admitting: Family Medicine

## 2023-11-22 DIAGNOSIS — N951 Menopausal and female climacteric states: Secondary | ICD-10-CM

## 2023-11-22 DIAGNOSIS — E1169 Type 2 diabetes mellitus with other specified complication: Secondary | ICD-10-CM

## 2023-11-23 ENCOUNTER — Other Ambulatory Visit: Payer: Self-pay | Admitting: Family Medicine

## 2023-11-23 DIAGNOSIS — E039 Hypothyroidism, unspecified: Secondary | ICD-10-CM

## 2023-11-26 ENCOUNTER — Other Ambulatory Visit: Payer: Self-pay | Admitting: Family Medicine

## 2023-11-26 DIAGNOSIS — N951 Menopausal and female climacteric states: Secondary | ICD-10-CM

## 2023-11-26 MED ORDER — ROSUVASTATIN CALCIUM 10 MG PO TABS: 10.0000 mg | ORAL_TABLET | Freq: Every day | ORAL | 3 refills | Status: DC

## 2023-11-26 MED ORDER — VEOZAH 45 MG PO TABS: 1.0000 | ORAL_TABLET | Freq: Every day | ORAL | 3 refills | Status: DC

## 2023-11-26 NOTE — Telephone Encounter (Signed)
 Name from pharmacy: VEOZAH 45 MG TABLET     Pharmacy comment: Alternative Requested:NON FORMULARY DRUG; PLEASE CONTACT INSURANCE OR CONSIDER ALTERNATIVE.

## 2023-11-26 NOTE — Telephone Encounter (Signed)
 Please inform patient that this medication is not on her formulary so I am going to send it over to the mail order pharmacy through the company to see if we can get this approved.  Please let her know that she should contact [phone] to set up an account with them.

## 2023-11-29 ENCOUNTER — Ambulatory Visit (HOSPITAL_COMMUNITY)
Admission: RE | Admit: 2023-11-29 | Discharge: 2023-11-29 | Disposition: A | Discharge status: Home / Self Care | Source: Ambulatory Visit | Attending: Surgery | Admitting: Surgery

## 2023-11-29 ENCOUNTER — Other Ambulatory Visit (HOSPITAL_COMMUNITY): Payer: Self-pay | Admitting: Medical

## 2023-11-29 DIAGNOSIS — M79605 Pain in left leg: Secondary | ICD-10-CM | POA: Insufficient documentation

## 2023-11-30 ENCOUNTER — Telehealth: Payer: Self-pay | Admitting: Family Medicine

## 2023-11-30 ENCOUNTER — Ambulatory Visit

## 2023-11-30 NOTE — Telephone Encounter (Signed)
 I called and left detailed message for patient making her aware that there are some samples of VEOZAH  in the cabinet for her, that is ready to be picked up, if she can pick up asap.
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
## 2023-12-02 ENCOUNTER — Telehealth: Payer: Self-pay

## 2023-12-02 ENCOUNTER — Ambulatory Visit: Admitting: Neurology

## 2023-12-02 ENCOUNTER — Encounter: Payer: Self-pay | Admitting: Neurology

## 2023-12-02 VITALS — BP 124/77 | HR 79 | Ht 62.0 in | Wt 255.0 lb

## 2023-12-02 DIAGNOSIS — G43709 Chronic migraine without aura, not intractable, without status migrainosus: Secondary | ICD-10-CM | POA: Diagnosis not present

## 2023-12-02 DIAGNOSIS — R110 Nausea: Secondary | ICD-10-CM

## 2023-12-02 DIAGNOSIS — F40298 Other specified phobia: Secondary | ICD-10-CM | POA: Diagnosis not present

## 2023-12-02 DIAGNOSIS — H53149 Visual discomfort, unspecified: Secondary | ICD-10-CM | POA: Diagnosis not present

## 2023-12-02 MED ORDER — NURTEC 75 MG PO TBDP: 75.0000 mg | ORAL_TABLET | ORAL | 5 refills | Status: AC | PRN

## 2023-12-02 MED ORDER — TOPIRAMATE 25 MG PO TABS: 25.0000 mg | ORAL_TABLET | Freq: Every day | ORAL | 5 refills | Status: DC

## 2023-12-02 NOTE — Patient Instructions (Addendum)
-  For migraines: Migraine prevention:  Start Topamax  25 mg every day at bedtime Migraine rescue:  Start Nurtec 75 mg as needed at headache onset, will start approval process. You have samples from PCP. Limit use of pain relievers to no more than 2 days out of week to prevent risk of rebound or medication-overuse headache. Keep headache diary Agree with sleep study and treating OSA if present  -Return to clinic in 3 months  The physicians and staff at [facility] are committed to providing excellent care. You may receive a survey requesting feedback about your experience at our office. We strive to receive very good responses to the survey questions. If you feel that your experience would prevent you from giving the office a very good  response, please contact our office to try to remedy the situation. We may be reached at [phone]. Thank you for taking the time out of your busy day to complete the survey.  Venetia Potters, MD [facility]  More migraine information: Be aware of common food triggers:  - Caffeine:  coffee, black tea, cola, Mt. Dew  - Chocolate  - Dairy:  aged cheeses (brie, blue, cheddar, gouda, Parmasan, provolone, romano, Swiss, etc), chocolate milk, buttermilk, sour cream, limit eggs and yogurt  - Nuts, peanut butter  - Alcohol  - Cereals/grains:  FRESH breads (fresh bagels, sourdough, doughnuts), yeast productions  - Processed/canned/aged/cured meats (pre-packaged deli meats, hotdogs)  - MSG/glutamate:  soy sauce, flavor enhancer, pickled/preserved/marinated foods  - Sweeteners:  aspartame (Equal, Nutrasweet).  Sugar and Splenda are okay  - Vegetables:  legumes (lima beans, lentils, snow peas, fava beans, pinto peans, peas, garbanzo beans), sauerkraut, onions, olives, pickles  - Fruit:  avocados, bananas, citrus fruit (orange, lemon, grapefruit), mango  - Other:  Frozen meals, macaroni and cheese Routine exercise Stay adequately hydrated (aim for 64 oz  water daily) Keep headache diary Maintain proper stress management Maintain proper sleep hygiene Do not skip meals Consider supplements:  magnesium citrate 400mg  daily, riboflavin 400mg  daily, coenzyme Q10 100mg  three times daily.

## 2023-12-03 ENCOUNTER — Telehealth: Payer: Self-pay

## 2023-12-03 ENCOUNTER — Other Ambulatory Visit (HOSPITAL_COMMUNITY): Payer: Self-pay

## 2023-12-03 ENCOUNTER — Other Ambulatory Visit: Payer: Self-pay | Admitting: Neurology

## 2023-12-03 ENCOUNTER — Telehealth: Payer: Self-pay | Admitting: Pharmacy Technician

## 2023-12-03 ENCOUNTER — Encounter: Payer: Self-pay | Admitting: Neurology

## 2023-12-03 DIAGNOSIS — G43709 Chronic migraine without aura, not intractable, without status migrainosus: Secondary | ICD-10-CM

## 2023-12-03 MED ORDER — TOPIRAMATE 25 MG PO CPSP
25.0000 mg | ORAL_CAPSULE | Freq: Every day | ORAL | 5 refills | Status: AC
Start: 2023-12-03 — End: ?

## 2023-12-03 NOTE — Telephone Encounter (Signed)
 Pharmacy Patient Advocate Encounter  Received notification from [facility] that Prior Authorization for NURTEC 75MG  has been CANCELLED due to    Ran test claim for NURTEC 75MG . Currently a quantity of 16 is a 30 day supply and the co-pay is $0 . The current 30 day co-pay is, $0.  No PA needed at this time.  This test claim was processed through [facility]- copay amounts may vary at other pharmacies due to pharmacy/plan contracts, or as the patient moves through the different stages of their insurance plan.

## 2023-12-03 NOTE — Telephone Encounter (Signed)
 Pharmacy Patient Advocate Encounter   Received notification from Pt Calls Messages that prior authorization for NURTEC 75MG  is required/requested.   Insurance verification completed.   The patient is insured through [facility] .   Per test claim: PA required; PA submitted to above mentioned insurance via CoverMyMeds Key/confirmation #/EOC [id number] Status is pending

## 2023-12-03 NOTE — Telephone Encounter (Signed)
 PA has been submitted, and telephone encounter has been created. Please see telephone encounter dated 8.1.25.

## 2023-12-06 NOTE — Telephone Encounter (Signed)
 Called pt and left message on voice mail that Nurtec has been approved.

## 2023-12-07 ENCOUNTER — Other Ambulatory Visit: Payer: Self-pay | Admitting: Medical Genetics

## 2023-12-08 ENCOUNTER — Other Ambulatory Visit (HOSPITAL_COMMUNITY)

## 2023-12-09 ENCOUNTER — Other Ambulatory Visit (HOSPITAL_COMMUNITY)
Admission: RE | Admit: 2023-12-09 | Discharge: 2023-12-09 | Disposition: A | Discharge status: Home / Self Care | Payer: Self-pay | Source: Ambulatory Visit | Attending: Medical Genetics | Admitting: Medical Genetics

## 2023-12-13 ENCOUNTER — Encounter: Payer: Self-pay | Admitting: Family Medicine

## 2023-12-20 NOTE — Telephone Encounter (Signed)
 SABRA

## 2023-12-21 LAB — GENECONNECT MOLECULAR SCREEN: Genetic Analysis Overall Interpretation: NEGATIVE

## 2023-12-30 ENCOUNTER — Ambulatory Visit: Payer: Self-pay

## 2023-12-30 ENCOUNTER — Encounter: Payer: Self-pay | Admitting: Family Medicine

## 2023-12-30 ENCOUNTER — Telehealth: Admitting: Family Medicine

## 2023-12-30 ENCOUNTER — Other Ambulatory Visit: Payer: Self-pay | Admitting: Family Medicine

## 2023-12-30 DIAGNOSIS — U071 COVID-19: Secondary | ICD-10-CM

## 2023-12-30 DIAGNOSIS — E1169 Type 2 diabetes mellitus with other specified complication: Secondary | ICD-10-CM

## 2023-12-30 DIAGNOSIS — G43001 Migraine without aura, not intractable, with status migrainosus: Secondary | ICD-10-CM

## 2023-12-30 MED ORDER — MOLNUPIRAVIR EUA 200MG CAPSULE: 4.0000 | ORAL_CAPSULE | Freq: Two times a day (BID) | ORAL | 0 refills | Status: AC

## 2023-12-30 MED ORDER — NIRMATRELVIR/RITONAVIR (PAXLOVID)TABLET: 3.0000 | ORAL_TABLET | Freq: Two times a day (BID) | ORAL | 0 refills | Status: DC

## 2023-12-30 NOTE — Progress Notes (Signed)
 Virtual Visit via video Note   Due to COVID-19 pandemic this visit was conducted virtually. This visit type was conducted due to national recommendations for restrictions regarding the COVID-19 Pandemic (e.g. social distancing, sheltering in place) in an effort to limit this patient's exposure and mitigate transmission in our community. All issues noted in this document were discussed and addressed.  A physical exam was not performed with this format.  I connected with  Christy Gay  on 12/30/23 at 1316 by video and verified that I am speaking with the correct person using two identifiers. Christy Gay is currently located at home and no one is currently with her during the visit. The provider, Annabella CHRISTELLA Search, FNP is located in their office at time of visit.  I discussed the limitations, risks, security and privacy concerns of performing an evaluation and management service by video  and the availability of in person appointments. I also discussed with the patient that there may be a patient responsible charge related to this service. The patient expressed understanding and agreed to proceed.  CC: Covid  History and Present Illness:  History of Present Illness   Christy Gay is a 48 year old female with type 2 diabetes who presents with symptoms following a positive COVID-19 test.  Symptoms started yesterday.   Upper respiratory symptoms - Fever present - Nasal congestion present - Sore throat present - No productive cough - No wheezing  Gastrointestinal and systemic symptoms - No nausea or vomiting  Cardiopulmonary symptoms - No chest pain  Symptom management - Taking DayQuil with some alleviation of symptoms        ROS As per HPI.     Observations/Objective: Alert and oriented. Respirations unlabored. No cyanosis. Non toxic appearing. Normal mood and behavior.   Assessment and Plan: Christy Gay was seen today for covid positive.  Diagnoses and all orders for  this visit:  COVID-19 -     nirmatrelvir /ritonavir  (PAXLOVID ) 20 x 150 MG & 10 x 100MG  TABS; Take 3 tablets by mouth 2 (two) times daily for 5 days. (Take nirmatrelvir  150 mg two tablets twice daily for 5 days and ritonavir  100 mg one tablet twice daily for 5 days) Patient GFR is 106  Type 2 diabetes mellitus with other specified complication, without long-term current use of insulin (HCC) -     nirmatrelvir /ritonavir  (PAXLOVID ) 20 x 150 MG & 10 x 100MG  TABS; Take 3 tablets by mouth 2 (two) times daily for 5 days. (Take nirmatrelvir  150 mg two tablets twice daily for 5 days and ritonavir  100 mg one tablet twice daily for 5 days) Patient GFR is 106  Morbid obesity (HCC) -     nirmatrelvir /ritonavir  (PAXLOVID ) 20 x 150 MG & 10 x 100MG  TABS; Take 3 tablets by mouth 2 (two) times daily for 5 days. (Take nirmatrelvir  150 mg two tablets twice daily for 5 days and ritonavir  100 mg one tablet twice daily for 5 days) Patient GFR is 106  Migraine without aura and with status migrainosus, not intractable      COVID-19 infection Acute COVID-19 confirmed. Discussed Paxlovid  to reduce hospitalization risk due to DM and obesity comorbidities.  - Prescribe Paxlovid , twice daily for five days, if insured. - Pause Nurtec during Paxlovid . - Continue OTC decongestants and acetaminophen  for fever. - Advise home isolation until fever-free for 24 hours without medication. - Upload work note to [employer] for absence until post-Labor Day Tuesday. - Send prescription to preferred pharmacy.  Type  2 diabetes mellitus No changes in management this visit.      Follow Up Instructions: Return to office for new or worsening symptoms, or if symptoms persist.     I discussed the assessment and treatment plan with the patient. The patient was provided an opportunity to ask questions and all were answered. The patient agreed with the plan and demonstrated an understanding of the instructions.   The patient was advised to  call back or seek an in-person evaluation if the symptoms worsen or if the condition fails to improve as anticipated.  The above assessment and management plan was discussed with the patient. The patient verbalized understanding of and has agreed to the management plan. Patient is aware to call the clinic if symptoms persist or worsen. Patient is aware when to return to the clinic for a follow-up visit. Patient educated on when it is appropriate to go to the emergency department.   Time call ended: 1321  I provided 5 minutes of face-to-face time during this encounter.    Annabella CHRISTELLA Search, FNP

## 2023-12-30 NOTE — Telephone Encounter (Signed)
 FYI Only or Action Required?: FYI only for provider.  Patient was last seen in primary care on 11/19/2023 by Jolinda Norene HERO, DO.  Called Nurse Triage reporting Covid Positive.  Symptoms began yesterday.  Interventions attempted: OTC medications: Dayquil with tylenol , nasal decongestant.  Symptoms are: gradually worsening.  Triage Disposition: See HCP Within 4 Hours (Or PCP Triage)  Patient/caregiver understands and will follow disposition?: Yes    Copied from CRM [id number]. Topic: Clinical - Red Word Triage >> Dec 30, 2023 10:58 AM Carlatta H wrote: [facility] that prompted transfer to Nurse Triage: Patient is home and took a home covid test that was positive//Congestion and severe headache// Reason for Disposition  MILD difficulty breathing (e.g., minimal/no SOB at rest, SOB with walking, pulse <100)  Answer Assessment - Initial Assessment Questions Symptoms started last night with throat pain. Symptoms got worse overnight with chills, body aches, cough, and congestion. Taking Dayquil with tylenol , and nasal decongestant for symptoms. Patient requesting medication for symptoms and scheduled for a virtual visit with office.     1. COVID-19 DIAGNOSIS: How do you know that you have COVID? (e.g., positive lab test or self-test, diagnosed by doctor or NP/PA, symptoms after exposure).     Homes test today at home positive  2. COVID-19 EXPOSURE: Was there any known exposure to COVID before the symptoms began? CDC Definition of close contact: within 6 feet (2 meters) for a total of 15 minutes or more over a 24-hour period.      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 ONSET: When did the COVID-19 symptoms start?      Last night  4. WORST SYMPTOM: What is your worst symptom? (e.g., cough, fever, shortness of breath, muscle aches     Headaches and body aches.  5. COUGH: Do you have a cough? If Yes, ask: How bad is the cough?       Yes,  cough is mild.  6. FEVER: Do you have a fever? If Yes, ask: What is your temperature, how was it measured, and when did it start?     Temperature right at 100  7. RESPIRATORY STATUS: Describe your breathing? (e.g., normal; shortness of breath, wheezing, unable to speak)      A little short of breath when moving around.  8. BETTER-SAME-WORSE: Are you getting better, staying the same or getting worse compared to yesterday?  If getting worse, ask, In what way?     Symptoms are worsening.  9. OTHER SYMPTOMS: Do you have any other symptoms?  (e.g., chills, fatigue, headache, loss of smell or taste, muscle pain, sore throat)     Stomach feels upset 10. HIGH RISK DISEASE: Do you have any chronic medical problems? (e.g., asthma, heart or lung disease, weak immune system, obesity, etc.)       Denies states she has an allergy to PEG  11. O2 SATURATION MONITOR:  Do you use an oxygen saturation monitor (pulse oximeter) at home? If Yes, ask What is your reading (oxygen level) today? What is your usual oxygen saturation reading? (e.g., 95%)       Does not have one at home.  Protocols used: Coronavirus (COVID-19) Diagnosed or Suspected-A-AH

## 2023-12-30 NOTE — Telephone Encounter (Signed)
 Appt made.

## 2024-01-05 ENCOUNTER — Other Ambulatory Visit: Payer: Self-pay | Admitting: Family Medicine

## 2024-01-05 DIAGNOSIS — F418 Other specified anxiety disorders: Secondary | ICD-10-CM

## 2024-01-05 DIAGNOSIS — R232 Flushing: Secondary | ICD-10-CM

## 2024-01-07 ENCOUNTER — Encounter: Payer: Self-pay | Admitting: Family Medicine

## 2024-01-07 DIAGNOSIS — R232 Flushing: Secondary | ICD-10-CM

## 2024-01-07 DIAGNOSIS — F418 Other specified anxiety disorders: Secondary | ICD-10-CM

## 2024-01-12 MED ORDER — VENLAFAXINE HCL ER 37.5 MG PO CP24: 37.5000 mg | ORAL_CAPSULE | Freq: Every day | ORAL | 3 refills | Status: AC

## 2024-01-19 ENCOUNTER — Ambulatory Visit: Admitting: Neurology

## 2024-02-09 ENCOUNTER — Ambulatory Visit: Admitting: Family Medicine

## 2024-02-11 ENCOUNTER — Encounter (HOSPITAL_BASED_OUTPATIENT_CLINIC_OR_DEPARTMENT_OTHER): Payer: Self-pay | Admitting: Orthopedic Surgery

## 2024-02-14 ENCOUNTER — Encounter (HOSPITAL_BASED_OUTPATIENT_CLINIC_OR_DEPARTMENT_OTHER): Payer: Self-pay | Admitting: Orthopedic Surgery

## 2024-02-14 ENCOUNTER — Other Ambulatory Visit: Payer: Self-pay

## 2024-02-16 ENCOUNTER — Encounter (HOSPITAL_BASED_OUTPATIENT_CLINIC_OR_DEPARTMENT_OTHER)
Admission: RE | Admit: 2024-02-16 | Discharge: 2024-02-16 | Disposition: A | Discharge status: Home / Self Care | Source: Ambulatory Visit | Attending: Orthopedic Surgery | Admitting: Orthopedic Surgery

## 2024-02-16 DIAGNOSIS — Z01818 Encounter for other preprocedural examination: Secondary | ICD-10-CM | POA: Diagnosis present

## 2024-02-16 LAB — BASIC METABOLIC PANEL WITH GFR
Anion gap: 10 (ref 5–15)
BUN: 9 mg/dL (ref 6–20)
CO2: 24 mmol/L (ref 22–32)
Calcium: 9.2 mg/dL (ref 8.9–10.3)
Chloride: 104 mmol/L (ref 98–111)
Creatinine, Ser: 0.7 mg/dL (ref 0.44–1.00)
GFR, Estimated: >60 mL/min (ref >60)
Glucose, Bld: 112 mg/dL — ABNORMAL HIGH (ref 70–99)
Potassium: 4.4 mmol/L (ref 3.5–5.1)
Sodium: 138 mmol/L (ref 135–145)

## 2024-02-16 NOTE — Progress Notes (Signed)

## 2024-02-18 ENCOUNTER — Other Ambulatory Visit: Payer: Self-pay

## 2024-02-18 ENCOUNTER — Encounter (HOSPITAL_BASED_OUTPATIENT_CLINIC_OR_DEPARTMENT_OTHER): Payer: Self-pay | Admitting: Orthopedic Surgery

## 2024-02-18 ENCOUNTER — Encounter (HOSPITAL_BASED_OUTPATIENT_CLINIC_OR_DEPARTMENT_OTHER)
Admission: RE | Disposition: A | Discharge status: Home / Self Care | Payer: Self-pay | Source: Home / Self Care | Attending: Orthopedic Surgery

## 2024-02-18 ENCOUNTER — Ambulatory Visit (HOSPITAL_BASED_OUTPATIENT_CLINIC_OR_DEPARTMENT_OTHER)
Admission: RE | Admit: 2024-02-18 | Discharge: 2024-02-18 | Disposition: A | Discharge status: Home / Self Care | Attending: Orthopedic Surgery | Admitting: Orthopedic Surgery

## 2024-02-18 ENCOUNTER — Ambulatory Visit (HOSPITAL_COMMUNITY)

## 2024-02-18 ENCOUNTER — Ambulatory Visit (HOSPITAL_BASED_OUTPATIENT_CLINIC_OR_DEPARTMENT_OTHER): Admitting: Anesthesiology

## 2024-02-18 DIAGNOSIS — M84361A Stress fracture, right tibia, initial encounter for fracture: Secondary | ICD-10-CM | POA: Diagnosis not present

## 2024-02-18 DIAGNOSIS — S83242A Other tear of medial meniscus, current injury, left knee, initial encounter: Secondary | ICD-10-CM | POA: Insufficient documentation

## 2024-02-18 DIAGNOSIS — E1169 Type 2 diabetes mellitus with other specified complication: Secondary | ICD-10-CM

## 2024-02-18 DIAGNOSIS — X58XXXA Exposure to other specified factors, initial encounter: Secondary | ICD-10-CM | POA: Insufficient documentation

## 2024-02-18 DIAGNOSIS — M94262 Chondromalacia, left knee: Secondary | ICD-10-CM | POA: Diagnosis not present

## 2024-02-18 HISTORY — DX: Anxiety disorder, unspecified: F41.9

## 2024-02-18 HISTORY — DX: Other complications of anesthesia, initial encounter: T88.59XA

## 2024-02-18 HISTORY — DX: Hypothyroidism, unspecified: E03.9

## 2024-02-18 HISTORY — PX: KNEE ARTHROSCOPY WITH SUBCHONDROPLASTY: SHX6732

## 2024-02-18 HISTORY — PX: KNEE ARTHROSCOPY WITH MENISCAL REPAIR: SHX5653

## 2024-02-18 LAB — GLUCOSE, CAPILLARY
Glucose-Capillary: 122 mg/dL — ABNORMAL HIGH (ref 70–99)
Glucose-Capillary: 164 mg/dL — ABNORMAL HIGH (ref 70–99)

## 2024-02-18 SURGERY — ARTHROSCOPY, KNEE, WITH MENISCUS REPAIR
Anesthesia: Regional | Site: Knee | Laterality: Left

## 2024-02-18 MED ORDER — FENTANYL CITRATE (PF) 100 MCG/2ML IJ SOLN
INTRAMUSCULAR | Status: AC
  Filled 2024-02-18: qty 2

## 2024-02-18 MED ORDER — FENTANYL CITRATE (PF) 100 MCG/2ML IJ SOLN
INTRAMUSCULAR | Status: DC | PRN
  Administered 2024-02-18 (×2): 50 ug via INTRAVENOUS

## 2024-02-18 MED ORDER — ONDANSETRON 4 MG PO TBDP: 4.0000 mg | ORAL_TABLET | Freq: Three times a day (TID) | ORAL | 0 refills | Status: DC | PRN

## 2024-02-18 MED ORDER — OXYCODONE HCL 5 MG PO TABS: 5.0000 mg | ORAL_TABLET | Freq: Once | ORAL | Status: DC | PRN

## 2024-02-18 MED ORDER — FENTANYL CITRATE (PF) 100 MCG/2ML IJ SOLN
25.0000 ug | INTRAMUSCULAR | Status: DC | PRN
  Administered 2024-02-18 (×3): 50 ug via INTRAVENOUS

## 2024-02-18 MED ORDER — LIDOCAINE HCL (CARDIAC) PF 100 MG/5ML IV SOSY
PREFILLED_SYRINGE | INTRAVENOUS | Status: DC | PRN
  Administered 2024-02-18: 100 mg via INTRAVENOUS

## 2024-02-18 MED ORDER — CEFAZOLIN SODIUM-DEXTROSE 2-4 GM/100ML-% IV SOLN
2.0000 g | INTRAVENOUS | Status: AC
  Administered 2024-02-18: 2 g via INTRAVENOUS

## 2024-02-18 MED ORDER — HYDROMORPHONE HCL 1 MG/ML IJ SOLN
INTRAMUSCULAR | Status: AC
  Filled 2024-02-18: qty 0.5

## 2024-02-18 MED ORDER — OXYCODONE HCL 5 MG/5ML PO SOLN: 5.0000 mg | Freq: Once | ORAL | Status: DC | PRN

## 2024-02-18 MED ORDER — MIDAZOLAM HCL (PF) 2 MG/2ML IJ SOLN: 2.0000 mg | Freq: Once | INTRAMUSCULAR | Status: DC

## 2024-02-18 MED ORDER — DROPERIDOL 2.5 MG/ML IJ SOLN
INTRAMUSCULAR | Status: AC
  Filled 2024-02-18: qty 2

## 2024-02-18 MED ORDER — OXYCODONE HCL 5 MG PO TABS: 5.0000 mg | ORAL_TABLET | Freq: Four times a day (QID) | ORAL | 0 refills | Status: DC | PRN

## 2024-02-18 MED ORDER — ONDANSETRON HCL 4 MG/2ML IJ SOLN
INTRAMUSCULAR | Status: AC
  Filled 2024-02-18: qty 8

## 2024-02-18 MED ORDER — CEFAZOLIN SODIUM-DEXTROSE 2-4 GM/100ML-% IV SOLN
INTRAVENOUS | Status: AC
  Filled 2024-02-18: qty 100

## 2024-02-18 MED ORDER — DEXAMETHASONE SOD PHOSPHATE PF 10 MG/ML IJ SOLN
INTRAMUSCULAR | Status: DC | PRN
  Administered 2024-02-18: 10 mg

## 2024-02-18 MED ORDER — KETOROLAC TROMETHAMINE 30 MG/ML IJ SOLN
INTRAMUSCULAR | Status: DC | PRN
  Administered 2024-02-18: 30 mg via INTRAVENOUS

## 2024-02-18 MED ORDER — OXYCODONE HCL 5 MG PO TABS
ORAL_TABLET | ORAL | Status: AC
  Filled 2024-02-18: qty 1

## 2024-02-18 MED ORDER — DEXMEDETOMIDINE HCL IN NACL 80 MCG/20ML IV SOLN
INTRAVENOUS | Status: AC
  Filled 2024-02-18: qty 40

## 2024-02-18 MED ORDER — MIDAZOLAM HCL (PF) 2 MG/2ML IJ SOLN
1.0000 mg | Freq: Once | INTRAMUSCULAR | Status: AC
  Administered 2024-02-18: 2 mg via INTRAVENOUS

## 2024-02-18 MED ORDER — SODIUM CHLORIDE 0.9 % IR SOLN
Status: DC | PRN
  Administered 2024-02-18: 2200 mL

## 2024-02-18 MED ORDER — HYDROMORPHONE HCL 1 MG/ML IJ SOLN
0.5000 mg | INTRAMUSCULAR | Status: DC | PRN
  Administered 2024-02-18: 0.5 mg via INTRAVENOUS

## 2024-02-18 MED ORDER — EPINEPHRINE 1 MG/10ML IV SOSY
PREFILLED_SYRINGE | INTRAVENOUS | Status: DC | PRN
  Administered 2024-02-18: 1 mL via SUBCUTANEOUS

## 2024-02-18 MED ORDER — DROPERIDOL 2.5 MG/ML IJ SOLN
0.6250 mg | Freq: Once | INTRAMUSCULAR | Status: AC | PRN
  Administered 2024-02-18: 0.625 mg via INTRAVENOUS

## 2024-02-18 MED ORDER — ROCURONIUM BROMIDE 10 MG/ML (PF) SYRINGE
PREFILLED_SYRINGE | INTRAVENOUS | Status: AC
  Filled 2024-02-18: qty 20

## 2024-02-18 MED ORDER — EPINEPHRINE PF 1 MG/ML IJ SOLN
INTRAMUSCULAR | Status: AC
  Filled 2024-02-18: qty 1

## 2024-02-18 MED ORDER — PROPOFOL 10 MG/ML IV BOLUS
INTRAVENOUS | Status: DC | PRN
  Administered 2024-02-18: 200 mg via INTRAVENOUS

## 2024-02-18 MED ORDER — ROPIVACAINE HCL 5 MG/ML IJ SOLN
INTRAMUSCULAR | Status: DC | PRN
  Administered 2024-02-18: 30 mL via PERINEURAL

## 2024-02-18 MED ORDER — LACTATED RINGERS IV SOLN: INTRAVENOUS | Status: DC

## 2024-02-18 MED ORDER — FENTANYL CITRATE (PF) 100 MCG/2ML IJ SOLN: 100.0000 ug | Freq: Once | INTRAMUSCULAR | Status: DC

## 2024-02-18 MED ORDER — FENTANYL CITRATE (PF) 100 MCG/2ML IJ SOLN
50.0000 ug | Freq: Once | INTRAMUSCULAR | Status: AC
  Administered 2024-02-18: 50 ug via INTRAVENOUS

## 2024-02-18 MED ORDER — ONDANSETRON HCL 4 MG/2ML IJ SOLN
INTRAMUSCULAR | Status: DC | PRN
  Administered 2024-02-18: 4 mg via INTRAVENOUS

## 2024-02-18 MED ORDER — MIDAZOLAM HCL 2 MG/2ML IJ SOLN
INTRAMUSCULAR | Status: AC
  Filled 2024-02-18: qty 2

## 2024-02-18 MED ADMIN — Acetaminophen IV Soln 10 MG/ML: 1000 mg | INTRAVENOUS | NDC 63323043400

## 2024-02-18 SURGICAL SUPPLY — 49 items
ANCHOR SUT 1.8 FIBERTAK SB KL (Anchor) IMPLANT
BLADE SHAVER TORPEDO 4X13 (MISCELLANEOUS) ×1 IMPLANT
BNDG COMPR ESMARK 6X3 LF (GAUZE/BANDAGES/DRESSINGS) IMPLANT
BNDG ELASTIC 6INX 5YD STR LF (GAUZE/BANDAGES/DRESSINGS) ×1 IMPLANT
BURR OVAL 8 FLU 4.0X13 (MISCELLANEOUS) IMPLANT
BURR OVAL 8 FLU 5.0X13 (MISCELLANEOUS) IMPLANT
CANNULA 5.75X71 LONG (CANNULA) IMPLANT
CANNULA ACCUPORT 11G X 120 (CANNULA) IMPLANT
CANNULA TWIST IN 8.25X7CM (CANNULA) IMPLANT
CLSR STERI-STRIP ANTIMIC 1/2X4 (GAUZE/BANDAGES/DRESSINGS) ×1 IMPLANT
COOLER ICEMAN CLASSIC (MISCELLANEOUS) IMPLANT
COVER MAYO STAND STRL (DRAPES) ×1 IMPLANT
CUFF TRNQT CYL 34X4.125X (TOURNIQUET CUFF) ×1 IMPLANT
CUTTER BONE 4.0MM X 13CM (MISCELLANEOUS) IMPLANT
DRAPE C-ARM 35X43 STRL (DRAPES) ×1 IMPLANT
DRAPE INCISE IOBAN 66X45 STRL (DRAPES) IMPLANT
DRAPE U-SHAPE 47X51 STRL (DRAPES) ×1 IMPLANT
DRAPE-T ARTHROSCOPY W/POUCH (DRAPES) ×1 IMPLANT
DURAPREP 26ML APPLICATOR (WOUND CARE) ×1 IMPLANT
ELECTRODE REM PT RTRN 9FT ADLT (ELECTROSURGICAL) IMPLANT
FIBERSTICK 2 (SUTURE) IMPLANT
GAUZE PAD ABD 8X10 STRL (GAUZE/BANDAGES/DRESSINGS) ×1 IMPLANT
GAUZE SPONGE 4X4 12PLY STRL (GAUZE/BANDAGES/DRESSINGS) ×1 IMPLANT
GAUZE XEROFORM 1X8 LF (GAUZE/BANDAGES/DRESSINGS) ×1 IMPLANT
GLOVE BIO SURGEON STRL SZ7.5 (GLOVE) ×2 IMPLANT
GLOVE BIOGEL PI IND STRL 8 (GLOVE) ×2 IMPLANT
GOWN STRL REUS W/ TWL LRG LVL3 (GOWN DISPOSABLE) ×1 IMPLANT
GOWN STRL REUS W/ TWL XL LVL3 (GOWN DISPOSABLE) ×1 IMPLANT
GOWN STRL REUS W/TWL XL LVL3 (GOWN DISPOSABLE) ×2 IMPLANT
GRAFT FILLER BONE 5ML (Knees) IMPLANT
KIT STR SPEAR 1.8 FBRTK DISP (KITS) IMPLANT
KIT SUTLOC MENISCAL ROOT REP (Anchor) IMPLANT
MANIFOLD NEPTUNE II (INSTRUMENTS) ×1 IMPLANT
NDL SUT 2-0 SCORPION KNEE (NEEDLE) IMPLANT
NEEDLE SUT 2-0 SCORPION KNEE (NEEDLE) IMPLANT
PACK ARTHROSCOPY DSU (CUSTOM PROCEDURE TRAY) ×1 IMPLANT
PACK BASIN DAY SURGERY FS (CUSTOM PROCEDURE TRAY) ×1 IMPLANT
PAD COLD SHLDR SM WRAP-ON (PAD) IMPLANT
SHEET MEDIUM DRAPE 40X70 STRL (DRAPES) ×1 IMPLANT
SLEEVE SCD COMPRESS KNEE MED (STOCKING) ×1 IMPLANT
SUCTION TUBE FRAZIER 10FR DISP (SUCTIONS) IMPLANT
SUT ETHILON 4 0 PS 2 18 (SUTURE) ×1 IMPLANT
SUT MNCRL AB 3-0 PS2 18 (SUTURE) ×1 IMPLANT
SUT PDS AB 0 CT 36 (SUTURE) IMPLANT
SUTURE TAPE TIGERLINK 1.3MM BL (SUTURE) IMPLANT
TOWEL GREEN STERILE FF (TOWEL DISPOSABLE) ×1 IMPLANT
TUBE CONNECTING 20X1/4 (TUBING) IMPLANT
TUBING ARTHROSCOPY IRRIG 16FT (MISCELLANEOUS) ×1 IMPLANT
WAND ABLATOR APOLLO I90 (BUR) IMPLANT

## 2024-02-18 NOTE — Progress Notes (Signed)
Assisted Dr. Gavin Potters with left, adductor canal, ultrasound guided block. Side rails up, monitors on throughout procedure. See vital signs in flow sheet. Tolerated Procedure well.

## 2024-02-18 NOTE — Anesthesia Procedure Notes (Signed)
 Anesthesia Regional Block: Adductor canal block   Pre-Anesthetic Checklist: , timeout performed,  Correct Patient, Correct Site, Correct Laterality,  Correct Procedure, Correct Position, site marked,  Risks and benefits discussed,  Surgical consent,  Pre-op evaluation,  At surgeon's request and post-op pain management  Laterality: Left  Prep: Dura Prep       Needles:  Injection technique: Single-shot  Needle Type: Echogenic Stimulator Needle     Needle Length: 10cm  Needle Gauge: 20     Additional Needles:   Procedures:,,,, ultrasound used (permanent image in chart),,    Narrative:  Start time: 02/18/2024 8:30 AM End time: 02/18/2024 8:35 AM Injection made incrementally with aspirations every 5 mL.  Performed by: Personally  Anesthesiologist: Dorethea Cordella SQUIBB, DO  Additional Notes: Patient identified. Risks/Benefits/Options discussed with patient including but not limited to bleeding, infection, nerve damage, failed block, incomplete pain control. Patient expressed understanding and wished to proceed. All questions were answered. Sterile technique was used throughout the entire procedure. Please see nursing notes for vital signs. Aspirated in 5cc intervals with injection for negative confirmation. Patient was given instructions on fall risk and not to get out of bed. All questions and concerns addressed with instructions to call with any issues or inadequate analgesia.

## 2024-02-18 NOTE — Brief Op Note (Signed)
 02/18/2024  9:57 AM  PATIENT:  Christy Gay  48 y.o. female  PRE-OPERATIVE DIAGNOSIS:  Left knee medial meniscus root tear, medial tibial plateau fracture  POST-OPERATIVE DIAGNOSIS:  Left knee medial meniscus root tear, medial tibial plateau fracture  PROCEDURE:  Procedure(s) with comments: ARTHROSCOPY, KNEE, WITH MENISCUS REPAIR (Left) - Left knee arthroscopy with medial meniscus root repair with centralization and medial tibial plateau subchondroplasty ARTHROSCOPY, KNEE, WITH SUBCHONDROPLASTY (Left)  SURGEON:  Surgeons and Role:    * Sharl Selinda Dover, MD - Primary  PHYSICIAN ASSISTANT: Dayle Moores, PA-C  ANESTHESIA:   regional and general  EBL:  5 mL   BLOOD ADMINISTERED:none  DRAINS: none   LOCAL MEDICATIONS USED:  NONE  SPECIMEN:  No Specimen  DISPOSITION OF SPECIMEN:  N/A  COUNTS:  YES  TOURNIQUET:  * Missing tourniquet times found for documented tourniquets in log: [id number] *  DICTATION: .Note written in EPIC  PLAN OF CARE: Discharge to home after PACU  PATIENT DISPOSITION:  PACU - hemodynamically stable.   Delay start of Pharmacological VTE agent (>24hrs) due to surgical blood loss or risk of bleeding: not applicable

## 2024-02-18 NOTE — Anesthesia Procedure Notes (Signed)
 Procedure Name: LMA Insertion Date/Time: 02/18/2024 9:00 AM  Performed by: Donnell Berwyn SQUIBB, CRNAPre-anesthesia Checklist: Patient identified, Emergency Drugs available, Suction available, Patient being monitored and Timeout performed Patient Re-evaluated:Patient Re-evaluated prior to induction Oxygen Delivery Method: Circle system utilized Preoxygenation: Pre-oxygenation with 100% oxygen Induction Type: IV induction LMA: LMA inserted LMA Size: 4.0 Number of attempts: 1 Placement Confirmation: positive ETCO2 and breath sounds checked- equal and bilateral Tube secured with: Tape Dental Injury: Teeth and Oropharynx as per pre-operative assessment

## 2024-02-18 NOTE — Op Note (Signed)
 Surgery Date: 02/18/2024  Surgeon(s): Sharl Selinda Dover, MD  Assistant: Dayle Moores, PA-C  Assistant attestation:  PA Mcclung scrubbed and present for the entire procedure.  ANESTHESIA:  general, adductor canal regional anesthetic  FLUIDS: Per anesthesia record.   ESTIMATED BLOOD LOSS: minimal  Tourniquet: Nonsterile tourniquet on the left thigh at 275 mmHg for less than 1 hour  Implants: Arthrex suture lock anchor x 1 Arthrex fiber tack 1.8 mm x 1 Zimmer subchondroplasty flowable calcium  phosphate 5 cc  PREOPERATIVE DIAGNOSES:  1.   left knee medial root meniscus tear 2.  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  Left knee medial tibial plateau stress fracture  POSTOPERATIVE DIAGNOSES:  same  PROCEDURES PERFORMED:  1.  Left knee arthroscopy with limited synovectomy 2.  Left knee arthroscopy with medial meniscus repair of medial root tear with transosseous technique and centralization 3.  Left knee arthroscopic assisted percutaneous internal fixation of medial tibial plateau fracture  DESCRIPTION OF PROCEDURE: [name] is a 48 y.o.-year-old female with left knee medial root meniscus tear. Plans are to proceed with partial meniscectomy versus root repair with centralization, and diagnostic arthroscopy with debridement as indicated.  We did also discuss percutaneous internal fixation of the medial tibial plateau stress fracture.  Full discussion held regarding risks benefits alternatives and complications related surgical intervention. Conservative care options reviewed. All questions answered.  The patient was identified in the preoperative holding area and the operative extremity was marked. The patient was brought to the operating room and transferred to operating table in a supine position. Satisfactory general anesthesia was induced by anesthesiology.    Standard anterolateral, anteromedial arthroscopy portals were  obtained. The anteromedial portal was obtained with a spinal needle for localization under direct visualization with subsequent diagnostic findings.   Anteromedial and anterolateral chambers: moderate synovitis. The synovitis was debrided with a 4.5 mm full radius shaver through both the anteromedial and lateral portals.   Suprapatellar pouch and gutters: no synovitis or debris. Patella chondral surface: Grade 4 Trochlear chondral surface: Grade 3 Patellofemoral tracking: Midline and level Medial meniscus: Complete radial tear with 5 to 6 mm of medial extrusion.  However, healthy-appearing meniscal tissue otherwise..  Medial femoral condyle flexion bearing surface: Grade 2 Medial femoral condyle extension bearing surface: Grade 2 Medial tibial plateau: Grade 0 Anterior cruciate ligament:stable Posterior cruciate ligament:stable Lateral meniscus: Intact without tear.   Lateral femoral condyle flexion bearing surface: Grade 0 Lateral femoral condyle extension bearing surface: Grade 0 Lateral tibial plateau: Grade 0  After diagnostic arthroscopy then moved forward with the transosseous repair technique for the posterior meniscus root tear.  We first prepared the repair site with motorized shaver as well as the meniscal rasp.  Next we utilized the Arthrex point-to-point drill guide to place a drill pin at the repair site.  We then used this bony tunnel to pass a passing suture for our Arthrex suture lock anchor.  We then placed the suture lock anchor in the subcortical position.  This was set and had a nice subchondral position.  We then placed a vertical mattress with a white working suture in a horizontal mattress with a blue working suture in a Mason-Allen configuration.  We then converted these through the suture lock suture anchor and had excellent compression at the root.  However, at this point we noticed that the extrusion was still persisting.  Thus, we elected to add a centralization anchor  at the mid body of the medial meniscus.  We percutaneously placed a straight drill guide for the 1.8 mm Arthrex fiber tack knotless anchor.  We drilled this in place.  This was just off the free edge of the medial meniscus at the mid body.  We then used the working suture to grasp a healthy portion of the medial meniscus.  We then converted this through the knotless mechanism and this did nicely centralize the meniscus.  Suture limbs were all cut flush.   Next, we moved our attention to the percutaneous internal fixation of the medial tibial plateau stress fracture.  We utilized preoperative MRI to localize an AP and lateral for scopic image during surgery to place our drill pin in the central position of the stress fracture.  We noted that on the intra-articular portion there was no propagation of this into the joint surface.  Once we were satisfied with the position of the drill pin we then used our previously mixed Zimmer subchondroplasty flowable calcium  phosphate and injected 2.8 cc of flowable calcium  phosphate into the lesion.  We then checked on AP and lateral fluoroscopic images that there was no extravasation into the joint or the subcutaneous tissues.  This was allowed to cure for 8 minutes.  After completion of synovectomy, diagnostic exam, and debridements as described, all compartments were checked and no residual debris remained. Hemostasis was achieved with the cautery wand. The portals were approximated with buried monocryl. All excess fluid was expressed from the joint.  Xeroform sterile gauze dressings were applied followed by Ace bandage and ice pack.  Bledsoe brace was placed locked in full extension.  DISPOSITION: The patient was awakened from general anesthetic, extubated, taken to the recovery room in medically stable condition, no apparent complications.  She will be up to 50% weightbearing on the left lower extremity in full extension for 1 month.  She will wear the Bledsoe brace at  that time and will continue in the brace until she has good quad control.  We will limit flexion with weightbearing below 90 degrees for 6 months.  She will be on the root repair protocol.  I will see her back in the office in 2 weeks.  She will be on DVT prophylaxis with 81 mg aspirin x 6 weeks.

## 2024-02-18 NOTE — Transfer of Care (Signed)
 Immediate Anesthesia Transfer of Care Note  Patient: Christy Gay  Procedure(s) Performed: ARTHROSCOPY, KNEE, WITH MENISCUS REPAIR (Left: Knee) ARTHROSCOPY, KNEE, WITH SUBCHONDROPLASTY (Left: Knee)  Patient Location: PACU  Anesthesia Type:General and Regional  Level of Consciousness: awake  Airway & Oxygen Therapy: Patient Spontanous Breathing and Patient connected to face mask oxygen  Post-op Assessment: Report given to RN and Post -op Vital signs reviewed and stable  Post vital signs: Reviewed and stable  Last Vitals:  Vitals Value Taken Time  BP 142/89 02/18/24 10:08  Temp 36.2 C 02/18/24 10:08  Pulse 74 02/18/24 10:08  Resp 14 02/18/24 10:08  SpO2 98 % 02/18/24 10:08  Vitals shown include unfiled device data.  Last Pain:  Vitals:   02/18/24 0730  TempSrc: Tympanic  PainSc: 5       Patients Stated Pain Goal: 8 (02/18/24 0730)  Complications: No notable events documented.

## 2024-02-18 NOTE — Anesthesia Preprocedure Evaluation (Addendum)
 Anesthesia Evaluation  Patient identified by MRN, date of birth, ID band Patient awake    Reviewed: Allergy & Precautions, NPO status , Patient's Chart, lab work & pertinent test results  Airway Mallampati: II  TM Distance: >3 FB Neck ROM: Full    Dental no notable dental hx.    Pulmonary neg pulmonary ROS   Pulmonary exam normal        Cardiovascular negative cardio ROS  Rhythm:Regular Rate:Normal     Neuro/Psych  Headaches  Anxiety        GI/Hepatic negative GI ROS, Neg liver ROS,,,  Endo/Other  diabetesHypothyroidism    Renal/GU Renal disease  negative genitourinary   Musculoskeletal  (+) Arthritis , Osteoarthritis,    Abdominal Normal abdominal exam  (+)   Peds  Hematology   Anesthesia Other Findings   Reproductive/Obstetrics                              Anesthesia Physical Anesthesia Plan  ASA: 2  Anesthesia Plan: General and Regional   Post-op Pain Management: Regional block*   Induction: Intravenous  PONV Risk Score and Plan: 3 and Ondansetron , Dexamethasone , Midazolam  and Treatment may vary due to age or medical condition  Airway Management Planned: Mask and LMA  Additional Equipment: None  Intra-op Plan:   Post-operative Plan: Extubation in OR  Informed Consent: I have reviewed the patients History and Physical, chart, labs and discussed the procedure including the risks, benefits and alternatives for the proposed anesthesia with the patient or authorized representative who has indicated his/her understanding and acceptance.     Dental advisory given  Plan Discussed with: CRNA  Anesthesia Plan Comments:         Anesthesia Quick Evaluation

## 2024-02-18 NOTE — Anesthesia Postprocedure Evaluation (Signed)
 Anesthesia Post Note  Patient: Christy Gay  Procedure(s) Performed: ARTHROSCOPY, KNEE, WITH MENISCUS REPAIR (Left: Knee) ARTHROSCOPY, KNEE, WITH SUBCHONDROPLASTY (Left: Knee)     Patient location during evaluation: PACU Anesthesia Type: Regional and General Level of consciousness: awake and alert Pain management: pain level controlled Vital Signs Assessment: post-procedure vital signs reviewed and stable Respiratory status: spontaneous breathing, nonlabored ventilation, respiratory function stable and patient connected to nasal cannula oxygen Cardiovascular status: blood pressure returned to baseline and stable Postop Assessment: no apparent nausea or vomiting Anesthetic complications: no   No notable events documented.  Last Vitals:  Vitals:   02/18/24 1145 02/18/24 1248  BP: 113/71 119/76  Pulse: 70 65  Resp: 13 16  Temp:  (!) 36.2 C  SpO2: 94% 94%    Last Pain:  Vitals:   02/18/24 1248  TempSrc: Temporal  PainSc: 5                  Aseneth Hack P Jakaylah Schlafer

## 2024-02-18 NOTE — Discharge Instructions (Addendum)
 DISCHARGE INSTRUCTIONS: ________________________________________________________________________________   Elevate the leg above your heart as often as possible. Weight bear 50% with the Bledsoe brace, use crutches or walker as needed. You should sleep in the knee brace with it locked in full extension.  You may remove for showering.  Otherwise he may remove for exercise. Start normal showering on postoperative day #3.  Do not submerge underwater Use pain medication as needed.  You may also take Tylenol  and Advil  around-the-clock in alternating fashion in addition to the pain medication.  To prevent constipation use Colace 100mg . twice a day while on pain medication.  If constipated, use Miralax 17 gm once a day and drink plenty of fluids.  These medications can be obtained at the pharmacy without a prescription.   Follow up in the office in 14 days. You may remove your postoperative bandages on the third day from surgery and begin showering.  Do not remove the Steri-Strips.  Do not submerge underwater.  Replace your Ace bandage over your wounds before reapplying your knee brace You should also continue to wear the TED hose for 2 weeks postoperatively. You should also take an 81 mg aspirin once per day x 6 weeks for the prevention of DVT.   No Tylenol  before 3:20pm today. No NSAIDS (Ibuprofen /Motrin Jeronimo) before 5:45pm today.   Post Anesthesia Home Care Instructions  Activity: Get plenty of rest for the remainder of the day. A responsible individual must stay with you for 24 hours following the procedure.  For the next 24 hours, DO NOT: -Drive a car -Advertising copywriter -Drink alcoholic beverages -Take any medication unless instructed by your physician -Make any legal decisions or sign important papers.  Meals: Start with liquid foods such as gelatin or soup. Progress to regular foods as tolerated. Avoid greasy, spicy, heavy foods. If nausea and/or vomiting occur, drink only clear  liquids until the nausea and/or vomiting subsides. Call your physician if vomiting continues.  Special Instructions/Symptoms: Your throat may feel dry or sore from the anesthesia or the breathing tube placed in your throat during surgery. If this causes discomfort, gargle with warm salt water. The discomfort should disappear within 24 hours.  If you had a scopolamine  patch placed behind your ear for the management of post- operative nausea and/or vomiting:  1. The medication in the patch is effective for 72 hours, after which it should be removed.  Wrap patch in a tissue and discard in the trash. Wash hands thoroughly with soap and water. 2. You may remove the patch earlier than 72 hours if you experience unpleasant side effects which may include dry mouth, dizziness or visual disturbances. 3. Avoid touching the patch. Wash your hands with soap and water after contact with the patch.   Regional Anesthesia Blocks  1. You may not be able to move or feel the blocked extremity after a regional anesthetic block. This may last may last from 3-48 hours after placement, but it will go away. The length of time depends on the medication injected and your individual response to the medication. As the nerves start to wake up, you may experience tingling as the movement and feeling returns to your extremity. If the numbness and inability to move your extremity has not gone away after 48 hours, please call your surgeon.   2. The extremity that is blocked will need to be protected until the numbness is gone and the strength has returned. Because you cannot feel it, you will need to take extra care to  avoid injury. Because it may be weak, you may have difficulty moving it or using it. You may not know what position it is in without looking at it while the block is in effect.  3. For blocks in the legs and feet, returning to weight bearing and walking needs to be done carefully. You will need to wait until the  numbness is entirely gone and the strength has returned. You should be able to move your leg and foot normally before you try and bear weight or walk. You will need someone to be with you when you first try to ensure you do not fall and possibly risk injury.  4. Bruising and tenderness at the needle site are common side effects and will resolve in a few days.  5. Persistent numbness or new problems with movement should be communicated to the surgeon or the [facility] [phone] [facility] [phone]).
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
## 2024-02-21 ENCOUNTER — Encounter (HOSPITAL_BASED_OUTPATIENT_CLINIC_OR_DEPARTMENT_OTHER): Payer: Self-pay | Admitting: Orthopedic Surgery
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
## 2024-03-01 NOTE — Progress Notes (Unsigned)
 NEUROLOGY FOLLOW UP OFFICE NOTE  Christy Gay [id number]  Subjective:  Christy Gay is a 48 y.o. year old right-handed female with a medical history of DM, OA, migraines, CKD who we last saw on 12/02/23 for headaches.  To briefly review: 12/02/23: Patient used to have headaches in her 20s around her menstrual cycle. She would take excedrin for these. These went away many years ago. About 3 years she has shingles in her right eye. She recovered from this and about 1 month later she started having headaches again. It is a frontal headache, pounding, pulsating, throbbing pain. She has associated photophobia, phonophobia, nausea, but no vomiting. She rates the pain 8/10. She wants to lay in a cold dark room if able. The headache can last days. She has a dull headache almost everyday and a severe headache at least once per week.   She saw Dr. Oneita for 2.5 years at [facility] and was given muscle relaxers (baclofen and tizanidine). She was put on daily zonisamide, which may have helped some. She stopped taking this in 10/2023 and her headaches have not significantly changed. She also took gabapentin  in the past that she thinks helped but Dr. Oneita stopped it for zonisamide. She got trigger point injections but did not help. She may have also been prescribed Aimovig but she never took this because of a concern about ingredients contained that she is allergic to (polyethylene glycol). No sumatriptan due to concerns about potential allergic reactions per PCP note.    They can also be present sometimes first thing in the morning (2-3 times per week). The morning headaches are dull (2-3/10) but can develop into more severe headache.    Currently patient is on effexor  but is being weaned off, and will be off in another month. The plan per PCP was to switch to amitriptyline. She has been given samples of Nurtec recently by PCP but has not taken this yet.   Regarding sleep, she does not  snore, but groans and makes noise during her sleep. She does not feel well rested over the last year due to being kept up due to hot flashes. She does not feel tired throughout the day. She has been referred for a sleep study which is in process of being scheduled.   She cannot trigger symptoms by touching her face or brushing her teeth. It does not feel like electric shocks.   Smoker: No OCP use: None Caffiene use: Very rare coffee or pepsi EtOH use: No Restrictive diet: No Family history of neurologic disease, including headaches: Mother had migraines  Most recent Assessment and Plan (12/02/23): Christy Gay is a 48 y.o. female who presents for evaluation of headaches. She has a relevant medical history of DM, OA, migraines, CKD. Her neurological examination is essentially normal. Available diagnostic data is significant for normal MRI in 2024, normal B12, and normal TSH. While patient's headaches came back after shingles in the right eye, it is unclear if the two are directly related. Her headaches are not consistent with trigeminal neuralgia (last too long, not electric, cannot be triggered by touch). Her headaches sound more consistent with migraines without aura. She is currently having at least a dull headache everyday with a severe headache once per week. She has tried gabapentin  and zonisamide in the past for prevention that may have helped some but was still having a lot of headaches on them. She cannot take medications with polyethylene glycol due to anaphylaxis reaction  in the past, this would include triptans for rescue. For this reason, I will order Nurtec for rescue. For prevention, there was talk of amitriptyline by PCP but patient would have to wait while weaning effexor . Perhaps better would be to start topamax , which patient agrees with.   PLAN: -For migraines: Migraine prevention:  Start Topamax  25 mg at bedtime Migraine rescue:  Start Nurtec 75 mg as needed at headache onset,  will start approval process. Patient has samples from PCP. Limit use of pain relievers to no more than 2 days out of week to prevent risk of rebound or medication-overuse headache. Keep headache diary Agree with sleep study and treating OSA if present  Since their last visit: Patient needed topamax  sprinkles due to polyethylene glycol being in the capsules. Nurtec was approved. ***   Headaches?  Current medications: Side effects:  Sleep study?   MEDICATIONS:  Outpatient Encounter Medications as of 03/03/2024  Medication Sig   Blood Glucose Monitoring Suppl (ACCU-CHEK GUIDE ME) w/Device KIT CHECK BLOOD GLUCOSE DAILY E11.69.   Blood Glucose Monitoring Suppl DEVI Check BGs daily E11.69. May substitute to any manufacturer covered by patient's insurance.   Dapsone  5 % topical gel APPLY PEA SIZED AMOUNT TO THE FACE TWICE DAILY FOR ACNE AS DIRECTED.   dicyclomine  (BENTYL ) 20 MG tablet Take 20 mg by mouth 2 (two) times daily. (Patient not taking: No sig reported)   Fezolinetant  (VEOZAH ) 45 MG TABS Take 1 tablet (45 mg total) by mouth daily. For hot flashes   fluticasone  (FLONASE ) 50 MCG/ACT nasal spray Place into both nostrils daily.   Glucose Blood (BLOOD GLUCOSE TEST STRIPS) STRP Check BGs daily E11.69. May substitute to any manufacturer covered by patient's insurance.   Lancet Device MISC Check BGs daily E11.69.May substitute to any manufacturer covered by patient's insurance.   Lancets (ONETOUCH DELICA PLUS LANCET33G) MISC Apply topically daily.   Lancets Misc. MISC Check BGs daily E11.69.May substitute to any manufacturer covered by patient's insurance.   levothyroxine  (SYNTHROID ) 50 MCG tablet Take 1 tablet (50 mcg total) by mouth daily before breakfast.   meloxicam (MOBIC) 7.5 MG tablet TAKE 1 TABLET BY MOUTH TWICE A DAY FOR 2 WEEKS AND THEN AS NEEDED (Patient taking differently: Take 10 mg by mouth daily.)   ondansetron  (ZOFRAN -ODT) 4 MG disintegrating tablet Take 1 tablet (4 mg  total) by mouth every 8 (eight) hours as needed for vomiting or nausea.   oxyCODONE  (ROXICODONE ) 5 MG immediate release tablet Take 1 tablet (5 mg total) by mouth every 6 (six) hours as needed for moderate pain (pain score 4-6).   Rimegepant Sulfate (NURTEC) 75 MG TBDP Take 1 tablet (75 mg total) by mouth as needed.   topiramate  (TOPAMAX  SPRINKLE) 25 MG capsule Take 1 capsule (25 mg total) by mouth at bedtime.   venlafaxine  XR (EFFEXOR  XR) 37.5 MG 24 hr capsule Take 1 capsule (37.5 mg total) by mouth daily with breakfast.   No facility-administered encounter medications on file as of 03/03/2024.    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   Complication of anesthesia    difficulty waking up per patientt   Diabetes mellitus without complication (HCC)    gestational with first pregnancy   Headache(784.0)    migraines   Hypothyroidism    Morbid (severe) obesity due to excess calories (HCC) 07/30/2023  bmi 42.98   Normal pregnancy in multigravida 10/11/2013   IMO SNOMED Dx Update Oct 2024     S/P cesarean section 10/12/2013    PAST SURGICAL HISTORY: Past Surgical History:  Procedure Laterality Date   CALCANEAL OSTEOTOMY Left 10/14/2017   Procedure: Right Calcaneal Osteotomy;  Surgeon: Kit Rush, MD;  Location: [facility];  Service: Orthopedics;  Laterality: Left;   CERVICAL ABLATION     CESAREAN SECTION     CESAREAN SECTION N/A 10/12/2013   Procedure: CESAREAN SECTION;  Surgeon: Ezzie Buba, MD;  Location: [facility];  Service: Obstetrics;  Laterality: N/A;   GASTROCNEMIUS RECESSION Right 10/14/2017   Procedure: Right Gastroc Recession;  Surgeon: Kit Rush, MD;  Location: [facility];  Service: Orthopedics;  Laterality: Right;   KIDNEY SURGERY     KNEE ARTHROSCOPY WITH MENISCAL REPAIR Left 02/18/2024   Procedure:  ARTHROSCOPY, KNEE, WITH MENISCUS REPAIR;  Surgeon: Sharl Selinda Dover, MD;  Location: [facility];  Service: Orthopedics;  Laterality: Left;  Left knee arthroscopy with medial meniscus root repair with centralization and medial tibial plateau subchondroplasty   KNEE ARTHROSCOPY WITH SUBCHONDROPLASTY Left 02/18/2024   Procedure: ARTHROSCOPY, KNEE, WITH SUBCHONDROPLASTY;  Surgeon: Sharl Selinda Dover, MD;  Location: [facility];  Service: Orthopedics;  Laterality: Left;   LIGAMENT REPAIR Right 10/14/2017   Procedure: Right Spring Ligament Repair;  Surgeon: Kit Rush, MD;  Location: [facility];  Service: Orthopedics;  Laterality: Right;   TENOLYSIS Right 10/14/2017   Procedure: Right Posterior Tibial Tenolysis;  Surgeon: Kit Rush, MD;  Location: [facility];  Service: Orthopedics;  Laterality: Right;   TONSILLECTOMY     TUBAL LIGATION Bilateral 10/12/2013   Procedure: BILATERAL TUBAL LIGATION;  Surgeon: Ezzie Buba, MD;  Location: [facility];  Service: Obstetrics;  Laterality: Bilateral;    ALLERGIES: Allergies  Allergen Reactions   Covid-19 (Subunit) Vaccine Shortness Of Breath   Polyethylene Glycol Shortness Of Breath and Itching   Sumatriptan Anaphylaxis   Ozempic (0.25 Or 0.5 Mg-Dose) [Semaglutide(0.25 Or 0.5mg -Dos)] Other (See Comments)    pancreatitis   Cephalexin Rash and Dermatitis   Jardiance [Empagliflozin] Itching    Vaginitis    FAMILY HISTORY: Family History  Problem Relation Age of Onset   Heart disease Mother        RHD   Colon cancer Neg Hx    Colon polyps Neg Hx    Esophageal cancer Neg Hx    Rectal cancer Neg Hx    Stomach cancer Neg Hx     SOCIAL HISTORY: Social History   Tobacco Use   Smoking status: Never   Smokeless tobacco: Never  Vaping Use   Vaping status: Never Used  Substance Use Topics   Alcohol use: No   Drug use: Never   Social History   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  Married.     Are you right handed or left handed? Right   Do you live at home alone?   Who lives with you? family   What type of home do you live in: 1 story or 2 story? Two     Caffiene none      Objective:  Vital Signs:  LMP 01/07/2020   ***  Labs and Imaging review: New results: BMP (02/16/24): significant for glucose 112  Home sleep study (02/09/24): No significant sleep apnea  Previously reviewed results: 11/19/23: TSH wnl Lipid panel:  tChol 281, LDL 199, TG 142 CMP significant for glucose 136 HbA1c: 6.6   B12 ((04/26/23): 705 Vit D (10/16/22) wnl   Imaging/Procedures: MRI brain wo contrast (08/22/22 for headaches): IMPRESSION: Unremarkable non-contrast MRI appearance of the brain. No evidence of an acute intracranial abnormality.  Assessment/Plan:  This is Lauraine KATHEE Fisherman, a 48 y.o. female with: ***   Plan: ***  Return to clinic in ***  Total time spent reviewing records, interview, history/exam, documentation, and coordination of care on day of encounter:  *** min  Venetia Potters, MD

## 2024-03-02 ENCOUNTER — Telehealth: Payer: Self-pay

## 2024-03-02 ENCOUNTER — Ambulatory Visit: Admitting: Neurology

## 2024-03-02 NOTE — Telephone Encounter (Unsigned)
 Copied from CRM [id number]. Topic: Appointments - Scheduling Inquiry for Clinic >> Mar 02, 2024 12:08 PM Harlene ORN wrote: Reason for CRM: Patient wants to know if PCP is covered under [employer] Please message back through MYChart

## 2024-03-03 ENCOUNTER — Encounter: Payer: Self-pay | Admitting: Neurology

## 2024-03-03 ENCOUNTER — Ambulatory Visit: Admitting: Neurology

## 2024-03-03 VITALS — BP 132/85 | HR 106 | Ht 62.0 in | Wt 256.0 lb

## 2024-03-03 DIAGNOSIS — G43709 Chronic migraine without aura, not intractable, without status migrainosus: Secondary | ICD-10-CM

## 2024-03-03 NOTE — Patient Instructions (Signed)
 We will make no changes today.  Migraine prevention:  Continue Topamax  sprinkles 25 mg daily Migraine rescue:  Continue Nurtec 75 mg as need at headache onset Limit use of pain relievers to no more than 2 days out of week to prevent risk of rebound or medication-overuse headache. Keep headache diary  Return to clinic in 6 months  Please let me know if you have any questions or concerns in the meantime.  The physicians and staff at [facility] are committed to providing excellent care. You may receive a survey requesting feedback about your experience at our office. We strive to receive very good responses to the survey questions. If you feel that your experience would prevent you from giving the office a very good  response, please contact our office to try to remedy the situation. We may be reached at [phone]. Thank you for taking the time out of your busy day to complete the survey.  Venetia Potters, MD [facility]

## 2024-03-06 ENCOUNTER — Ambulatory Visit: Admitting: Family Medicine

## 2024-03-13 ENCOUNTER — Encounter: Payer: Self-pay | Admitting: Family Medicine

## 2024-03-13 ENCOUNTER — Ambulatory Visit: Payer: Self-pay | Admitting: Family Medicine

## 2024-03-13 VITALS — BP 121/72 | HR 80 | Temp 97.8°F | Ht 62.0 in | Wt 261.2 lb

## 2024-03-13 DIAGNOSIS — E1169 Type 2 diabetes mellitus with other specified complication: Secondary | ICD-10-CM

## 2024-03-13 DIAGNOSIS — E785 Hyperlipidemia, unspecified: Secondary | ICD-10-CM | POA: Diagnosis not present

## 2024-03-13 DIAGNOSIS — F418 Other specified anxiety disorders: Secondary | ICD-10-CM | POA: Diagnosis not present

## 2024-03-13 DIAGNOSIS — G43001 Migraine without aura, not intractable, with status migrainosus: Secondary | ICD-10-CM

## 2024-03-13 LAB — BAYER DCA HB A1C WAIVED: HB A1C (BAYER DCA - WAIVED): 5.3 % (ref 4.8–5.6)

## 2024-03-13 NOTE — Progress Notes (Signed)
 Subjective: RR:fnni/ migraines/ DM PCP: Jolinda Norene HERO, DO YEP:Djmjy B Hightower is a 48 y.o. female presenting to clinic today for:  Anxiety/ depression/ migraines Migraines managed by neurology.  Transitioned recently from amitriptyline to Topamax . Patient wanted to stay on Effexor  for treatment of mood.  Mood is stable.  She is actually on Topamax  sprinkles due to PEG allergy.  Migraines certainly seem to be under better control now.  She is seeing Dr. Leigh with neurology  Type 2 Diabetes with hyperlipidemia:  Average blood sugars running 170s.  She admits that she really has not been physically active since July due to injured meniscus on the left knee.  She is status post surgical intervention and is currently in a brace.  She sees orthopedics in about 2 weeks to hopefully release her from the brace.  She is not modifying her diet to keep blood sugar low.  She is not currently treated with any medications for blood sugar as she has been diet controlled previously.  Last eye exam: UTD Last foot exam: UTD Last A1c:  Lab Results  Component Value Date   HGBA1C 6.6 (H) 11/19/2023   Nephropathy screen indicated?: UTD Last flu, zoster and/or pneumovax:  Immunization History  Administered Date(s) Administered   DTaP 11/08/1973, 01/09/1974, 02/22/1975, 11/16/1978   Dtap, Unspecified 11/08/1973, 01/09/1974, 02/22/1975, 11/16/1978   IPV 11/08/1973, 01/09/1974, 02/22/1975, 11/16/1978   Influenza, Seasonal, Injecte, Preservative Fre 04/11/2013   Influenza,inj,Quad PF,6+ Mos 02/28/2014, 03/27/2015, 03/25/2016, 03/14/2018, 03/15/2019   MMR 11/13/1992   PFIZER(Purple Top)SARS-COV-2 Vaccination 07/09/2019   PNEUMOCOCCAL CONJUGATE-20 11/19/2023   Polio, Unspecified 11/08/1973, 01/09/1974, 02/22/1975, 11/16/1978   Rabies, IM 02/03/2023, 02/06/2023, 02/10/2023, 02/17/2023   Td 10/18/1988, 06/05/1997   Td (Adult),unspecified 10/18/1988, 06/05/1997   Tdap 08/03/2013, 02/03/2023    ROS: No  chest pain, shortness of breath or visual disturbance reported.  Sometimes she feels like she has fluid on the left ear.  Though does not report any drainage or bleeding.  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 grade   Complication of anesthesia    difficulty waking up per patientt   Diabetes mellitus without complication (HCC)    gestational with first pregnancy   Headache(784.0)    migraines   Hypothyroidism    Morbid (severe) obesity due to excess calories (HCC) 07/30/2023   bmi 42.98   Normal pregnancy in multigravida 10/11/2013   IMO SNOMED Dx Update Oct 2024     S/P cesarean section 10/12/2013    Current Outpatient Medications:    Blood Glucose Monitoring Suppl (ACCU-CHEK GUIDE ME) w/Device KIT, CHECK BLOOD GLUCOSE DAILY E11.69., Disp: , Rfl:    Blood Glucose Monitoring Suppl DEVI, Check BGs daily E11.69. May substitute to any manufacturer covered by patient's insurance., Disp: 1 each, Rfl: 0   Dapsone  5 % topical gel, APPLY PEA SIZED AMOUNT TO THE FACE TWICE DAILY FOR ACNE AS DIRECTED., Disp: 60 g, Rfl: 12   dicyclomine  (BENTYL ) 20 MG  tablet, Take 20 mg by mouth 2 (two) times daily. (Patient not taking: Reported on 03/03/2024), Disp: , Rfl:    Fezolinetant  (VEOZAH ) 45 MG TABS, Take 1 tablet (45 mg total) by mouth daily. For hot flashes (Patient not taking: Reported on 03/03/2024), Disp: 90 tablet, Rfl: 3   fluticasone  (FLONASE ) 50 MCG/ACT nasal spray, Place into both nostrils daily.,  Disp: , Rfl:    Glucose Blood (BLOOD GLUCOSE TEST STRIPS) STRP, Check BGs daily E11.69. May substitute to any manufacturer covered by patient's insurance., Disp: 100 strip, Rfl: 3   Lancet Device MISC, Check BGs daily E11.69.May substitute to any manufacturer covered by patient's insurance., Disp: 1 each, Rfl: 0   Lancets (ONETOUCH DELICA PLUS LANCET33G) MISC, Apply topically daily., Disp: , Rfl:    Lancets Misc. MISC, Check BGs daily E11.69.May substitute to any manufacturer covered by patient's insurance., Disp: 100 each, Rfl: 3   levothyroxine  (SYNTHROID ) 50 MCG tablet, Take 1 tablet (50 mcg total) by mouth daily before breakfast., Disp: 90 tablet, Rfl: 3   meloxicam (MOBIC) 7.5 MG tablet, TAKE 1 TABLET BY MOUTH TWICE A DAY FOR 2 WEEKS AND THEN AS NEEDED (Patient not taking: Reported on 03/03/2024), Disp: , Rfl:    ondansetron  (ZOFRAN -ODT) 4 MG disintegrating tablet, Take 1 tablet (4 mg total) by mouth every 8 (eight) hours as needed for vomiting or nausea., Disp: 20 tablet, Rfl: 0   oxyCODONE  (ROXICODONE ) 5 MG immediate release tablet, Take 1 tablet (5 mg total) by mouth every 6 (six) hours as needed for moderate pain (pain score 4-6)., Disp: 25 tablet, Rfl: 0   Rimegepant Sulfate (NURTEC) 75 MG TBDP, Take 1 tablet (75 mg total) by mouth as needed., Disp: 30 tablet, Rfl: 5   topiramate  (TOPAMAX  SPRINKLE) 25 MG capsule, Take 1 capsule (25 mg total) by mouth at bedtime., Disp: 30 capsule, Rfl: 5   venlafaxine  XR (EFFEXOR  XR) 37.5 MG 24 hr capsule, Take 1 capsule (37.5 mg total) by mouth daily with breakfast., Disp: 90 capsule, Rfl: 3 Social History   Socioeconomic History   Marital status: Married    Spouse name: Not on file   Number of children: Not on file   Years of education: Not on file   Highest education level: Not on file  Occupational History   Not on file  Tobacco Use   Smoking status: Never   Smokeless tobacco: Never  Vaping Use   Vaping status: Never Used  Substance and  Sexual Activity   Alcohol use: No   Drug use: Never   Sexual activity: Not on file  Other Topics Concern   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     Are you right handed or left handed? Right   Do you live at home alone?   Who lives with you? family   What type of home do you live in: 1 story or 2 story? Two     Caffiene none   Social Drivers of Corporate Investment Banker Strain: Not on file  Food Insecurity: No Food Insecurity (04/26/2023)   Hunger Vital Sign    Worried About Running Out of Food in the Last Year: Never true    Ran Out of Food in the Last Year: Never true  Transportation Needs: Not on file  Physical Activity: Not on file  Stress: Not on file  Social Connections: Not on file  Intimate Partner Violence: Not  on file   Family History  Problem Relation Age of Onset   Heart disease Mother        RHD   Colon cancer Neg Hx    Colon polyps Neg Hx    Esophageal cancer Neg Hx    Rectal cancer Neg Hx    Stomach cancer Neg Hx     Objective: Office vital signs reviewed. BP 121/72   Pulse 80   Temp 97.8 F (36.6 C)   Ht 5' 2 (1.575 m)   Wt 261 lb 4 oz (118.5 kg)   LMP 01/07/2020   SpO2 96%   BMI 47.78 kg/m   Physical Examination:  General: Awake, alert, morbidly obese, No acute distress HEENT: Sclera white.  Moist mucous membranes Cardio: regular rate and rhythm, S1S2 heard, no murmurs appreciated Pulm: clear to auscultation bilaterally, no wheezes, rhonchi or rales; normal work of breathing on room air MSK: Antalgic gait, utilizing crutches.  Left leg is in a brace that extends from the thigh to the ankle  Assessment/ Plan: 48 y.o. female   Migraine without aura and with status migrainosus, not intractable  Anxious depression  Type 2 diabetes mellitus with other specified complication, without long-term current use of insulin (HCC) - Plan: Bayer DCA Hb A1c Waived,  CMP14+EGFR  Hyperlipidemia associated with type 2 diabetes mellitus (HCC) - Plan: CMP14+EGFR, Lipid Panel   Migraines are better controlled with Topamax  Sprinkle's.  Continue to follow-up with neurology as directed.  Mood stable with Effexor .  Continue current regimen  Nonfasting labs collected today.  Anticipate possible need for treatment but has intolerances to Jardiance (vaginitis), Ozempic (pancreatitis) and any other PEG containing product   Charda Janis CHRISTELLA Fielding, DO [facility] [phone]

## 2024-03-14 ENCOUNTER — Ambulatory Visit: Payer: Self-pay | Admitting: Family Medicine

## 2024-03-14 LAB — LIPID PANEL
Chol/HDL Ratio: 5 ratio — ABNORMAL HIGH (ref 0.0–4.4)
Cholesterol, Total: 261 mg/dL — ABNORMAL HIGH (ref 100–199)
HDL: 52 mg/dL (ref >39)
LDL Chol Calc (NIH): 166 mg/dL — ABNORMAL HIGH (ref 0–99)
Triglycerides: 229 mg/dL — ABNORMAL HIGH (ref 0–149)
VLDL Cholesterol Cal: 43 mg/dL — ABNORMAL HIGH (ref 5–40)

## 2024-03-14 LAB — CMP14+EGFR
ALT: 21 IU/L (ref 0–32)
AST: 16 IU/L (ref 0–40)
Albumin: 4.4 g/dL (ref 3.9–4.9)
Alkaline Phosphatase: 75 IU/L (ref 41–116)
BUN/Creatinine Ratio: 22 (ref 9–23)
BUN: 15 mg/dL (ref 6–24)
Bilirubin Total: 0.2 mg/dL (ref 0.0–1.2)
CO2: 22 mmol/L (ref 20–29)
Calcium: 9.3 mg/dL (ref 8.7–10.2)
Chloride: 104 mmol/L (ref 96–106)
Creatinine, Ser: 0.69 mg/dL (ref 0.57–1.00)
Globulin, Total: 2.1 g/dL (ref 1.5–4.5)
Glucose: 82 mg/dL (ref 70–99)
Potassium: 4.3 mmol/L (ref 3.5–5.2)
Sodium: 139 mmol/L (ref 134–144)
Total Protein: 6.5 g/dL (ref 6.0–8.5)
eGFR: 106 mL/min/1.73 (ref >59)

## 2024-04-03 ENCOUNTER — Ambulatory Visit: Payer: Self-pay

## 2024-04-03 ENCOUNTER — Encounter: Payer: Self-pay | Admitting: Family Medicine

## 2024-04-03 NOTE — Telephone Encounter (Signed)
 Pt has appt

## 2024-04-03 NOTE — Telephone Encounter (Signed)
 FYI Only or Action Required?: FYI only for provider: appointment scheduled on 04/04/24.  Patient was last seen in primary care on 03/13/2024 by Jolinda Norene HERO, DO.  Called Nurse Triage reporting Cough.  Symptoms began several days ago.  Interventions attempted: OTC medications: Mucinex, Tylenol , Motrin .  Symptoms are: gradually worsening.  Triage Disposition: See HCP Within 4 Hours (Or PCP Triage)  Patient/caregiver understands and will follow disposition?: Yes, pt requests appt tomorrow   Copied from CRM [id number]. Topic: Clinical - Red Word Triage >> Apr 03, 2024 12:52 PM Fredrica W wrote: Red Word that prompted transfer to Nurse Triage: shortness of breath, wheezing, coughing Reason for Disposition  [1] MILD difficulty breathing (e.g., minimal/no SOB at rest, SOB with walking, pulse < 100) AND [2] still present when not coughing  Answer Assessment - Initial Assessment Questions 1. ONSET: When did the cough begin?      Friday', worsening since 3. SPUTUM: Describe the color of your sputum (e.g., none, dry cough; clear, white, yellow, green)     Minimum, clear 4. HEMOPTYSIS: Are you coughing up any blood? If Yes, ask: How much? (e.g., flecks, streaks, tablespoons, etc.)     None 5. DIFFICULTY BREATHING: Are you having difficulty breathing? If Yes, ask: How bad is it? (e.g., mild, moderate, severe)      SOB with exertion, resolved with rest 6. FEVER: Do you have a fever? If Yes, ask: What is your temperature, how was it measured, and when did it start?     Over the weekend at highest 101 7. CARDIAC HISTORY: Do you have any history of heart disease? (e.g., heart attack, congestive heart failure)      None 8. LUNG HISTORY: Do you have any history of lung disease?  (e.g., pulmonary embolus, asthma, emphysema)     None 9. PE RISK FACTORS: Do you have a history of blood clots? (or: recent major surgery, recent prolonged travel, bedridden)     Knee surgery  10/17 10. OTHER SYMPTOMS: Do you have any other symptoms? (e.g., runny nose, wheezing, chest pain)       Runny nose, sore throat, chest congestion 12. TRAVEL: Have you traveled out of the country in the last month? (e.g., travel history, exposures)       None  Protocols used: Cough - Acute Non-Productive-A-AH

## 2024-04-04 ENCOUNTER — Ambulatory Visit: Admitting: Family

## 2024-04-04 VITALS — BP 120/78 | HR 80 | Temp 98.0°F | Ht 62.0 in | Wt 263.4 lb

## 2024-04-04 DIAGNOSIS — J209 Acute bronchitis, unspecified: Secondary | ICD-10-CM

## 2024-04-04 MED ORDER — ALBUTEROL SULFATE HFA 108 (90 BASE) MCG/ACT IN AERS: 2.0000 | INHALATION_SPRAY | Freq: Four times a day (QID) | RESPIRATORY_TRACT | 0 refills | Status: AC | PRN

## 2024-04-04 MED ORDER — PROMETHAZINE-DM 6.25-15 MG/5ML PO SYRP: 5.0000 mL | ORAL_SOLUTION | Freq: Three times a day (TID) | ORAL | 0 refills | Status: AC | PRN

## 2024-04-04 MED ORDER — BENZONATATE 200 MG PO CAPS: 200.0000 mg | ORAL_CAPSULE | Freq: Two times a day (BID) | ORAL | 0 refills | Status: AC | PRN

## 2024-04-04 MED ORDER — PREDNISONE 10 MG (21) PO TBPK: ORAL_TABLET | ORAL | 0 refills | Status: AC

## 2024-04-04 NOTE — Patient Instructions (Signed)

## 2024-04-04 NOTE — Progress Notes (Signed)
 Subjective:    Patient ID: Christy Gay, female    DOB: 01-Jan-1976, 48 y.o.   MRN: [id number]  Chief Complaint  Patient presents with   Cough    X 5 days negative covid test.   Wheezing   Shortness of Breath   Pt presents to the office today with cough that started 5 days ago and has worsen. Had negative COVID and flu test at home.  Cough This is a new problem. The current episode started 1 to 4 weeks ago. The problem has been gradually worsening. The problem occurs every few minutes. The cough is Non-productive. Associated symptoms include a fever (improved), headaches, myalgias, nasal congestion, postnasal drip, shortness of breath and wheezing. Pertinent negatives include no chills, ear congestion, ear pain, rhinorrhea or sore throat. She has tried rest and OTC cough suppressant for the symptoms. The treatment provided mild relief.  Wheezing  Associated symptoms include coughing, a fever (improved), headaches and shortness of breath. Pertinent negatives include no chills, ear pain, rhinorrhea or sore throat.  Shortness of Breath Associated symptoms include a fever (improved), headaches and wheezing. Pertinent negatives include no ear pain, rhinorrhea or sore throat.      Review of Systems  Constitutional:  Positive for fever (improved). Negative for chills.  HENT:  Positive for postnasal drip. Negative for ear pain, rhinorrhea and sore throat.   Respiratory:  Positive for cough, shortness of breath and wheezing.   Musculoskeletal:  Positive for myalgias.  Neurological:  Positive for headaches.  All other systems reviewed and are negative.   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     Are you right handed or left handed? Right   Do you live at home alone?   Who lives with you? family   What type of home do you live in: 1 story or 2 story? Two     Caffiene none   Social Drivers of Corporate Investment Banker Strain: Low Risk  (04/04/2024)   Overall Financial Resource Strain (CARDIA)    Difficulty of Paying Living Expenses: Not hard at all  Food Insecurity: No Food Insecurity (04/04/2024)   Hunger Vital Sign    Worried About Running Out of Food in the Last Year: Never true    Ran Out of Food in the Last Year: Never true  Transportation Needs: No Transportation Needs (04/04/2024)   PRAPARE - Administrator, Civil Service (Medical): No    Lack of Transportation (Non-Medical): No  Physical Activity: Sufficiently Active (04/04/2024)   Exercise Vital Sign    Days of Exercise per Week: 6 days    Minutes of Exercise per Session: 60 min  Stress: Stress Concern Present (04/04/2024)   [employer] of Occupational Health - Occupational Stress Questionnaire  Feeling of Stress: To some extent  Social Connections: Socially Integrated (04/04/2024)   Social Connection and Isolation Panel    Frequency of Communication with Friends and Family: More than three times a week    Frequency of Social Gatherings with Friends and Family: Once a week    Attends Religious Services: More than 4 times per year    Active Member of [employer] or Organizations: Yes    Attends Engineer, Structural: More than 4 times per year    Marital Status: Married   Family History  Problem Relation Age of Onset   Heart disease Mother        RHD   Colon cancer Neg Hx    Colon polyps Neg Hx     Esophageal cancer Neg Hx    Rectal cancer Neg Hx    Stomach cancer Neg Hx         Objective:   Physical Exam Vitals reviewed.  Constitutional:      General: She is not in acute distress.    Appearance: She is well-developed.  HENT:     Head: Normocephalic and atraumatic.     Right Ear: Tympanic membrane normal.     Left Ear: Tympanic membrane normal.  Eyes:     Pupils: Pupils are equal, round, and reactive to light.  Neck:     Thyroid: No thyromegaly.  Cardiovascular:     Rate and Rhythm: Normal rate and regular rhythm.     Heart sounds: Normal heart sounds. No murmur heard. Pulmonary:     Effort: Pulmonary effort is normal. No respiratory distress.     Breath sounds: Wheezing present.  Abdominal:     General: Bowel sounds are normal. There is no distension.     Palpations: Abdomen is soft.     Tenderness: There is no abdominal tenderness.  Musculoskeletal:        General: No tenderness. Normal range of motion.     Cervical back: Normal range of motion and neck supple.  Skin:    General: Skin is warm and dry.  Neurological:     Mental Status: She is alert and oriented to person, place, and time.     Cranial Nerves: No cranial nerve deficit.     Deep Tendon Reflexes: Reflexes are normal and symmetric.  Psychiatric:        Behavior: Behavior normal.        Thought Content: Thought content normal.        Judgment: Judgment normal.       BP 120/78   Pulse 80   Temp 98 F (36.7 C) (Temporal)   Ht 5' 2 (1.575 m)   Wt 263 lb 6.4 oz (119.5 kg)   LMP 01/07/2020   SpO2 95%   BMI 48.18 kg/m      Assessment & Plan:  Christy Gay comes in today with chief complaint of Cough (X 5 days negative covid test.), Wheezing, and Shortness of Breath   Diagnosis and orders addressed:  1. Acute bronchitis, unspecified organism (Primary) - Take meds as prescribed - Use a cool mist humidifier  -Use saline nose sprays frequently -Force fluids -For any cough or  congestion  Use plain Mucinex- regular strength or max strength is fine -For fever or aces or pains- take tylenol  or ibuprofen . -Throat lozenges if help -Follow up if symptoms worsen or do not improve  - predniSONE  (STERAPRED UNI-PAK 21 TAB) 10 MG (21) TBPK tablet; Use as directed  Dispense:  21 tablet; Refill: 0 - albuterol  (VENTOLIN  HFA) 108 (90 Base) MCG/ACT inhaler; Inhale 2 puffs into the lungs every 6 (six) hours as needed for wheezing or shortness of breath.  Dispense: 8 g; Refill: 0 - benzonatate (TESSALON) 200 MG capsule; Take 1 capsule (200 mg total) by mouth 2 (two) times daily as needed for cough.  Dispense: 20 capsule; Refill: 0 - promethazine -dextromethorphan (PROMETHAZINE -DM) 6.25-15 MG/5ML syrup; Take 5 mLs by mouth 3 (three) times daily as needed for cough.  Dispense: 118 mL; Refill: 0    Bari Learn, FNP

## 2024-05-16 ENCOUNTER — Other Ambulatory Visit: Payer: Self-pay | Admitting: Family Medicine

## 2024-05-16 DIAGNOSIS — E1169 Type 2 diabetes mellitus with other specified complication: Secondary | ICD-10-CM

## 2024-10-18 ENCOUNTER — Ambulatory Visit: Admitting: Neurology

## 2024-11-22 ENCOUNTER — Encounter: Payer: Self-pay | Admitting: Family Medicine
